# Patient Record
Sex: Female | Born: 1940 | Race: White | Hispanic: No | Marital: Single | Smoking: Former smoker
Health system: Southern US, Community
[De-identification: ages and names within clinical notes are randomized; demographics above are authoritative.]

## PROBLEM LIST (undated history)

## (undated) DIAGNOSIS — I951 Orthostatic hypotension: Secondary | ICD-10-CM

## (undated) DIAGNOSIS — E785 Hyperlipidemia, unspecified: Secondary | ICD-10-CM

## (undated) DIAGNOSIS — I10 Essential (primary) hypertension: Secondary | ICD-10-CM

## (undated) DIAGNOSIS — M7580 Other shoulder lesions, unspecified shoulder: Secondary | ICD-10-CM

## (undated) DIAGNOSIS — R42 Dizziness and giddiness: Secondary | ICD-10-CM

## (undated) DIAGNOSIS — Z923 Personal history of irradiation: Secondary | ICD-10-CM

## (undated) DIAGNOSIS — I5189 Other ill-defined heart diseases: Secondary | ICD-10-CM

## (undated) DIAGNOSIS — C4490 Unspecified malignant neoplasm of skin, unspecified: Secondary | ICD-10-CM

## (undated) DIAGNOSIS — C50919 Malignant neoplasm of unspecified site of unspecified female breast: Secondary | ICD-10-CM

## (undated) DIAGNOSIS — R55 Syncope and collapse: Secondary | ICD-10-CM

## (undated) DIAGNOSIS — Z973 Presence of spectacles and contact lenses: Secondary | ICD-10-CM

## (undated) DIAGNOSIS — M1990 Unspecified osteoarthritis, unspecified site: Secondary | ICD-10-CM

## (undated) DIAGNOSIS — N393 Stress incontinence (female) (male): Secondary | ICD-10-CM

## (undated) DIAGNOSIS — H3552 Pigmentary retinal dystrophy: Secondary | ICD-10-CM

## (undated) HISTORY — DX: Other ill-defined heart diseases: I51.89

## (undated) HISTORY — DX: Dizziness and giddiness: R42

## (undated) HISTORY — DX: Orthostatic hypotension: I95.1

## (undated) HISTORY — DX: Syncope and collapse: R55

## (undated) HISTORY — PX: OTHER SURGICAL HISTORY: SHX169

## (undated) HISTORY — DX: Hyperlipidemia, unspecified: E78.5

## (undated) HISTORY — PX: TONSILLECTOMY: SUR1361

## (undated) HISTORY — DX: Unspecified malignant neoplasm of skin, unspecified: C44.90

---

## 1983-02-15 HISTORY — PX: ABDOMINAL HYSTERECTOMY: SHX81

## 1990-02-14 HISTORY — PX: CHOLECYSTECTOMY: SHX55

## 1999-11-09 ENCOUNTER — Encounter: Payer: Self-pay | Admitting: Emergency Medicine

## 1999-11-09 ENCOUNTER — Emergency Department (HOSPITAL_COMMUNITY): Admission: EM | Admit: 1999-11-09 | Discharge: 1999-11-09 | Payer: Self-pay | Admitting: Emergency Medicine

## 2005-07-22 ENCOUNTER — Encounter: Admission: RE | Admit: 2005-07-22 | Discharge: 2005-07-22 | Payer: Self-pay | Admitting: Cardiovascular Disease

## 2006-06-06 ENCOUNTER — Other Ambulatory Visit: Admission: RE | Admit: 2006-06-06 | Discharge: 2006-06-06 | Payer: Self-pay | Admitting: Internal Medicine

## 2009-01-01 ENCOUNTER — Encounter (INDEPENDENT_AMBULATORY_CARE_PROVIDER_SITE_OTHER): Payer: Self-pay | Admitting: *Deleted

## 2009-01-05 ENCOUNTER — Ambulatory Visit: Payer: Self-pay | Admitting: Internal Medicine

## 2009-01-05 ENCOUNTER — Encounter (INDEPENDENT_AMBULATORY_CARE_PROVIDER_SITE_OTHER): Payer: Self-pay | Admitting: *Deleted

## 2009-01-14 ENCOUNTER — Ambulatory Visit: Payer: Self-pay | Admitting: Internal Medicine

## 2009-01-23 ENCOUNTER — Encounter: Payer: Self-pay | Admitting: Internal Medicine

## 2009-01-28 ENCOUNTER — Emergency Department (HOSPITAL_COMMUNITY): Admission: EM | Admit: 2009-01-28 | Discharge: 2009-01-29 | Payer: Self-pay | Admitting: Emergency Medicine

## 2009-01-28 ENCOUNTER — Telehealth: Payer: Self-pay | Admitting: Internal Medicine

## 2009-07-31 ENCOUNTER — Telehealth: Payer: Self-pay | Admitting: Internal Medicine

## 2009-11-13 ENCOUNTER — Ambulatory Visit: Payer: Self-pay | Admitting: Cardiovascular Disease

## 2010-03-16 NOTE — Progress Notes (Signed)
Summary: med ?'s  Phone Note Call from Patient Call back at Home Phone [phone]   Caller: Patient Call For: Dr. Leone Payor Reason for Call: Talk to Nurse Summary of Call: has questions regarding magnesium glycenate Initial call taken by: Vallarie Mare,  July 31, 2009 9:18 AM  Follow-up for Phone Call        Pt asking if magnesium glycenate will affect her bowels.  Has diarrhea at times.  Pt informed that meds with magnesium can cause diarrhea.  Pt instructed to read label to see if diarrhea is listed as side effect. Follow-up by: Ashok Cordia RN,  July 31, 2009 9:33 AM

## 2010-05-18 LAB — COMPREHENSIVE METABOLIC PANEL
CO2: 29 mEq/L (ref 19–32)
Calcium: 9.9 mg/dL (ref 8.4–10.5)
Creatinine, Ser: 0.7 mg/dL (ref 0.4–1.2)
GFR calc non Af Amer: >60 mL/min (ref >60)
Glucose, Bld: 124 mg/dL — ABNORMAL HIGH (ref 70–99)
Total Bilirubin: 0.7 mg/dL (ref 0.3–1.2)

## 2010-05-18 LAB — DIFFERENTIAL
Basophils Absolute: 0.1 10*3/uL (ref 0.0–0.1)
Basophils Relative: 0 % (ref 0–1)
Eosinophils Relative: 1 % (ref 0–5)
Lymphocytes Relative: 15 % (ref 12–46)
Monocytes Absolute: 1 10*3/uL (ref 0.1–1.0)

## 2010-05-18 LAB — URINALYSIS, ROUTINE W REFLEX MICROSCOPIC
Bilirubin Urine: NEGATIVE
Ketones, ur: NEGATIVE mg/dL
Nitrite: NEGATIVE
Protein, ur: NEGATIVE mg/dL
Specific Gravity, Urine: 1.02 (ref 1.005–1.030)
Urobilinogen, UA: 0.2 mg/dL (ref 0.0–1.0)

## 2010-05-18 LAB — CBC
HCT: 42.4 % (ref 36.0–46.0)
Hemoglobin: 14.3 g/dL (ref 12.0–15.0)
MCHC: 33.8 g/dL (ref 30.0–36.0)
MCV: 92.1 fL (ref 78.0–100.0)
RBC: 4.6 MIL/uL (ref 3.87–5.11)

## 2010-05-18 LAB — URINE CULTURE: Culture: NO GROWTH

## 2010-05-18 LAB — GLUCOSE, CAPILLARY
Glucose-Capillary: 119 mg/dL — ABNORMAL HIGH (ref 70–99)
Glucose-Capillary: 80 mg/dL (ref 70–99)

## 2010-11-09 ENCOUNTER — Encounter: Payer: Self-pay | Admitting: Cardiovascular Disease

## 2010-11-10 ENCOUNTER — Encounter: Payer: Self-pay | Admitting: Cardiovascular Disease

## 2010-11-23 ENCOUNTER — Ambulatory Visit: Payer: Self-pay | Admitting: Cardiovascular Disease

## 2010-12-31 ENCOUNTER — Encounter: Payer: Self-pay | Admitting: Cardiovascular Disease

## 2010-12-31 ENCOUNTER — Ambulatory Visit (INDEPENDENT_AMBULATORY_CARE_PROVIDER_SITE_OTHER): Payer: Self-pay | Admitting: Cardiovascular Disease

## 2010-12-31 VITALS — BP 144/85 | HR 81 | Ht 66.0 in | Wt 204.8 lb

## 2010-12-31 DIAGNOSIS — I5030 Unspecified diastolic (congestive) heart failure: Secondary | ICD-10-CM

## 2010-12-31 DIAGNOSIS — I509 Heart failure, unspecified: Secondary | ICD-10-CM

## 2010-12-31 DIAGNOSIS — I5032 Chronic diastolic (congestive) heart failure: Secondary | ICD-10-CM | POA: Insufficient documentation

## 2010-12-31 NOTE — Patient Instructions (Signed)
Your physician wants you to follow-up in: 1 year, You will receive a reminder letter in the mail two months in advance. If you don't receive a letter, please call our office to schedule the follow-up appointment. 

## 2010-12-31 NOTE — Progress Notes (Signed)
Angela Rosario Date of Birth  1940-12-17 [facility] [address]    [address] [place], [place]  [address] [phone]  Fax  [phone]  History of Present Illness:  Angela Rosario is a 70 year old female with a history of diastolic dysfunction and diabetes mellitus. She's had some episodes of orthostatic hypotension.  She'll begin a getting a hip replacement in January.  She has not been able to exercise because of her hip but otherwise she seems to be doing fairly well. She denies any chest pain or shortness of breath.    Current Outpatient Prescriptions on File Prior to Visit  Medication Sig Dispense Refill  . carvedilol (COREG) 6.25 MG tablet Take 6.25 mg by mouth 2 (two) times daily with a meal.        . glipiZIDE-metformin (METAGLIP) 2.5-500 MG per tablet Take 1 tablet by mouth daily.        . Multiple Vitamin (MULTI-VITAMIN PO) Take by mouth daily.        . Omega-3 Fatty Acids (FISH OIL PO) Take by mouth 2 (two) times daily.          No Known Allergies  Past Medical History  Diagnosis Date  . Syncope   . Diastolic dysfunction   . Diabetes mellitus   . Hyperlipidemia   . Episode of dizziness     Mild episodes  . Orthostatic hypotension     Some episodes    Past Surgical History  Procedure Date  . Other surgical history     Hysterectomy  . Cholecystectomy     History  Smoking status  . Never Smoker   Smokeless tobacco  . Not on file    History  Alcohol Use No    No family history on file.  Reviw of Systems:  Reviewed in the HPI.  All other systems are negative.  Physical Exam: BP 144/85  Pulse 81  Ht 5\' 6"  (1.676 m)  Wt 204 lb 12.8 oz (92.897 kg)  BMI 33.06 kg/m2 The patient is alert and oriented x 3.  The mood and affect are normal.   Skin: warm and dry.  Color is normal.    HEENT:   [place]/AT, no JVD  Lungs: clear   Heart: RR,    Abdomen: soft, + BS  Extremities:  No c/c/e  Neuro:  Non focal    ECG: NSR, rare PVCs, left ant.  fascicular block  Assessment / Plan:

## 2010-12-31 NOTE — Assessment & Plan Note (Signed)
Angela Rosario has done very well from a cardiac standpoint. She has known diastolic dysfunction but has been doing very well. Her blood pressures been well controlled. She will remain on the carvedilol.  She's scheduled to have hip surgery in January. She is at low risk for any cardiovascular consultations during her hip surgery. We will not need to see her again prior to hip surgery unless she has additional issues.

## 2011-01-28 ENCOUNTER — Other Ambulatory Visit: Payer: Self-pay | Admitting: Orthopedic Surgery

## 2011-01-28 NOTE — H&P (Signed)
Angela Rosario  DOB: 01/15/1941  Date Of Admission: 02/22/2010  Chief Complaint:  Left Hip Pain  History of Present Illness The patient is a 70 year old female who comes in today for a preoperative History and Physical. The patient is scheduled for a left total hip arthroplasty to be performed by Dr. Gus Rankin. Aluisio, MD at [facility] on 02/23/2011. Angela Rosario is a 70 year old female in today for evaluation of her left hip. She saw Dr. Ranell Patrick about a year and a half ago and was told that she had hip arthritis at that time. She states that the hip has gotten progressively worse over time. The pain is just in the left hip. She does not have any right hip pain. The pain is in her groin radiating down to her thigh to her knee. She is not having any back pain with this. She is not having lower extremity weakness or paresthesia. She does not have any right hip pain at this time. The hip is definitely limiting what she can and cannot do. It is hurting her at night. It has gotten progressively worse over the past several months. She would like to procede with total hip replacement. They have been treated conservatively in the past for the above stated problem and despite conservative measures, they continue to have progressive pain and severe functional limitations and dysfunction. They have failed non-operative management. It is felt that they would benefit from undergoing total joint replacement. Risks and benefits of the procedure have been discussed with the patient and they elect to proceed with surgery. There are no active contraindications to surgery such as ongoing infection or rapidly progressive neurological disease.  Allergies No Known Drug Allergies.   Medications Alpha-Lipoic Acid 600 MG CAPS carvedilol (COREG) 6.25 MG table  Coenzyme Q10 (CO Q10) 100 MG TABS  Fesoterodine Fumarate (TOVIAZ) 8 MG TB24  glipiZIDE-metformin (METAGLIP) 2.5-500 MG per tablet  Glucosamine-Chondroit-Vit C-Mn (GLUCOSAMINE-CHONDROITIN) TABS  lovastatin (MEVACOR) 10 MG tablet  meloxicam (MOBIC) 15 MG tablet  Multiple Vitamin (MULTI-VITAMIN PO)  Omega-3 Fatty Acids (FISH OIL PO)  Past Medical Urinary Incontinence Diabetes Mellitus, Type II Diverticulitis Of Colon Hypercholesterolemia Skin Cancer  Past Surgical History Gallbladder Surgery. Date: 43. laporoscopic Hysterectomy. Date: 2. partial (non-cancerous)  Family History Diabetes Mellitus. mother Osteoarthritis. mother Osteoporosis. mother  Social History Alcohol use. never consumed alcohol Children. 2 Current work status. retired Financial planner (Currently). no Drug/Alcohol Rehab (Previously). no Exercise. Exercises daily; does running / walking Illicit drug use. no Living situation. live alone Marital status. divorced Number of flights of stairs before winded. 2-3 Pain Contract. no Tobacco / smoke exposure. no Tobacco use. never smoker Post-Surgical Plans. Wants to look into [facility]. Advance Directives. Living Will and Healthcare POA  Review of Systems General:Not Present- Chills, Fever, Night Sweats, Appetite Loss, Fatigue, Feeling sick, Weight Gain and Weight Loss. Skin:Not Present- Itching, Rash, Skin Color Changes, Ulcer, Psoriasis and Change in Hair or Nails. HEENT:Not Present- Sensitivity to light, Hearing problems, Nose Bleed and Ringing in the Ears. Neck:Not Present- Swollen Glands and Neck Mass. Respiratory:Not Present- Snoring, Chronic Cough, Bloody sputum and Dyspnea. Cardiovascular:Not Present- Shortness of Breath, Chest Pain, Swelling of Extremities, Leg Cramps and Palpitations. Gastrointestinal:Not Present- Bloody Stool, Heartburn, Abdominal Pain, Vomiting, Nausea and Incontinence of Stool. Female Genitourinary:Not Present- Blood in Urine, Menstrual Irregularities, Frequency, Incontinence and Nocturia. Musculoskeletal:Not Present-  Muscle Weakness, Muscle Pain, Joint Stiffness, Joint Swelling, Joint Pain and Back Pain. Neurological:Not Present- Tingling, Numbness, Burning, Tremor, Headaches  and Dizziness. Psychiatric:Not Present- Anxiety, Depression and Memory Loss. Endocrine:Not Present- Cold Intolerance, Heat Intolerance, Excessive hunger and Excessive Thirst. Hematology:Not Present- Abnormal Bleeding, Anemia, Blood Clots and Easy Bruising.   Vitals Weight: 200 lb Height: 66 in Body Surface Area: 2.06 m Body Mass Index: 32.28 kg/m Pulse: 72 (Regular) Resp.: 14 (Unlabored) BP: 142/82 (Sitting, Left Arm, Standard)  Physical Exam The physical exam findings are as follows:  General Mental Status - Alert, cooperative and good historian. General Appearance- pleasant. Not in acute distress. Orientation- Oriented X3. Build & Nutrition- Well nourished and Well developed.  Head and Neck Head- normocephalic, atraumatic . Neck Global Assessment- supple. no bruit auscultated on the right and no bruit auscultated on the left.  Eye Pupil- Bilateral- Regular and Round. Motion- Bilateral- EOMI.  Chest and Lung Exam Auscultation: Breath sounds:- clear at anterior chest wall and - clear at posterior chest wall. Adventitious sounds:- No Adventitious sounds.  Cardiovascular Auscultation:Rhythm- Regular rate and rhythm. Heart Sounds- S1 WNL and S2 WNL. Murmurs & Other Heart Sounds:Auscultation of the heart reveals - No Murmurs.  Abdomen Palpation/Percussion:Tenderness- Abdomen is non-tender to palpation. Rigidity (guarding)- Abdomen is soft. Auscultation:Auscultation of the abdomen reveals - Bowel sounds normal.   Female Genitourinary Not done, not pertinent to present illness  Musculoskeletal Evaluation of her right hip normal range of motion and no discomfort. Left hip flexion about 90. No internal rotation about 10 degrees external rotation, 20 degrees  abduction.  RADIOGRAPHS: AP pelvis and lateral of the left hip show that the right hip has no evidence of arthritis. Left hip has pretty significant joint space narrowing. She has large osteophyte formation. She is very close to completely bone on bone but not fully there.  Assessment & Plan Osteoarthritis Left Hip  Note: Patient is for a Left Total Hip Replacement by Dr. Lequita Halt.  Patient wants to look into [facility] after the hospital stay.  Avel Peace, PA-C

## 2011-02-04 ENCOUNTER — Encounter (HOSPITAL_COMMUNITY): Payer: Self-pay

## 2011-02-17 ENCOUNTER — Encounter (HOSPITAL_COMMUNITY): Payer: Self-pay

## 2011-02-17 ENCOUNTER — Ambulatory Visit (HOSPITAL_COMMUNITY)
Admission: RE | Admit: 2011-02-17 | Discharge: 2011-02-17 | Disposition: A | Discharge status: Home / Self Care | Payer: Medicare Other | Source: Ambulatory Visit | Attending: Orthopedic Surgery | Admitting: Orthopedic Surgery

## 2011-02-17 ENCOUNTER — Encounter (HOSPITAL_COMMUNITY)
Admission: RE | Admit: 2011-02-17 | Discharge: 2011-02-17 | Disposition: A | Discharge status: Home / Self Care | Payer: Medicare Other | Source: Ambulatory Visit | Attending: Orthopedic Surgery | Admitting: Orthopedic Surgery

## 2011-02-17 DIAGNOSIS — M169 Osteoarthritis of hip, unspecified: Secondary | ICD-10-CM | POA: Insufficient documentation

## 2011-02-17 DIAGNOSIS — Z01818 Encounter for other preprocedural examination: Secondary | ICD-10-CM | POA: Insufficient documentation

## 2011-02-17 DIAGNOSIS — Z01812 Encounter for preprocedural laboratory examination: Secondary | ICD-10-CM | POA: Insufficient documentation

## 2011-02-17 DIAGNOSIS — M1610 Unilateral primary osteoarthritis, unspecified hip: Secondary | ICD-10-CM | POA: Insufficient documentation

## 2011-02-17 HISTORY — DX: Unspecified osteoarthritis, unspecified site: M19.90

## 2011-02-17 HISTORY — DX: Pigmentary retinal dystrophy: H35.52

## 2011-02-17 HISTORY — DX: Stress incontinence (female) (male): N39.3

## 2011-02-17 HISTORY — DX: Other shoulder lesions, unspecified shoulder: M75.80

## 2011-02-17 LAB — COMPREHENSIVE METABOLIC PANEL
AST: 26 U/L (ref 0–37)
Alkaline Phosphatase: 77 U/L (ref 39–117)
CO2: 28 mEq/L (ref 19–32)
Chloride: 101 mEq/L (ref 96–112)
Creatinine, Ser: 0.78 mg/dL (ref 0.50–1.10)
GFR calc non Af Amer: 82 mL/min — ABNORMAL LOW (ref >90)
Potassium: 4 mEq/L (ref 3.5–5.1)
Total Bilirubin: 0.5 mg/dL (ref 0.3–1.2)

## 2011-02-17 LAB — URINALYSIS, ROUTINE W REFLEX MICROSCOPIC
Bilirubin Urine: NEGATIVE
Glucose, UA: NEGATIVE mg/dL
Hgb urine dipstick: NEGATIVE
Specific Gravity, Urine: 1.025 (ref 1.005–1.030)
Urobilinogen, UA: 1 mg/dL (ref 0.0–1.0)
pH: 6.5 (ref 5.0–8.0)

## 2011-02-17 LAB — CBC
HCT: 42.8 % (ref 36.0–46.0)
MCV: 91.1 fL (ref 78.0–100.0)
Platelets: 288 10*3/uL (ref 150–400)
RBC: 4.7 MIL/uL (ref 3.87–5.11)
WBC: 7.8 10*3/uL (ref 4.0–10.5)

## 2011-02-17 LAB — DIFFERENTIAL
Lymphocytes Relative: 31 % (ref 12–46)
Lymphs Abs: 2.5 10*3/uL (ref 0.7–4.0)
Monocytes Absolute: 0.7 10*3/uL (ref 0.1–1.0)
Monocytes Relative: 8 % (ref 3–12)
Neutro Abs: 4.5 10*3/uL (ref 1.7–7.7)
Neutrophils Relative %: 57 % (ref 43–77)

## 2011-02-17 LAB — URINE MICROSCOPIC-ADD ON

## 2011-02-17 LAB — PROTIME-INR: INR: 0.94 (ref 0.00–1.49)

## 2011-02-17 LAB — SURGICAL PCR SCREEN: MRSA, PCR: NEGATIVE

## 2011-02-17 NOTE — Patient Instructions (Addendum)
20 Jovi Alvizo  02/17/2011   Your procedure is scheduled on:  02-23-2011  Report to [facility] at 0630 AM.  Call this number if you have problems the morning of surgery: [phone]   Remember:   Do not eat food:After Midnight.  May have clear liquids:until Midnight .  Marland Kitchen  Take these medicines the morning of surgery with A SIP OF WATER: cardvedilol, es tylenol if needed   Do not wear jewelry, make-up or nail polish.  Do not wear lotions, powders, or perfumes.   do not bring valuables to the hospital.  Contacts, dentures or bridgework may not be worn into surgery.  Leave suitcase in the car. After surgery it may be brought to your room.  For patients admitted to the hospital, checkout time is 11:00 AM the day of discharge.     Special Instructions: hibiclens shower night before and morning of surgery, use from neck down avoid private area, no shaving 2 days before showers Cain Sieve, rn [facility] pre op nurse phone number [phone]  Please read over the following fact sheets that you were given: MRSA Information, blood fact sheet

## 2011-02-17 NOTE — Pre-Procedure Instructions (Signed)
ekg and  Cardiac clearance note dr Melburn Popper 12-31-2010 in epic

## 2011-02-22 MED ORDER — BUPIVACAINE 0.25 % ON-Q PUMP SINGLE CATH 300ML
300.0000 mL | INJECTION | Status: DC
  Filled 2011-02-22: qty 300

## 2011-02-23 ENCOUNTER — Inpatient Hospital Stay (HOSPITAL_COMMUNITY): Payer: Medicare Other | Admitting: Anesthesiology

## 2011-02-23 ENCOUNTER — Encounter (HOSPITAL_COMMUNITY): Payer: Self-pay | Admitting: *Deleted

## 2011-02-23 ENCOUNTER — Inpatient Hospital Stay (HOSPITAL_COMMUNITY): Payer: Medicare Other

## 2011-02-23 ENCOUNTER — Encounter (HOSPITAL_COMMUNITY)
Admission: RE | Disposition: A | Discharge status: Skilled Nursing Facility | Payer: Self-pay | Source: Ambulatory Visit | Attending: Orthopedic Surgery

## 2011-02-23 ENCOUNTER — Inpatient Hospital Stay (HOSPITAL_COMMUNITY)
Admission: RE | Admit: 2011-02-23 | Discharge: 2011-02-26 | DRG: 470 | Disposition: A | Discharge status: Skilled Nursing Facility | Payer: Medicare Other | Source: Ambulatory Visit | Attending: Orthopedic Surgery | Admitting: Orthopedic Surgery

## 2011-02-23 ENCOUNTER — Encounter (HOSPITAL_COMMUNITY): Payer: Self-pay | Admitting: Anesthesiology

## 2011-02-23 DIAGNOSIS — Z96649 Presence of unspecified artificial hip joint: Secondary | ICD-10-CM

## 2011-02-23 DIAGNOSIS — M1610 Unilateral primary osteoarthritis, unspecified hip: Principal | ICD-10-CM | POA: Diagnosis present

## 2011-02-23 DIAGNOSIS — E119 Type 2 diabetes mellitus without complications: Secondary | ICD-10-CM | POA: Diagnosis present

## 2011-02-23 DIAGNOSIS — M169 Osteoarthritis of hip, unspecified: Secondary | ICD-10-CM | POA: Diagnosis present

## 2011-02-23 DIAGNOSIS — E871 Hypo-osmolality and hyponatremia: Secondary | ICD-10-CM | POA: Diagnosis not present

## 2011-02-23 HISTORY — PX: TOTAL HIP ARTHROPLASTY: SHX124

## 2011-02-23 LAB — TYPE AND SCREEN
ABO/RH(D): O POS
Antibody Screen: NEGATIVE

## 2011-02-23 LAB — GLUCOSE, CAPILLARY
Glucose-Capillary: 204 mg/dL — ABNORMAL HIGH (ref 70–99)
Glucose-Capillary: 218 mg/dL — ABNORMAL HIGH (ref 70–99)

## 2011-02-23 SURGERY — ARTHROPLASTY, HIP, TOTAL,POSTERIOR APPROACH
Anesthesia: General | Site: Hip | Laterality: Left | Wound class: Clean

## 2011-02-23 MED ORDER — SODIUM CHLORIDE 0.9 % IV SOLN
INTRAVENOUS | Status: DC
  Administered 2011-02-23 – 2011-02-24 (×2): via INTRAVENOUS

## 2011-02-23 MED ORDER — METOCLOPRAMIDE HCL 5 MG/ML IJ SOLN: 5.0000 mg | Freq: Three times a day (TID) | INTRAMUSCULAR | Status: DC | PRN

## 2011-02-23 MED ORDER — HYDROMORPHONE HCL PF 1 MG/ML IJ SOLN
0.2500 mg | INTRAMUSCULAR | Status: DC | PRN
  Administered 2011-02-23 (×3): 0.5 mg via INTRAVENOUS

## 2011-02-23 MED ORDER — POLYETHYLENE GLYCOL 3350 17 G PO PACK
17.0000 g | PACK | Freq: Every day | ORAL | Status: DC | PRN
  Filled 2011-02-23: qty 1

## 2011-02-23 MED ORDER — ACETAMINOPHEN 10 MG/ML IV SOLN
1000.0000 mg | Freq: Four times a day (QID) | INTRAVENOUS | Status: AC
  Administered 2011-02-23 – 2011-02-24 (×4): 1000 mg via INTRAVENOUS
  Filled 2011-02-23 (×4): qty 100

## 2011-02-23 MED ORDER — METOCLOPRAMIDE HCL 10 MG PO TABS: 5.0000 mg | ORAL_TABLET | Freq: Three times a day (TID) | ORAL | Status: DC | PRN

## 2011-02-23 MED ORDER — TEMAZEPAM 15 MG PO CAPS: 15.0000 mg | ORAL_CAPSULE | Freq: Every evening | ORAL | Status: DC | PRN

## 2011-02-23 MED ORDER — DIPHENHYDRAMINE HCL 12.5 MG/5ML PO ELIX: 12.5000 mg | ORAL_SOLUTION | ORAL | Status: DC | PRN

## 2011-02-23 MED ORDER — PROMETHAZINE HCL 25 MG/ML IJ SOLN: 6.2500 mg | INTRAMUSCULAR | Status: DC | PRN

## 2011-02-23 MED ORDER — BUPIVACAINE LIPOSOME 1.3 % IJ SUSP
20.0000 mL | Freq: Once | INTRAMUSCULAR | Status: AC
  Administered 2011-02-23: 20 mL
  Filled 2011-02-23: qty 20

## 2011-02-23 MED ORDER — FLEET ENEMA 7-19 GM/118ML RE ENEM: 1.0000 | ENEMA | Freq: Once | RECTAL | Status: AC | PRN

## 2011-02-23 MED ORDER — ONDANSETRON HCL 4 MG PO TABS: 4.0000 mg | ORAL_TABLET | Freq: Four times a day (QID) | ORAL | Status: DC | PRN

## 2011-02-23 MED ORDER — ACETAMINOPHEN 10 MG/ML IV SOLN
INTRAVENOUS | Status: DC | PRN
  Administered 2011-02-23: 1000 mg via INTRAVENOUS

## 2011-02-23 MED ORDER — FENTANYL CITRATE 0.05 MG/ML IJ SOLN
INTRAMUSCULAR | Status: DC | PRN
  Administered 2011-02-23: 50 ug via INTRAVENOUS
  Administered 2011-02-23: 100 ug via INTRAVENOUS
  Administered 2011-02-23 (×2): 50 ug via INTRAVENOUS

## 2011-02-23 MED ORDER — HYDROMORPHONE HCL PF 1 MG/ML IJ SOLN
INTRAMUSCULAR | Status: AC
  Filled 2011-02-23: qty 1

## 2011-02-23 MED ORDER — SIMVASTATIN 5 MG PO TABS
5.0000 mg | ORAL_TABLET | Freq: Every day | ORAL | Status: DC
  Administered 2011-02-23 – 2011-02-24 (×2): 5 mg via ORAL
  Filled 2011-02-23 (×4): qty 1

## 2011-02-23 MED ORDER — MORPHINE SULFATE 2 MG/ML IJ SOLN
1.0000 mg | INTRAMUSCULAR | Status: DC | PRN
  Administered 2011-02-23 – 2011-02-25 (×2): 2 mg via INTRAVENOUS
  Filled 2011-02-23 (×2): qty 1

## 2011-02-23 MED ORDER — MIDAZOLAM HCL 5 MG/5ML IJ SOLN
INTRAMUSCULAR | Status: DC | PRN
  Administered 2011-02-23: 2 mg via INTRAVENOUS

## 2011-02-23 MED ORDER — ONDANSETRON HCL 4 MG/2ML IJ SOLN
INTRAMUSCULAR | Status: DC | PRN
  Administered 2011-02-23: 4 mg via INTRAVENOUS

## 2011-02-23 MED ORDER — METHOCARBAMOL 500 MG PO TABS
500.0000 mg | ORAL_TABLET | Freq: Four times a day (QID) | ORAL | Status: DC | PRN
  Administered 2011-02-23 – 2011-02-26 (×8): 500 mg via ORAL
  Filled 2011-02-23 (×8): qty 1

## 2011-02-23 MED ORDER — SUCCINYLCHOLINE CHLORIDE 20 MG/ML IJ SOLN
INTRAMUSCULAR | Status: DC | PRN
  Administered 2011-02-23: 100 mg via INTRAVENOUS

## 2011-02-23 MED ORDER — CARVEDILOL 6.25 MG PO TABS
6.2500 mg | ORAL_TABLET | Freq: Two times a day (BID) | ORAL | Status: DC
  Administered 2011-02-23 – 2011-02-26 (×5): 6.25 mg via ORAL
  Filled 2011-02-23 (×7): qty 1

## 2011-02-23 MED ORDER — METHOCARBAMOL 100 MG/ML IJ SOLN
500.0000 mg | Freq: Four times a day (QID) | INTRAMUSCULAR | Status: DC | PRN
  Administered 2011-02-23: 500 mg via INTRAVENOUS
  Filled 2011-02-23: qty 5

## 2011-02-23 MED ORDER — PHENOL 1.4 % MT LIQD: 1.0000 | OROMUCOSAL | Status: DC | PRN

## 2011-02-23 MED ORDER — INSULIN ASPART 100 UNIT/ML ~~LOC~~ SOLN
SUBCUTANEOUS | Status: AC
  Administered 2011-02-23: 5 [IU] via SUBCUTANEOUS
  Filled 2011-02-23: qty 1

## 2011-02-23 MED ORDER — CARVEDILOL 6.25 MG PO TABS
6.2500 mg | ORAL_TABLET | ORAL | Status: AC
  Administered 2011-02-23: 6.25 mg via ORAL
  Filled 2011-02-23: qty 1

## 2011-02-23 MED ORDER — INSULIN ASPART 100 UNIT/ML ~~LOC~~ SOLN: 0.0000 [IU] | SUBCUTANEOUS | Status: DC

## 2011-02-23 MED ORDER — OXYCODONE HCL 5 MG PO TABS
5.0000 mg | ORAL_TABLET | ORAL | Status: DC | PRN
  Administered 2011-02-23: 5 mg via ORAL
  Administered 2011-02-24: 10 mg via ORAL
  Administered 2011-02-24: 5 mg via ORAL
  Administered 2011-02-24 (×2): 10 mg via ORAL
  Filled 2011-02-23: qty 1
  Filled 2011-02-23: qty 2
  Filled 2011-02-23 (×3): qty 1
  Filled 2011-02-23: qty 2

## 2011-02-23 MED ORDER — BISACODYL 10 MG RE SUPP: 10.0000 mg | Freq: Every day | RECTAL | Status: DC | PRN

## 2011-02-23 MED ORDER — GLIPIZIDE 2.5 MG HALF TABLET
2.5000 mg | ORAL_TABLET | Freq: Every day | ORAL | Status: DC
  Administered 2011-02-24 – 2011-02-26 (×3): 2.5 mg via ORAL
  Filled 2011-02-23 (×3): qty 1

## 2011-02-23 MED ORDER — ACETAMINOPHEN 325 MG PO TABS
650.0000 mg | ORAL_TABLET | Freq: Four times a day (QID) | ORAL | Status: DC | PRN
  Administered 2011-02-25: 650 mg via ORAL
  Filled 2011-02-23: qty 2

## 2011-02-23 MED ORDER — CEFAZOLIN SODIUM 1-5 GM-% IV SOLN
1.0000 g | Freq: Four times a day (QID) | INTRAVENOUS | Status: AC
  Administered 2011-02-23 – 2011-02-24 (×3): 1 g via INTRAVENOUS
  Filled 2011-02-23 (×3): qty 50

## 2011-02-23 MED ORDER — GLIPIZIDE-METFORMIN HCL 2.5-500 MG PO TABS: 1.0000 | ORAL_TABLET | ORAL | Status: DC

## 2011-02-23 MED ORDER — ROCURONIUM BROMIDE 100 MG/10ML IV SOLN
INTRAVENOUS | Status: DC | PRN
  Administered 2011-02-23: 10 mg via INTRAVENOUS

## 2011-02-23 MED ORDER — MENTHOL 3 MG MT LOZG: 1.0000 | LOZENGE | OROMUCOSAL | Status: DC | PRN

## 2011-02-23 MED ORDER — METFORMIN HCL 500 MG PO TABS
500.0000 mg | ORAL_TABLET | Freq: Every day | ORAL | Status: DC
  Administered 2011-02-24: 500 mg via ORAL
  Filled 2011-02-23: qty 1

## 2011-02-23 MED ORDER — CEFAZOLIN SODIUM-DEXTROSE 2-3 GM-% IV SOLR
2.0000 g | Freq: Once | INTRAVENOUS | Status: AC
  Administered 2011-02-23: 2 g via INTRAVENOUS

## 2011-02-23 MED ORDER — RIVAROXABAN 10 MG PO TABS
10.0000 mg | ORAL_TABLET | Freq: Every day | ORAL | Status: DC
  Administered 2011-02-24 – 2011-02-26 (×3): 10 mg via ORAL
  Filled 2011-02-23 (×3): qty 1

## 2011-02-23 MED ORDER — ACETAMINOPHEN 650 MG RE SUPP: 650.0000 mg | Freq: Four times a day (QID) | RECTAL | Status: DC | PRN

## 2011-02-23 MED ORDER — ONDANSETRON HCL 4 MG/2ML IJ SOLN: 4.0000 mg | Freq: Four times a day (QID) | INTRAMUSCULAR | Status: DC | PRN

## 2011-02-23 MED ORDER — LACTATED RINGERS IV SOLN
INTRAVENOUS | Status: DC | PRN
  Administered 2011-02-23 (×2): via INTRAVENOUS

## 2011-02-23 MED ORDER — DOCUSATE SODIUM 100 MG PO CAPS
100.0000 mg | ORAL_CAPSULE | Freq: Two times a day (BID) | ORAL | Status: DC
  Administered 2011-02-24 – 2011-02-26 (×4): 100 mg via ORAL
  Filled 2011-02-23 (×8): qty 1

## 2011-02-23 MED ORDER — INSULIN ASPART 100 UNIT/ML ~~LOC~~ SOLN
0.0000 [IU] | Freq: Three times a day (TID) | SUBCUTANEOUS | Status: DC
  Administered 2011-02-23 – 2011-02-24 (×2): 5 [IU] via SUBCUTANEOUS
  Administered 2011-02-24: 3 [IU] via SUBCUTANEOUS
  Administered 2011-02-24: 2 [IU] via SUBCUTANEOUS
  Administered 2011-02-25: 5 [IU] via SUBCUTANEOUS
  Administered 2011-02-25 – 2011-02-26 (×3): 3 [IU] via SUBCUTANEOUS
  Filled 2011-02-23: qty 3

## 2011-02-23 MED ORDER — PROPOFOL 10 MG/ML IV BOLUS
INTRAVENOUS | Status: DC | PRN
  Administered 2011-02-23: 120 mg via INTRAVENOUS

## 2011-02-23 SURGICAL SUPPLY — 51 items
BAG SPEC THK2 15X12 ZIP CLS (MISCELLANEOUS) ×1
BAG ZIPLOCK 12X15 (MISCELLANEOUS) ×2 IMPLANT
BIT DRILL 2.8X128 (BIT) ×2 IMPLANT
BLADE EXTENDED COATED 6.5IN (ELECTRODE) ×2 IMPLANT
BLADE SAW SAG 73X25 THK (BLADE) ×1
BLADE SAW SGTL 73X25 THK (BLADE) ×1 IMPLANT
CLOSURE STERI STRIP 1/2 X4 (GAUZE/BANDAGES/DRESSINGS) ×1 IMPLANT
CLOTH BEACON ORANGE TIMEOUT ST (SAFETY) ×2 IMPLANT
DECANTER SPIKE VIAL GLASS SM (MISCELLANEOUS) ×2 IMPLANT
DRAPE INCISE IOBAN 66X45 STRL (DRAPES) ×2 IMPLANT
DRAPE ORTHO SPLIT 77X108 STRL (DRAPES) ×4
DRAPE POUCH INSTRU U-SHP 10X18 (DRAPES) ×2 IMPLANT
DRAPE SURG ORHT 6 SPLT 77X108 (DRAPES) ×2 IMPLANT
DRAPE U-SHAPE 47X51 STRL (DRAPES) ×2 IMPLANT
DRSG ADAPTIC 3X8 NADH LF (GAUZE/BANDAGES/DRESSINGS) ×2 IMPLANT
DRSG MEPILEX BORDER 4X4 (GAUZE/BANDAGES/DRESSINGS) ×2 IMPLANT
DRSG MEPILEX BORDER 4X8 (GAUZE/BANDAGES/DRESSINGS) ×2 IMPLANT
DURAPREP 26ML APPLICATOR (WOUND CARE) ×2 IMPLANT
ELECT REM PT RETURN 9FT ADLT (ELECTROSURGICAL) ×2
ELECTRODE REM PT RTRN 9FT ADLT (ELECTROSURGICAL) ×1 IMPLANT
EVACUATOR 1/8 PVC DRAIN (DRAIN) ×2 IMPLANT
FACESHIELD LNG OPTICON STERILE (SAFETY) ×8 IMPLANT
GLOVE BIO SURGEON STRL SZ7.5 (GLOVE) ×2 IMPLANT
GLOVE BIO SURGEON STRL SZ8 (GLOVE) ×2 IMPLANT
GLOVE BIOGEL PI IND STRL 8 (GLOVE) ×2 IMPLANT
GLOVE BIOGEL PI INDICATOR 8 (GLOVE) ×2
GOWN STRL NON-REIN LRG LVL3 (GOWN DISPOSABLE) ×2 IMPLANT
GOWN STRL REIN XL XLG (GOWN DISPOSABLE) ×2 IMPLANT
IMMOBILIZER KNEE 20 (SOFTGOODS) ×2
IMMOBILIZER KNEE 20 THIGH 36 (SOFTGOODS) IMPLANT
KIT BASIN OR (CUSTOM PROCEDURE TRAY) ×2 IMPLANT
MANIFOLD NEPTUNE II (INSTRUMENTS) ×2 IMPLANT
NDL SAFETY ECLIPSE 18X1.5 (NEEDLE) ×1 IMPLANT
NEEDLE HYPO 18GX1.5 SHARP (NEEDLE) ×2
NS IRRIG 1000ML POUR BTL (IV SOLUTION) ×2 IMPLANT
PACK TOTAL JOINT (CUSTOM PROCEDURE TRAY) ×2 IMPLANT
PASSER SUT SWANSON 36MM LOOP (INSTRUMENTS) ×2 IMPLANT
POSITIONER SURGICAL ARM (MISCELLANEOUS) ×2 IMPLANT
SPONGE GAUZE 4X4 12PLY (GAUZE/BANDAGES/DRESSINGS) ×2 IMPLANT
STRIP CLOSURE SKIN 1/2X4 (GAUZE/BANDAGES/DRESSINGS) ×4 IMPLANT
SUT ETHIBOND NAB CT1 #1 30IN (SUTURE) ×4 IMPLANT
SUT MNCRL AB 4-0 PS2 18 (SUTURE) ×2 IMPLANT
SUT VIC AB 1 CT1 27 (SUTURE) ×6
SUT VIC AB 1 CT1 27XBRD ANTBC (SUTURE) ×3 IMPLANT
SUT VIC AB 2-0 CT1 27 (SUTURE) ×6
SUT VIC AB 2-0 CT1 TAPERPNT 27 (SUTURE) ×3 IMPLANT
SYR 50ML LL SCALE MARK (SYRINGE) ×2 IMPLANT
TOWEL OR 17X26 10 PK STRL BLUE (TOWEL DISPOSABLE) ×4 IMPLANT
TOWEL OR NON WOVEN STRL DISP B (DISPOSABLE) ×2 IMPLANT
TRAY FOLEY CATH 14FRSI W/METER (CATHETERS) ×2 IMPLANT
WATER STERILE IRR 1500ML POUR (IV SOLUTION) ×2 IMPLANT

## 2011-02-23 NOTE — Op Note (Signed)
Pre-operative diagnosis- Osteoarthritis Left hip  Post-operative diagnosis- Osteoarthritis  Left hip  Procedure-  LeftTotal Hip Arthroplasty  Surgeon- Gus Rankin. Adnan Vanvoorhis, MD  Assistant- Avel Peace, PA-C   Anesthesia  General  EBL- 500   Drain Hemovac   Complication- None  Condition-PACU - hemodynamically stable.   Brief Clinical Note-  Angela Rosario is a 71 y.o. female with end stage arthritis of her left hip with progressively worsening pain and dysfunction. Pain occurs with activity and rest including pain at night. She has tried analgesics, protected weight bearing and rest without benefit. Pain is too severe to attempt physical therapy. Radiographs demonstrate bone on bone arthritis with subchondral cyst formation. She presents now for left THA.  Procedure in detail-   The patient is brought into the operating room and placed on the operating table. After successful administration of General  anesthesia, the patient is placed in the  Right lateral decubitus position with the  Left side up and held in place with the hip positioner. The lower extremity is isolated from the perineum with plastic drapes and time-out is performed by the surgical team. The lower extremity is then prepped and draped in the usual sterile fashion. A short posterolateral incision is made with a ten blade through the subcutaneous tissue to the level of the fascia lata which is incised in line with the skin incision. The sciatic nerve is palpated and protected and the short external rotators and capsule are isolated from the femur. The hip is then dislocated and the center of the femoral head is marked. A trial prosthesis is placed such that the trial head corresponds to the center of the patients' native femoral head. The resection level is marked on the femoral neck and the resection is made with an oscillating saw. The femoral head is removed and femoral retractors placed to gain access to the femoral canal.    The canal finder is passed into the femoral canal and the canal is thoroughly irrigated with sterile saline to remove the fatty contents. Axial reaming is performed to 13.5  mm, proximal reaming to 18D  and the sleeve machined to a small. A 18D small trial sleeve is placed into the proximal femur.      The femur is then retracted anteriorly to gain acetabular exposure. Acetabular retractors are placed and the labrum and osteophytes are removed, Acetabular reaming is performed to 49  mm and a 50  mm Pinnacle acetabular shell is placed in anatomic position with excellent purchase. Additional dome screws were not needed. An apex hole eliminator is placed and the permanent 32 mm neutral plus 4 Marathon liner is placed into the acetabular shell.      The trial femur is then placed into the femoral canal. The size is 18 x 13  stem with a 36 + 8  neck and a 32 + 0 head with the neck version matching  the patients' native anteversion. The hip is reduced with excellent stability with full extension and full external rotation, 70 degrees flexion with 40 degrees adduction and 90 degrees internal rotation and 90 degrees of flexion with 70 degrees of internal rotation. The operative leg is placed on top of the non-operative leg and the leg lengths are found to be equal. The trials are then removed and the permanent implant of the same size is impacted into the femoral canal. The ceramic femoral head of the same size as the trial is placed and the hip is reduced with the  same stability parameters. The operative leg is again placed on top of the non-operative leg and the leg lengths are found to be equal.      The wound is then copiously irrigated with saline solution and the capsule and short external rotators are re-attached to the femur through drill holes with Ethibond suture. The fascia lata is closed over a hemovac drain with #1 vicryl suture and the fascia lata, gluteal muscles and subcutaneous tissues are injected  with Exparel 20ml diluted with saline 50ml. The subcutaneous tissues are closed with #1 and2-0 vicryl and the subcuticular layer closed with running 4-0 Monocryl. The drain is hooked to suction, incision cleaned and dried, and steri-srips and a bulky sterile dressing applied. The limb is placed into a knee immobilizer and the patient is awakened and transported to recovery in stable condition.      Please note that a surgical assistant was a medical necessity for this procedure in order to perform it in a safe and expeditious manner. The assistant was necessary to provide retraction to the vital neurovascular structures and to retract and position the limb to allow for anatomic placement of the prosthetic components.  Gus Rankin Chales Pelissier, MD    02/23/2011, 9:52 AM

## 2011-02-23 NOTE — Transfer of Care (Signed)
Immediate Anesthesia Transfer of Care Note  Patient: Angela Rosario  Procedure(s) Performed:  TOTAL HIP ARTHROPLASTY  Patient Location: PACU  Anesthesia Type: General  Level of Consciousness: awake, alert  and patient cooperative  Airway & Oxygen Therapy: Patient Spontanous Breathing and Patient connected to face mask oxygen  Post-op Assessment: Report given to PACU RN and Post -op Vital signs reviewed and stable  Post vital signs: Reviewed and stable  Complications: No apparent anesthesia complications

## 2011-02-23 NOTE — Interval H&P Note (Signed)
History and Physical Interval Note:  02/23/2011 8:20 AM  Milderd Meager  has presented today for surgery, with the diagnosis of osteoarthritis left hip  The various methods of treatment have been discussed with the patient and family. After consideration of risks, benefits and other options for treatment, the patient has consented to  Procedure(s): TOTAL HIP ARTHROPLASTY as a surgical intervention .  The patients' history has been reviewed, patient examined, no change in status, stable for surgery.  I have reviewed the patients' chart and labs.  Questions were answered to the patient's satisfaction.     Loanne Drilling

## 2011-02-23 NOTE — Plan of Care (Signed)
Problem: Consults Goal: Diagnosis- Total Joint Replacement Primary Total Hip     

## 2011-02-23 NOTE — Progress Notes (Signed)
Report given to Karen, R.N.  For lunch relief. 

## 2011-02-23 NOTE — Preoperative (Signed)
Beta Blockers   Reason not to administer Beta Blockers:Not Applicable pt took Coreg this am

## 2011-02-23 NOTE — Anesthesia Preprocedure Evaluation (Signed)
Anesthesia Evaluation  Patient identified by MRN, date of birth, ID band Patient awake    Reviewed: Allergy & Precautions, H&P , NPO status , Patient's Chart, lab work & pertinent test results, reviewed documented beta blocker date and time   Airway Mallampati: II TM Distance: >3 FB Neck ROM: Full    Dental  (+) Dental Advisory Given   Pulmonary neg pulmonary ROS,  clear to auscultation        Cardiovascular Regular Normal Pt not sure if she has HTN   Neuro/Psych Negative Neurological ROS  Negative Psych ROS   GI/Hepatic negative GI ROS, Neg liver ROS,   Endo/Other  Diabetes mellitus-, Type 2, Oral Hypoglycemic Agents  Renal/GU negative Renal ROS   SUI    Musculoskeletal negative musculoskeletal ROS (+)   Abdominal   Peds negative pediatric ROS (+)  Hematology negative hematology ROS (+)   Anesthesia Other Findings Upper front bridge  Reproductive/Obstetrics negative OB ROS                           Anesthesia Physical Anesthesia Plan  ASA: III  Anesthesia Plan: General   Post-op Pain Management:    Induction: Intravenous  Airway Management Planned: Oral ETT  Additional Equipment:   Intra-op Plan:   Post-operative Plan: Extubation in OR  Informed Consent: I have reviewed the patients History and Physical, chart, labs and discussed the procedure including the risks, benefits and alternatives for the proposed anesthesia with the patient or authorized representative who has indicated his/her understanding and acceptance.     Plan Discussed with: CRNA and Surgeon  Anesthesia Plan Comments:         Anesthesia Quick Evaluation

## 2011-02-23 NOTE — Anesthesia Postprocedure Evaluation (Signed)
  Anesthesia Post-op Note  Patient: Angela Rosario  Procedure(s) Performed:  TOTAL HIP ARTHROPLASTY  Patient Location: PACU  Anesthesia Type: General  Level of Consciousness: oriented and sedated  Airway and Oxygen Therapy: Patient Spontanous Breathing and Patient connected to nasal cannula oxygen  Post-op Pain: mild  Post-op Assessment: Post-op Vital signs reviewed, Patient's Cardiovascular Status Stable, Respiratory Function Stable and Patent Airway  Post-op Vital Signs: stable  Complications: No apparent anesthesia complications

## 2011-02-23 NOTE — Transfer of Care (Signed)
Immediate Anesthesia Transfer of Care Note  Patient: Angela Rosario  Procedure(s) Performed:  TOTAL HIP ARTHROPLASTY  Patient Location: PACU  Anesthesia Type: General  Level of Consciousness: awake, alert , oriented and patient cooperative  Airway & Oxygen Therapy: Patient Spontanous Breathing, Patient connected to face mask and aerosol face mask  Post-op Assessment: Report given to PACU RN and Post -op Vital signs reviewed and stable  Post vital signs: Reviewed  Complications: No apparent anesthesia complications

## 2011-02-23 NOTE — H&P (View-Only) (Signed)
Angela Rosario  DOB: 03/08/1940  Date Of Admission: 02/22/2010  Chief Complaint:  Left Hip Pain  History of Present Illness The patient is a 71 year old female who comes in today for a preoperative History and Physical. The patient is scheduled for a left total hip arthroplasty to be performed by Dr. Frank V. Aluisio, MD at [facility] on 02/23/2011. Ms. Angela Rosario is a 71-year-old female in today for evaluation of her left hip. She saw Dr. Norris about a year and a half ago and was told that she had hip arthritis at that time. She states that the hip has gotten progressively worse over time. The pain is just in the left hip. She does not have any right hip pain. The pain is in her groin radiating down to her thigh to her knee. She is not having any back pain with this. She is not having lower extremity weakness or paresthesia. She does not have any right hip pain at this time. The hip is definitely limiting what she can and cannot do. It is hurting her at night. It has gotten progressively worse over the past several months. She would like to procede with total hip replacement. They have been treated conservatively in the past for the above stated problem and despite conservative measures, they continue to have progressive pain and severe functional limitations and dysfunction. They have failed non-operative management. It is felt that they would benefit from undergoing total joint replacement. Risks and benefits of the procedure have been discussed with the patient and they elect to proceed with surgery. There are no active contraindications to surgery such as ongoing infection or rapidly progressive neurological disease.  Allergies No Known Drug Allergies.   Medications Alpha-Lipoic Acid 600 MG CAPS carvedilol (COREG) 6.25 MG table  Coenzyme Q10 (CO Q10) 100 MG TABS  Fesoterodine Fumarate (TOVIAZ) 8 MG TB24  glipiZIDE-metformin (METAGLIP) 2.5-500 MG per tablet  Glucosamine-Chondroit-Vit C-Mn (GLUCOSAMINE-CHONDROITIN) TABS  lovastatin (MEVACOR) 10 MG tablet  meloxicam (MOBIC) 15 MG tablet  Multiple Vitamin (MULTI-VITAMIN PO)  Omega-3 Fatty Acids (FISH OIL PO)  Past Medical Urinary Incontinence Diabetes Mellitus, Type II Diverticulitis Of Colon Hypercholesterolemia Skin Cancer  Past Surgical History Gallbladder Surgery. Date: 1992. laporoscopic Hysterectomy. Date: 1985. partial (non-cancerous)  Family History Diabetes Mellitus. mother Osteoarthritis. mother Osteoporosis. mother  Social History Alcohol use. never consumed alcohol Children. 2 Current work status. retired Drug/Alcohol Rehab (Currently). no Drug/Alcohol Rehab (Previously). no Exercise. Exercises daily; does running / walking Illicit drug use. no Living situation. live alone Marital status. divorced Number of flights of stairs before winded. 2-3 Pain Contract. no Tobacco / smoke exposure. no Tobacco use. never smoker Post-Surgical Plans. Wants to look into [facility]. Advance Directives. Living Will and Healthcare POA  Review of Systems General:Not Present- Chills, Fever, Night Sweats, Appetite Loss, Fatigue, Feeling sick, Weight Gain and Weight Loss. Skin:Not Present- Itching, Rash, Skin Color Changes, Ulcer, Psoriasis and Change in Hair or Nails. HEENT:Not Present- Sensitivity to light, Hearing problems, Nose Bleed and Ringing in the Ears. Neck:Not Present- Swollen Glands and Neck Mass. Respiratory:Not Present- Snoring, Chronic Cough, Bloody sputum and Dyspnea. Cardiovascular:Not Present- Shortness of Breath, Chest Pain, Swelling of Extremities, Leg Cramps and Palpitations. Gastrointestinal:Not Present- Bloody Stool, Heartburn, Abdominal Pain, Vomiting, Nausea and Incontinence of Stool. Female Genitourinary:Not Present- Blood in Urine, Menstrual Irregularities, Frequency, Incontinence and Nocturia. Musculoskeletal:Not Present-  Muscle Weakness, Muscle Pain, Joint Stiffness, Joint Swelling, Joint Pain and Back Pain. Neurological:Not Present- Tingling, Numbness, Burning, Tremor, Headaches   and Dizziness. Psychiatric:Not Present- Anxiety, Depression and Memory Loss. Endocrine:Not Present- Cold Intolerance, Heat Intolerance, Excessive hunger and Excessive Thirst. Hematology:Not Present- Abnormal Bleeding, Anemia, Blood Clots and Easy Bruising.   Vitals Weight: 200 lb Height: 66 in Body Surface Area: 2.06 m Body Mass Index: 32.28 kg/m Pulse: 72 (Regular) Resp.: 14 (Unlabored) BP: 142/82 (Sitting, Left Arm, Standard)  Physical Exam The physical exam findings are as follows:  General Mental Status - Alert, cooperative and good historian. General Appearance- pleasant. Not in acute distress. Orientation- Oriented X3. Build & Nutrition- Well nourished and Well developed.  Head and Neck Head- normocephalic, atraumatic . Neck Global Assessment- supple. no bruit auscultated on the right and no bruit auscultated on the left.  Eye Pupil- Bilateral- Regular and Round. Motion- Bilateral- EOMI.  Chest and Lung Exam Auscultation: Breath sounds:- clear at anterior chest wall and - clear at posterior chest wall. Adventitious sounds:- No Adventitious sounds.  Cardiovascular Auscultation:Rhythm- Regular rate and rhythm. Heart Sounds- S1 WNL and S2 WNL. Murmurs & Other Heart Sounds:Auscultation of the heart reveals - No Murmurs.  Abdomen Palpation/Percussion:Tenderness- Abdomen is non-tender to palpation. Rigidity (guarding)- Abdomen is soft. Auscultation:Auscultation of the abdomen reveals - Bowel sounds normal.   Female Genitourinary Not done, not pertinent to present illness  Musculoskeletal Evaluation of her right hip normal range of motion and no discomfort. Left hip flexion about 90. No internal rotation about 10 degrees external rotation, 20 degrees  abduction.  RADIOGRAPHS: AP pelvis and lateral of the left hip show that the right hip has no evidence of arthritis. Left hip has pretty significant joint space narrowing. She has large osteophyte formation. She is very close to completely bone on bone but not fully there.  Assessment & Plan Osteoarthritis Left Hip  Note: Patient is for a Left Total Hip Replacement by Dr. Aluisio.  Patient wants to look into [facility] after the hospital stay.  Drew Perkins, PA-C  

## 2011-02-23 NOTE — Progress Notes (Signed)
Portable ap pelvis and ap left hip x-rays done

## 2011-02-24 LAB — CBC
HCT: 30.1 % — ABNORMAL LOW (ref 36.0–46.0)
Hemoglobin: 10.5 g/dL — ABNORMAL LOW (ref 12.0–15.0)
MCHC: 34.9 g/dL (ref 30.0–36.0)
MCV: 89.9 fL (ref 78.0–100.0)
Platelets: 222 10*3/uL (ref 150–400)
WBC: 8.6 10*3/uL (ref 4.0–10.5)

## 2011-02-24 LAB — BASIC METABOLIC PANEL
CO2: 27 mEq/L (ref 19–32)
Calcium: 8.4 mg/dL (ref 8.4–10.5)
GFR calc non Af Amer: 85 mL/min — ABNORMAL LOW (ref >90)
Sodium: 138 mEq/L (ref 135–145)

## 2011-02-24 LAB — GLUCOSE, CAPILLARY
Glucose-Capillary: 204 mg/dL — ABNORMAL HIGH (ref 70–99)
Glucose-Capillary: 138 mg/dL — ABNORMAL HIGH (ref 70–99)

## 2011-02-24 NOTE — Plan of Care (Signed)
Problem: Phase II Progression Outcomes Goal: Discharge plan established Outcome: Completed/Met Date Met:  02/24/11 Looking into [facility].  Problem: Phase III Progression Outcomes Goal: Anticoagulant follow-up in place Outcome: Completed/Met Date Met:  02/24/11 Xarelto

## 2011-02-24 NOTE — Progress Notes (Signed)
Subjective: 1 Day Post-Op Procedure(s) (LRB): TOTAL HIP ARTHROPLASTY (Left) Patient reports pain as mild.   Patient seen in rounds by Dr. Lequita Halt. Patient is doing well on day 1. We will start therapy today. Plan is to go [facility] after hospital stay. We will get social worker involved.  Objective: Vital signs in last 24 hours: Temp:  [97.4 F (36.3 C)-98.9 F (37.2 C)] 98.2 F (36.8 C) (01/10 0443) Pulse Rate:  [65-86] 74  (01/10 0443) Resp:  [7-18] 16  (01/10 0443) BP: (97-139)/(57-75) 104/64 mmHg (01/10 0443) SpO2:  [96 %-100 %] 99 % (01/10 0443) Weight:  [91.354 kg (201 lb 6.4 oz)] 91.354 kg (201 lb 6.4 oz) (01/09 1220)  Intake/Output from previous day:  Intake/Output Summary (Last 24 hours) at 02/24/11 0821 Last data filed at 02/24/11 0600  Gross per 24 hour  Intake   3280 ml  Output   3230 ml  Net     50 ml    Output this shift: UOP 800  Labs:  [facility] 02/24/11 0350  HGB 10.5*    Basename 02/24/11 0350  WBC 8.6  RBC 3.35*  HCT 30.1*  PLT 222    Basename 02/24/11 0350  NA 138  K 3.7  CL 104  CO2 27  BUN 8  CREATININE 0.70  GLUCOSE 169*  CALCIUM 8.4   No results found for this basename: LABPT:2,INR:2 in the last 72 hours  Exam - Neurovascular intact Sensation intact distally Dressing - clean, dry Motor function intact - moving foot and toes well on exam.  Hemovac pulled without difficulty.  Past Medical History  Diagnosis Date  . Syncope   . Diastolic dysfunction   . Hyperlipidemia   . Episode of dizziness     Mild episodes  . Orthostatic hypotension     Some episodes  . Diabetes mellitus     niddm  . Arthritis     oa  . Stress incontinence, female     wears pads  . AC (acromioclavicular) joint bone spurs     bone spurs in neck  . 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  DVT Prophylaxis - Xarelto Protocol Partial-Weight Bearing 25-50% left Leg D/C Knee Immobilizer Hemovac Pulled Begin Therapy Hip Preacutions Keep foley until tomorrow. No vaccines.  Kort Stettler 02/24/2011, 8:21 AM

## 2011-02-24 NOTE — Progress Notes (Signed)
Physical Therapy Treatment Patient Details Name: Angela Rosario MRN: [id number] DOB: January 14, 1941 Today's Date: 02/24/2011 1445 - 1515; 2GT PT Assessment/Plan  PT - Assessment/Plan Comments on Treatment Session: Pt BP - sup 136/77; sit 148/68; stand 96/62; stand x 2 min 104/70; after amb 135/75.  No c/o of dizziness throughout PT Plan: Discharge plan remains appropriate PT Frequency: 7X/week Recommendations for Other Services: OT consult Follow Up Recommendations: Skilled nursing facility Equipment Recommended: Defer to next venue PT Goals  Acute Rehab PT Goals PT Goal Formulation: With patient Time For Goal Achievement: 7 days Pt will go Supine/Side to Sit: with supervision PT Goal: Supine/Side to Sit - Progress: Progressing toward goal Pt will go Sit to Supine/Side: with supervision PT Goal: Sit to Supine/Side - Progress: Progressing toward goal Pt will go Sit to Stand: with supervision PT Goal: Sit to Stand - Progress: Progressing toward goal Pt will go Stand to Sit: with supervision PT Goal: Stand to Sit - Progress: Progressing toward goal Pt will Ambulate: 51 - 150 feet;with supervision;with rolling walker PT Goal: Ambulate - Progress: Progressing toward goal  PT Treatment Precautions/Restrictions  Precautions Precautions: Posterior Hip Precaution Comments: sign hung in room Restrictions Weight Bearing Restrictions: Yes LLE Weight Bearing: Partial weight bearing LLE Partial Weight Bearing Percentage or Pounds: 25-50 Mobility (including Balance) Bed Mobility Supine to Sit: 1: +2 Total assist Supine to Sit Details (indicate cue type and reason): cues for sequence, technique and THP (pt 50%) Sit to Supine: 1: +2 Total assist Sit to Supine - Details (indicate cue type and reason): cues for sequence, technique and THP (pt 40%) Transfers Sit to Stand: 1: +2 Total assist;From bed;With upper extremity assist Sit to Stand Details (indicate cue type and reason): cues for use of  UEs and LE position -  Stand to Sit: 1: +2 Total assist;With armrests;To chair/3-in-1;With upper extremity assist Stand to Sit Details: cues for use of UEs and LE position -  Ambulation/Gait Ambulation/Gait Assistance: 1: +2 Total assist Ambulation/Gait Assistance Details (indicate cue type and reason): cues for posture, sequence, and position from RW Ambulation Distance (Feet): 63 Feet Assistive device: Rolling walker Gait Pattern: Step-to pattern    Exercise    End of Session PT - End of Session Equipment Utilized During Treatment: Gait belt Activity Tolerance: Patient tolerated treatment well Patient left: in bed;with call bell in reach;with family/visitor present Nurse Communication: Mobility status for transfers;Mobility status for ambulation General Behavior During Session: [facility] for tasks performed Cognition: [facility] for tasks performed  Mckay Tegtmeyer 02/24/2011, 3:28 PM

## 2011-02-24 NOTE — Progress Notes (Signed)
Utilization review completed.  

## 2011-02-24 NOTE — Progress Notes (Signed)
FL2 in shadow chart for MD signature. Pt plans to have her rehab at [facility]. CSW will assist with D/C planning to SNF.

## 2011-02-24 NOTE — Progress Notes (Signed)
  CARE MANAGEMENT NOTE 02/24/2011  Patient:  Angela Rosario, Angela Rosario   Account Number:  [id number]  Date Initiated:  02/24/2011  Documentation initiated by:  Colleen Can  Subjective/Objective Assessment:   dx osteoarthritis left hip; total hip replacemnt on day of admission     Action/Plan:   Plans for ST-SNF at [facility]   Anticipated DC Date:  02/26/2011   Anticipated DC Plan:  SKILLED NURSING FACILITY  In-house referral  Clinical Social Worker      DC Planning Services  CM consult      [facility] Choice  NA   Choice offered to / List presented to:  NA   DME arranged  NA        HH arranged  NA      Status of service:  Completed, signed off Medicare Important Message given?  NA - LOS <3 / Initial given by admissions Comments:  02/24/2011 Raynelle Bring BSN CCM [phone] CM spoke with patient . Plans are for ST SNF rehab. Has been referred to CSW. CM signing off

## 2011-02-24 NOTE — Progress Notes (Signed)
Physical Therapy Evaluation Patient Details Name: Angela Rosario MRN: [id number] DOB: 1940-05-11 Today's Date: 02/24/2011 1045 - 1118; EVAL Problem List:  Patient Active Problem List  Diagnoses  . Chronic diastolic heart failure  . Osteoarthritis of hip    Past Medical History:  Past Medical History  Diagnosis Date  . Syncope   . Diastolic dysfunction   . Hyperlipidemia   . Episode of dizziness     Mild episodes  . Orthostatic hypotension     Some episodes  . Diabetes mellitus     niddm  . Arthritis     oa  . Stress incontinence, female     wears pads  . AC (acromioclavicular) joint bone spurs     bone spurs in neck  . 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 Other surgical history     Hysterectomy  . Cholecystectomy 1992  . Abdominal hysterectomy 1985    1 ovary removed    PT Assessment/Plan/Recommendation PT Assessment Clinical Impression Statement: Pt with L THR (post) presents with decreased L LE strength/ROM and decreased functional mobility.  Pt will benefit from skilled PT intervention to maximize IND for d/c to next venue of care and eventual return home. PT Recommendation/Assessment: Patient will need skilled PT in the acute care venue PT Problem List: Decreased strength;Decreased range of motion;Decreased activity tolerance;Decreased mobility;Decreased knowledge of use of DME;Decreased knowledge of precautions;Pain PT Therapy Diagnosis : Difficulty walking PT Plan PT Frequency: 7X/week PT Treatment/Interventions: DME instruction;Gait training;Functional mobility training;Therapeutic exercise;Patient/family education;Therapeutic activities PT Recommendation Recommendations for Other Services: OT consult Follow Up Recommendations: Skilled nursing facility Equipment Recommended: Defer to next venue PT Goals  Acute Rehab PT Goals PT Goal Formulation: With patient Time  For Goal Achievement: 7 days Pt will go Supine/Side to Sit: with supervision PT Goal: Supine/Side to Sit - Progress: Progressing toward goal Pt will go Sit to Supine/Side: with supervision PT Goal: Sit to Supine/Side - Progress: Not met Pt will go Sit to Stand: with supervision PT Goal: Sit to Stand - Progress: Progressing toward goal Pt will go Stand to Sit: with supervision PT Goal: Stand to Sit - Progress: Progressing toward goal Pt will Ambulate: 51 - 150 feet;with supervision;with rolling walker PT Goal: Ambulate - Progress: Progressing toward goal  PT Evaluation Precautions/Restrictions  Precautions Precautions: Posterior Hip (ltd peripheral vision) Precaution Comments: sign hung in room Restrictions Weight Bearing Restrictions: Yes LLE Weight Bearing: Partial weight bearing LLE Partial Weight Bearing Percentage or Pounds: 25-50% Prior Functioning  Home Living Lives With: Alone Prior Function Level of Independence: Independent with basic ADLs;Independent with gait;Independent with transfers;Requires assistive device for independence Able to Take Stairs?: Yes Cognition Cognition Arousal/Alertness: Awake/alert Overall Cognitive Status: Appears within functional limits for tasks assessed Orientation Level: Oriented X4 Sensation/Coordination Coordination Gross Motor Movements are Fluid and Coordinated: Yes Extremity Assessment RUE Assessment RUE Assessment: Within Functional Limits LUE Assessment LUE Assessment: Within Functional Limits RLE Assessment RLE Assessment: Within Functional Limits LLE Assessment LLE Assessment: Exceptions to [facility] (70 hip flex, 20 hip abd - 2+/5 hip strength) Mobility (including Balance) Bed Mobility Bed Mobility: Yes Supine to Sit: 1: +2 Total assist (pt 60%) Supine to Sit Details (indicate cue type and reason): cues for sequence, technique and THP Transfers Transfers: Yes Sit to Stand: 1: +2 Total assist;From bed;With upper extremity  assist (pt 60%) Sit to Stand Details (indicate cue type and reason): cues  for use of UEs and LE position -  Stand to Sit: 1: +2 Total assist;With armrests;To chair/3-in-1;With upper extremity assist Stand to Sit Details: cues for use of UEs and LE position -  Ambulation/Gait Ambulation/Gait: Yes Ambulation/Gait Assistance: 1: +2 Total assist (pt 70%) Ambulation/Gait Assistance Details (indicate cue type and reason): cues for sequence, posture, position from RW and ER on L (ltd by c/o dizziness) Ambulation Distance (Feet): 5 Feet Assistive device: Rolling walker Gait Pattern: Step-to pattern    Exercise  Total Joint Exercises Ankle Circles/Pumps: AROM;15 reps;Supine;Both Heel Slides: AAROM;15 reps;Supine;Left Hip ABduction/ADduction: AAROM;15 reps;Left;Supine End of Session PT - End of Session Equipment Utilized During Treatment: Gait belt Activity Tolerance: Other (comment);Treatment limited secondary to medical complications (Comment) (pt c/o dizziness) Patient left: in chair;with call bell in reach Nurse Communication: Mobility status for transfers;Mobility status for ambulation General Behavior During Session: [facility] for tasks performed Cognition: [facility] for tasks performed  Theron Cumbie 02/24/2011, 12:49 PM

## 2011-02-25 ENCOUNTER — Encounter (HOSPITAL_COMMUNITY): Payer: Self-pay | Admitting: Orthopedic Surgery

## 2011-02-25 DIAGNOSIS — E871 Hypo-osmolality and hyponatremia: Secondary | ICD-10-CM | POA: Diagnosis not present

## 2011-02-25 LAB — GLUCOSE, CAPILLARY
Glucose-Capillary: 205 mg/dL — ABNORMAL HIGH (ref 70–99)
Glucose-Capillary: 180 mg/dL — ABNORMAL HIGH (ref 70–99)
Glucose-Capillary: 151 mg/dL — ABNORMAL HIGH (ref 70–99)

## 2011-02-25 LAB — CBC
HCT: 29.1 % — ABNORMAL LOW (ref 36.0–46.0)
MCH: 31.1 pg (ref 26.0–34.0)
MCHC: 34.7 g/dL (ref 30.0–36.0)
MCV: 89.5 fL (ref 78.0–100.0)
Platelets: 210 10*3/uL (ref 150–400)
RDW: 13.5 % (ref 11.5–15.5)

## 2011-02-25 LAB — BASIC METABOLIC PANEL
BUN: 8 mg/dL (ref 6–23)
Calcium: 8.4 mg/dL (ref 8.4–10.5)
Creatinine, Ser: 0.68 mg/dL (ref 0.50–1.10)
GFR calc Af Amer: >90 mL/min (ref >90)

## 2011-02-25 MED ORDER — TRAMADOL HCL 50 MG PO TABS
50.0000 mg | ORAL_TABLET | Freq: Four times a day (QID) | ORAL | Status: DC | PRN
  Administered 2011-02-26: 50 mg via ORAL
  Administered 2011-02-26: 100 mg via ORAL
  Filled 2011-02-25 (×2): qty 2

## 2011-02-25 MED ORDER — TRAMADOL HCL 50 MG PO TABS: 50.0000 mg | ORAL_TABLET | Freq: Four times a day (QID) | ORAL | Status: AC | PRN

## 2011-02-25 MED ORDER — RIVAROXABAN 10 MG PO TABS: 10.0000 mg | ORAL_TABLET | Freq: Every day | ORAL | Status: DC

## 2011-02-25 MED ORDER — ONDANSETRON HCL 4 MG PO TABS: 4.0000 mg | ORAL_TABLET | Freq: Four times a day (QID) | ORAL | Status: AC | PRN

## 2011-02-25 MED ORDER — METHOCARBAMOL 500 MG PO TABS: 500.0000 mg | ORAL_TABLET | Freq: Four times a day (QID) | ORAL | Status: AC | PRN

## 2011-02-25 MED ORDER — INSULIN ASPART 100 UNIT/ML ~~LOC~~ SOLN
8.0000 [IU] | Freq: Once | SUBCUTANEOUS | Status: AC
  Administered 2011-02-25: 8 [IU] via SUBCUTANEOUS

## 2011-02-25 MED ORDER — ACETAMINOPHEN 500 MG PO TABS: 500.0000 mg | ORAL_TABLET | Freq: Four times a day (QID) | ORAL | Status: DC | PRN

## 2011-02-25 MED ORDER — BISACODYL 10 MG RE SUPP: 10.0000 mg | Freq: Every day | RECTAL | Status: AC | PRN

## 2011-02-25 MED ORDER — POLYETHYLENE GLYCOL 3350 17 G PO PACK: 17.0000 g | PACK | Freq: Every day | ORAL | Status: AC | PRN

## 2011-02-25 MED ORDER — METOCLOPRAMIDE HCL 5 MG PO TABS: 5.0000 mg | ORAL_TABLET | Freq: Three times a day (TID) | ORAL | Status: AC | PRN

## 2011-02-25 MED ORDER — DSS 100 MG PO CAPS: 100.0000 mg | ORAL_CAPSULE | Freq: Two times a day (BID) | ORAL | Status: AC

## 2011-02-25 NOTE — Progress Notes (Signed)
Subjective: 2 Days Post-Op Procedure(s) (LRB): TOTAL HIP ARTHROPLASTY (Left) Patient reports pain as mild.   Patient has complaints of some sedation with the Oxy IR.  Switch to milder pain pill.  Plan is to go to [facility].  Will get summary prepared for transfer tomorrow.  Objective: Vital signs in last 24 hours: Temp:  [98.2 F (36.8 C)-100.1 F (37.8 C)] 98.2 F (36.8 C) (01/11 0641) Pulse Rate:  [81-100] 81  (01/11 0641) Resp:  [16] 16  (01/11 0641) BP: (109-141)/(67-77) 141/77 mmHg (01/11 0641) SpO2:  [93 %-96 %] 94 % (01/11 0641)  Intake/Output from previous day:  Intake/Output Summary (Last 24 hours) at 02/25/11 1420 Last data filed at 02/25/11 1239  Gross per 24 hour  Intake 1081.58 ml  Output   2000 ml  Net -918.42 ml    Intake/Output this shift: Total I/O In: 480 [P.O.:480] Out: -   Labs:  Basename 02/25/11 0356 02/24/11 0350  HGB 10.1* 10.5*    Basename 02/25/11 0356 02/24/11 0350  WBC 9.7 8.6  RBC 3.25* 3.35*  HCT 29.1* 30.1*  PLT 210 222    Basename 02/25/11 0356 02/24/11 0350  NA 134* 138  K 3.6 3.7  CL 99 104  CO2 28 27  BUN 8 8  CREATININE 0.68 0.70  GLUCOSE 201* 169*  CALCIUM 8.4 8.4   No results found for this basename: LABPT:2,INR:2 in the last 72 hours  Exam - Neurovascular intact Sensation intact distally Dressing/Incision - clean, dry, no drainage Motor function intact - moving foot and toes well on exam.   Past Medical History  Diagnosis Date  . Syncope   . Diastolic dysfunction   . Hyperlipidemia   . Episode of dizziness     Mild episodes  . Orthostatic hypotension     Some episodes  . Diabetes mellitus     niddm  . Arthritis     oa  . Stress incontinence, female     wears pads  . AC (acromioclavicular) joint bone spurs     bone spurs in neck  . 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

## 2011-02-25 NOTE — Progress Notes (Signed)
Physical Therapy Treatment Patient Details Name: Marquasia Schmieder MRN: [id number] DOB: [birth year] Today's Date: 02/25/2011 1350 - 1404; GT PT Assessment/Plan  PT - Assessment/Plan Comments on Treatment Session: no c/o dizziness PT Plan: Discharge plan remains appropriate PT Frequency: 7X/week Follow Up Recommendations: Skilled nursing facility Equipment Recommended: Defer to next venue PT Goals  Acute Rehab PT Goals PT Goal Formulation: With patient Time For Goal Achievement: 7 days Pt will go Supine/Side to Sit: with supervision Pt will go Sit to Supine/Side: with supervision PT Goal: Sit to Supine/Side - Progress: Progressing toward goal Pt will go Sit to Stand: with supervision PT Goal: Sit to Stand - Progress: Progressing toward goal Pt will go Stand to Sit: with supervision PT Goal: Stand to Sit - Progress: Progressing toward goal Pt will Ambulate: 51 - 150 feet;with supervision;with rolling walker PT Goal: Ambulate - Progress: Progressing toward goal  PT Treatment Precautions/Restrictions  Precautions Precautions: Posterior Hip Precaution Comments: sign hung in room Required Braces or Orthoses: No Restrictions Weight Bearing Restrictions: Yes LLE Weight Bearing: Partial weight bearing LLE Partial Weight Bearing Percentage or Pounds: 25-50% Mobility (including Balance) Bed Mobility Bed Mobility: Yes Sit to Supine: 1: +2 Total assist Sit to Supine - Details (indicate cue type and reason): cues for sequence/technique and THP Transfers Sit to Stand: 4: Min assist Sit to Stand Details (indicate cue type and reason): cues for UE use Stand to Sit: 4: Min assist;3: Mod assist;To bed;With upper extremity assist Stand to Sit Details: cues for LE position and use of UEs Ambulation/Gait Ambulation/Gait Assistance: 4: Min assist Ambulation/Gait Assistance Details (indicate cue type and reason): cues for position from RW, posture, and PWB Ambulation Distance (Feet): 86  Feet Assistive device: Rolling walker Gait Pattern: Step-to pattern    Exercise    End of Session PT - End of Session Activity Tolerance: Patient tolerated treatment well Patient left: in bed;with call bell in reach;with family/visitor present Nurse Communication: Mobility status for transfers;Mobility status for ambulation General Behavior During Session: [facility] for tasks performed Cognition: [facility] for tasks performed  Lakeesha Fontanilla 02/25/2011, 3:27 PM

## 2011-02-25 NOTE — Discharge Summary (Signed)
Physician Discharge Summary   Patient ID: Angela Rosario MRN: [id number] DOB/AGE: [birth year] 72 y.o.  Admit date: 02/23/2011 Discharge date: 02/26/2011  Primary Diagnosis: Osteoarthritis Left Hip  Admission Diagnoses: Past Medical History  Diagnosis Date  . Syncope   . Diastolic dysfunction   . Hyperlipidemia   . Episode of dizziness     Mild episodes  . Orthostatic hypotension     Some episodes  . Diabetes mellitus     niddm  . Arthritis     oa  . Stress incontinence, female     wears pads  . AC (acromioclavicular) joint bone spurs     bone spurs in neck  . Retinitis pigmentosa     poor peripheral vision both eyes    Discharge Diagnoses:  Principal Problem:  *Osteoarthritis of hip Active Problems:  Postop Hyponatremia   Procedure: Procedure(s) (LRB): TOTAL HIP ARTHROPLASTY (Left)   Consults: none  HPI:  Angela Rosario is a 71 y.o. female with end stage arthritis of her left hip with progressively worsening pain and dysfunction. Pain occurs with activity and rest including pain at night. She has tried analgesics, protected weight bearing and rest without benefit. Pain is too severe to attempt physical therapy. Radiographs demonstrate bone on bone arthritis with subchondral cyst formation. She presents now for left THA.  Laboratory Data: Hospital Outpatient Visit on 02/17/2011  Component Date Value Range Status  . MRSA, PCR  02/17/2011 NEGATIVE  NEGATIVE Final  . Staphylococcus aureus  02/17/2011 NEGATIVE  NEGATIVE Final   Comment:                                 The Xpert SA Assay (FDA                          approved for NASAL specimens                          only), is one component of                          a comprehensive surveillance                          program.  It is not intended                          to diagnose infection nor to                          guide or monitor treatment.  . WBC (K/uL) 02/17/2011 7.8  4.0-10.5 Final  . RBC  (MIL/uL) 02/17/2011 4.70  3.87-5.11 Final  . Hemoglobin (g/dL) 78/46/9629 52.8  41.3-24.4 Final  . HCT (%) 02/17/2011 42.8  36.0-46.0 Final  . MCV (fL) 02/17/2011 91.1  78.0-100.0 Final  . MCH (pg) 02/17/2011 30.9  26.0-34.0 Final  . MCHC (g/dL) 02/16/7251 66.4  40.3-47.4 Final  . RDW (%) 02/17/2011 13.4  11.5-15.5 Final  . Platelets (K/uL) 02/17/2011 288  150-400 Final  . Sodium (mEq/L) 02/17/2011 138  135-145 Final  . Potassium (mEq/L) 02/17/2011 4.0  3.5-5.1 Final  . Chloride (mEq/L) 02/17/2011 101  96-112 Final  . CO2 (mEq/L) 02/17/2011 28  19-32 Final  . Glucose, Bld (mg/dL)  02/17/2011 115* 70-99 Final  . BUN (mg/dL) 16/11/9602 20  5-40 Final  . Creatinine, Ser (mg/dL) 98/12/9145 8.29  5.62-1.30 Final  . Calcium (mg/dL) 86/57/8469 62.9  5.2-84.1 Final  . Total Protein (g/dL) 32/44/0102 7.3  7.2-5.3 Final  . Albumin (g/dL) 66/44/0347 4.3  4.2-5.9 Final  . AST (U/L) 02/17/2011 26  0-37 Final  . ALT (U/L) 02/17/2011 36* 0-35 Final  . Alkaline Phosphatase (U/L) 02/17/2011 77  39-117 Final  . Total Bilirubin (mg/dL) 56/38/7564 0.5  3.3-2.9 Final  . GFR calc non Af Amer (mL/min) 02/17/2011 82* >90 Final  . GFR calc Af Amer (mL/min) 02/17/2011 >90  >90 Final   Comment:                                 The eGFR has been calculated                          using the CKD EPI equation.                          This calculation has not been                          validated in all clinical                          situations.                          eGFR's persistently                          <90 mL/min signify                          possible Chronic Kidney Disease.  Marland Kitchen Prothrombin Time (seconds) 02/17/2011 12.8  11.6-15.2 Final  . INR  02/17/2011 0.94  0.00-1.49 Final  . aPTT (seconds) 02/17/2011 30  24-37 Final  . Color, Urine  02/17/2011 YELLOW  YELLOW Final  . APPearance  02/17/2011 CLEAR  CLEAR Final  . Specific Gravity, Urine  02/17/2011 1.025  1.005-1.030 Final  . pH   02/17/2011 6.5  5.0-8.0 Final  . Glucose, UA (mg/dL) 51/88/4166 NEGATIVE  NEGATIVE Final  . Hgb urine dipstick  02/17/2011 NEGATIVE  NEGATIVE Final  . Bilirubin Urine  02/17/2011 NEGATIVE  NEGATIVE Final  . Ketones, ur (mg/dL) 08/14/1599 NEGATIVE  NEGATIVE Final  . Protein, ur (mg/dL) 09/32/3557 NEGATIVE  NEGATIVE Final  . Urobilinogen, UA (mg/dL) 32/20/2542 1.0  7.0-6.2 Final  . Nitrite  02/17/2011 NEGATIVE  NEGATIVE Final  . Leukocytes, UA  02/17/2011 SMALL* NEGATIVE Final  . Neutrophils Relative (%) 02/17/2011 57  43-77 Final  . Neutro Abs (K/uL) 02/17/2011 4.5  1.7-7.7 Final  . Lymphocytes Relative (%) 02/17/2011 31  12-46 Final  . Lymphs Abs (K/uL) 02/17/2011 2.5  0.7-4.0 Final  . Monocytes Relative (%) 02/17/2011 8  3-12 Final  . Monocytes Absolute (K/uL) 02/17/2011 0.7  0.1-1.0 Final  . Eosinophils Relative (%) 02/17/2011 3  0-5 Final  . Eosinophils Absolute (K/uL) 02/17/2011 0.3  0.0-0.7 Final  . Basophils Relative (%) 02/17/2011 0  0-1 Final  . Basophils Absolute (K/uL) 02/17/2011 0.0  0.0-0.1 Final  .  Squamous Epithelial / LPF  02/17/2011 FEW* RARE Final  . WBC, UA (WBC/hpf) 02/17/2011 7-10  <3 Final  . Bacteria, UA  02/17/2011 FEW* RARE Final  . Urine-Other  02/17/2011 MUCOUS PRESENT   Final    Basename 02/25/11 0356 02/24/11 0350  HGB 10.1* 10.5*    Basename 02/25/11 0356 02/24/11 0350  WBC 9.7 8.6  RBC 3.25* 3.35*  HCT 29.1* 30.1*  PLT 210 222    Basename 02/25/11 0356 02/24/11 0350  NA 134* 138  K 3.6 3.7  CL 99 104  CO2 28 27  BUN 8 8  CREATININE 0.68 0.70  GLUCOSE 201* 169*  CALCIUM 8.4 8.4   No results found for this basename: LABPT:2,INR:2 in the last 72 hours  X-Rays:Dg Chest 2 View  02/17/2011  *RADIOLOGY REPORT*  Clinical Data: Preoperative respiratory films.  CHEST - 2 VIEW  Comparison: None.  Findings: The lungs are clear.  Heart size is normal.  No pneumothorax or pleural effusion.  No focal bony abnormality.  IMPRESSION: No acute disease.   Original Report Authenticated By: Bernadene Bell. D'ALESSIO, M.D.   Dg Hip Complete Left  02/17/2011  *RADIOLOGY REPORT*  Clinical Data: Patient for left hip replacement.  Preoperative examination.  LEFT HIP - COMPLETE 2+ VIEW  Comparison: CT abdomen and pelvis 01/28/2009.  Findings: The patient has left much worse than right hip degenerative disease.  There is no fracture or dislocation.  Soft tissue structures are unremarkable.  IMPRESSION: Left worse than right hip osteoarthritis.  Original Report Authenticated By: Bernadene Bell. Maricela Curet, M.D.   Dg Pelvis Portable  02/23/2011  *RADIOLOGY REPORT*  Clinical Data: Postop.  PORTABLE PELVIS  Comparison: 02/17/2011.  Findings: Interval left total hip arthroplasty with surgical drain in place.  Subcutaneous air is noted.  Joint space narrowing and subchondral sclerosis in the right hip.  Obturator rings are intact.  IMPRESSION:  1.  Interval left hip arthroplasty with expected postoperative findings. 2.  Right hip osteoarthritis.  Original Report Authenticated By: Reyes Ivan, M.D.   Dg Hip Portable 1 View Left  02/23/2011  *RADIOLOGY REPORT*  Clinical Data: Postop.  PORTABLE LEFT HIP - 1 VIEW  Comparison: 02/17/2011.  Findings: The patient is status post left total hip arthroplasty. Surgical drain is in place.  Subcutaneous air is noted.  No hardware complications.  IMPRESSION: Interval left total hip arthroplasty with expected postoperative findings.  Original Report Authenticated By: Reyes Ivan, M.D.    EKG: Orders placed in visit on 12/31/10  . EKG 12-LEAD     Hospital Course: Patient was admitted to [facility] and taken to the OR and underwent the above state procedure without complications.  Patient tolerated the procedure well and was later transferred to the recovery room and then to the orthopaedic floor for postoperative care.  They were given PO and IV analgesics for pain control following their surgery.  They were given 24 hours  of postoperative antibiotics and started on DVT prophylaxis.   PT and OT were ordered for total joint protocol.  Discharge planning consulted to help with postop disposition and equipment needs.  Patient had a decent night on the evening of surgery and started to get up with therapy on day one after being seen by Dr. Lequita Halt.  Hemovac drain was pulled without difficulty. She wanted to look into [facility] so we got the Child psychotherapist involved. Continued to progress with therapy into day two.  Dressing was changed on day two and the  incision was healing well.  She was seen on the afternoon of Day 2 and the plan was to go to [facility].  After speaking to the Case Manager, the bed was to be available tomorrow so the summary was prepared.  Plan for continued therapy and if does well, the transfer tomorrow.  Discharge Medications: Prior to Admission medications   Medication Sig Start Date End Date Taking? Authorizing Provider  carvedilol (COREG) 6.25 MG tablet Take 6.25 mg by mouth 2 (two) times daily with a meal.    Yes Historical Provider, MD  glipiZIDE-metformin (METAGLIP) 2.5-500 MG per tablet Take 1 tablet by mouth every morning.    Yes Historical Provider, MD  lovastatin (MEVACOR) 10 MG tablet Take 10 mg by mouth at bedtime.    Yes Historical Provider, MD  acetaminophen (TYLENOL) 500 MG tablet Take 1-2 tablets (500-1,000 mg total) by mouth every 6 (six) hours as needed. 02/25/11   Jmarion Christiano Julien Girt, PA  bisacodyl (DULCOLAX) 10 MG suppository Place 1 suppository (10 mg total) rectally daily as needed. 02/25/11 03/07/11  Prerana Strayer, PA  docusate sodium 100 MG CAPS Take 100 mg by mouth 2 (two) times daily. 02/25/11 03/07/11  Deerica Waszak, PA  EPIPEN 2-PAK 0.3 MG/0.3ML DEVI Inject 1 Applicatorful into the muscle as needed. For bee stings 11/17/10   Historical Provider, MD  methocarbamol (ROBAXIN) 500 MG tablet Take 1 tablet (500 mg total) by mouth every 6 (six) hours as needed. 02/25/11 03/07/11   Alize Borrayo, PA  metoCLOPramide (REGLAN) 5 MG tablet Take 1-2 tablets (5-10 mg total) by mouth every 8 (eight) hours as needed (if ondansetron (ZOFRAN) ineffective.). 02/25/11 03/07/11  Dietrick Barris, PA  ondansetron (ZOFRAN) 4 MG tablet Take 1 tablet (4 mg total) by mouth every 6 (six) hours as needed for nausea. 02/25/11 03/04/11  Marquavious Nazar, PA  polyethylene glycol (MIRALAX / GLYCOLAX) packet Take 17 g by mouth daily as needed. 02/25/11 02/28/11  Yonas Bunda Julien Girt, PA  rivaroxaban (XARELTO) 10 MG TABS tablet Take 1 tablet (10 mg total) by mouth daily with breakfast. 02/25/11   Jasmin Winberry Julien Girt, PA  traMADol (ULTRAM) 50 MG tablet Take 1-2 tablets (50-100 mg total) by mouth every 6 (six) hours as needed. 02/25/11 03/07/11  Samiha Denapoli Julien Girt, PA    Diet: heart healthy  Activity:PWB No bending hip over 90 degrees- A "L" Angle Do not cross legs Do not let foot roll inward  When turning these patients a pillow should be placed between the patient's legs to prevent crossing.  Patients should have the affected knee fully extended when trying to sit or stand from all surfaces to prevent excessive hip flexion.  When ambulating and turning toward the affected side the affected leg should have the toes turned out prior to moving the walker and the rest of patient's body as to prevent internal rotation/ turning in of the leg.  Abduction pillows are the most effective way to prevent a patient from not crossing legs or turning toes in at rest. If an abduction pillow is not ordered placing a regular pillow length wise between the patient's legs is also an effective reminder.  It is imperative that these precautions be maintained so that the surgical hip does not dislocate.    Follow-up:in 2 weeks  Disposition: [facility]  Discharged Condition: good at time of summary.   Discharge Orders    Future Orders Please Complete By Expires   Diet - low sodium heart healthy       Call MD /  Call 911      Comments:   If you experience chest pain or shortness of breath, CALL 911 and be transported to the hospital emergency room.  If you develope a fever above 101 F, pus (white drainage) or increased drainage or redness at the wound, or calf pain, call your surgeon's office.   Constipation Prevention      Comments:   Drink plenty of fluids.  Prune juice may be helpful.  You may use a stool softener, such as Colace (over the counter) 100 mg twice a day.  Use MiraLax (over the counter) for constipation as needed.   Increase activity slowly as tolerated      Weight Bearing as taught in Physical Therapy      Comments:   Use a walker or crutches as instructed.   Discharge instructions      Comments:   Pick up stool softner and laxative for home. Do not submerge incision under water. May shower. Continue to use ice for pain and swelling from surgery. Hip precautions.  Total Hip Protocol.   Driving restrictions      Comments:   No driving   Lifting restrictions      Comments:   No lifting   Follow the hip precautions as taught in Physical Therapy      Change dressing      Comments:   You may change your dressing daily with sterile 4 x 4 inch gauze dressing and paper tape.   TED hose      Comments:   Use stockings (TED hose) for 3 weeks on both leg(s).  You may remove them at night for sleeping.     Current Discharge Medication List    START taking these medications   Details  bisacodyl (DULCOLAX) 10 MG suppository Place 1 suppository (10 mg total) rectally daily as needed. Qty: 12 suppository, Refills: 0    docusate sodium 100 MG CAPS Take 100 mg by mouth 2 (two) times daily. Qty: 30 capsule, Refills: 0    methocarbamol (ROBAXIN) 500 MG tablet Take 1 tablet (500 mg total) by mouth every 6 (six) hours as needed. Qty: 80 tablet, Refills: 0    metoCLOPramide (REGLAN) 5 MG tablet Take 1-2 tablets (5-10 mg total) by mouth every 8 (eight) hours as needed (if  ondansetron (ZOFRAN) ineffective.). Qty: 30 tablet, Refills: 0    ondansetron (ZOFRAN) 4 MG tablet Take 1 tablet (4 mg total) by mouth every 6 (six) hours as needed for nausea. Qty: 20 tablet, Refills: 0    polyethylene glycol (MIRALAX / GLYCOLAX) packet Take 17 g by mouth daily as needed. Qty: 14 each, Refills: 0    rivaroxaban (XARELTO) 10 MG TABS tablet Take 1 tablet (10 mg total) by mouth daily with breakfast. Qty: 18 tablet, Refills: 0    traMADol (ULTRAM) 50 MG tablet Take 1-2 tablets (50-100 mg total) by mouth every 6 (six) hours as needed. Qty: 80 tablet, Refills: 0      CONTINUE these medications which have CHANGED   Details  acetaminophen (TYLENOL) 500 MG tablet Take 1-2 tablets (500-1,000 mg total) by mouth every 6 (six) hours as needed. Qty: 80 tablet, Refills: 0      CONTINUE these medications which have NOT CHANGED   Details  carvedilol (COREG) 6.25 MG tablet Take 6.25 mg by mouth 2 (two) times daily with a meal.     glipiZIDE-metformin (METAGLIP) 2.5-500 MG per tablet Take 1 tablet by mouth every morning.  lovastatin (MEVACOR) 10 MG tablet Take 10 mg by mouth at bedtime.     EPIPEN 2-PAK 0.3 MG/0.3ML DEVI Inject 1 Applicatorful into the muscle as needed. For bee stings      STOP taking these medications     Alpha-Lipoic Acid 600 MG CAPS      Coenzyme Q10 (CO Q10) 100 MG TABS      Glucosamine-Chondroit-Vit C-Mn (GLUCOSAMINE-CHONDROITIN) TABS      Multiple Vitamin (MULITIVITAMIN WITH MINERALS) TABS      Omega-3 Fatty Acids (FISH OIL PO)      meloxicam (MOBIC) 15 MG tablet      Wheat Dextrin (BENEFIBER PO)        Follow-up Information    Follow up with ALUISIO,FRANK V. Schedule an appointment as soon as possible for a visit in 2 weeks. (Please have [facility] Staff help setup follow up appointment and transportation for patient.)    Contact information:   [facility] [address], [address] [place]  [address] [phone]          Signed: Patrica Duel 02/25/2011, 2:35 PM

## 2011-02-25 NOTE — Progress Notes (Signed)
Occupational Therapy Evaluation Patient Details Name: Saliah Crisp MRN: [id number] DOB: [birth year] Today's Date: 02/25/2011 EV2 1115-1130 Problem List:  Patient Active Problem List  Diagnoses  . Chronic diastolic heart failure  . Osteoarthritis of hip    Past Medical History:  Past Medical History  Diagnosis Date  . Syncope   . Diastolic dysfunction   . Hyperlipidemia   . Episode of dizziness     Mild episodes  . Orthostatic hypotension     Some episodes  . Diabetes mellitus     niddm  . Arthritis     oa  . Stress incontinence, female     wears pads  . AC (acromioclavicular) joint bone spurs     bone spurs in neck  . 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 Other surgical history     Hysterectomy  . Cholecystectomy 1992  . Abdominal hysterectomy 1985    1 ovary removed  . Total hip arthroplasty 02/23/2011    Procedure: TOTAL HIP ARTHROPLASTY;  Surgeon: Gus Rankin Aluisio;  Location: [facility] ORS;  Service: Orthopedics;  Laterality: Left;    OT Assessment/Plan/Recommendation OT Assessment Clinical Impression Statement: Pt would benefit from skilled OT to maximize I w/BADLs, improve standing activity tolerance in prep for d/c to next venue of care. OT Recommendation/Assessment: Patient will need skilled OT in the acute care venue OT Problem List: Decreased activity tolerance;Decreased safety awareness;Decreased knowledge of use of DME or AE Barriers to Discharge: Inaccessible home environment;Decreased caregiver support OT Therapy Diagnosis : Generalized weakness OT Plan OT Frequency: Min 1X/week OT Treatment/Interventions: Self-care/ADL training;DME and/or AE instruction;Therapeutic activities;Patient/family education OT Recommendation Follow Up Recommendations: Skilled nursing facility Equipment Recommended: Defer to next venue Individuals Consulted Consulted and Agree with Results  and Recommendations: Patient OT Goals Acute Rehab OT Goals OT Goal Formulation: With patient ADL Goals Pt Will Perform Grooming: with supervision;Standing at sink (X 3 tasks to improve standing activity tolerance.) ADL Goal: Grooming - Progress: Not met Pt Will Transfer to Toilet: with min assist;3-in-1;Ambulation ADL Goal: Toilet Transfer - Progress: Not met Pt Will Perform Toileting - Clothing Manipulation: with min assist;Standing ADL Goal: Toileting - Clothing Manipulation - Progress: Not met Pt Will Perform Toileting - Hygiene: with min assist;Sit to stand from 3-in-1/toilet ADL Goal: Toileting - Hygiene - Progress: Not met  OT Evaluation Precautions/Restrictions  Precautions Precautions: Posterior Hip Precaution Comments: sign hung in room Required Braces or Orthoses: No Restrictions Weight Bearing Restrictions: Yes LLE Weight Bearing: Partial weight bearing LLE Partial Weight Bearing Percentage or Pounds: 25-50% Prior Functioning Home Living Lives With: Alone Additional Comments: Home layout questions not asked due to d/c plan [facility]. Prior Function Level of Independence: Independent with basic ADLs;Needs assistance with gait;Independent with transfers;Requires assistive device for independence ADL ADL Grooming: Simulated;Set up Where Assessed - Grooming: Sitting, bed;Unsupported Upper Body Bathing: Simulated;Set up Where Assessed - Upper Body Bathing: Sitting, bed;Unsupported Lower Body Bathing: Simulated;Maximal assistance Where Assessed - Lower Body Bathing: Sit to stand from bed Upper Body Dressing: Simulated;Set up Where Assessed - Upper Body Dressing: Sitting, bed;Unsupported Lower Body Dressing: Performed;Moderate assistance Lower Body Dressing Details (indicate cue type and reason): Educated pt in use of hip kit to don/doff pants, socks, shoes Where Assessed - Lower Body Dressing: Sit to stand from bed Toilet Transfer: Not assessed Toilet Transfer Method:  Not assessed Toileting - Clothing Manipulation: Simulated;Maximal assistance Where Assessed - Toileting  Clothing Manipulation: Standing Toileting - Hygiene: Simulated;Maximal assistance Where Assessed - Toileting Hygiene: Standing Tub/Shower Transfer: Not assessed Tub/Shower Transfer Method: Not assessed Equipment Used: Reacher;Rolling walker;Sock aid Ambulation Related to ADLs: Pt does demo decreased standing activity tolerance. Vision/Perception  Vision - History Baseline Vision: Other (comment) (Pt states at baseline, she has no peripheral vision.) retinitis pigmentosa Patient Visual Report: No change from baseline Vision - Assessment Vision Assessment: Vision not tested Cognition Cognition Arousal/Alertness: Awake/alert Overall Cognitive Status: Appears within functional limits for tasks assessed Orientation Level: Oriented X4 Sensation/Coordination   Extremity Assessment RUE Assessment RUE Assessment: Within Functional Limits LUE Assessment LUE Assessment: Within Functional Limits Mobility  Bed Mobility Bed Mobility: Yes Supine to Sit: 3: Mod assist Supine to Sit Details (indicate cue type and reason): cues for sequence/technique Sit to Supine: 2: Max assist;HOB flat;With rail Sit to Supine - Details (indicate cue type and reason): cues for technique, THP. Transfers Transfers: Yes Sit to Stand: 1: +2 Total assist;From bed;With upper extremity assist Sit to Stand Details (indicate cue type and reason): cues for use of UEs and LE position -  Stand to Sit: 3: Mod assist;With upper extremity assist;To bed Stand to Sit Details: cues for UE/LE position, control descent. Exercises  End of Session OT - End of Session Equipment Utilized During Treatment: Other (comment) (RW, hip kit) Activity Tolerance: Patient tolerated treatment well Patient left: in bed;with call bell in reach;with family/visitor present General Behavior During Session: [facility] for tasks  performed Cognition: [facility] for tasks performed   Denajah Farias A [phone] 02/25/2011, 12:14 PM

## 2011-02-25 NOTE — Progress Notes (Signed)
[employer] has provided verbal approval for ST SNF at [facility] . Pt plans to d/c Sat via family transport. Week end CSW will assist with D/C planning to SNF.

## 2011-02-25 NOTE — Progress Notes (Signed)
Physical Therapy Treatment Patient Details Name: Angela Rosario MRN: [id number] DOB: Oct 23, 1940 Today's Date: 02/25/2011 0826 - 0900; GT, TE PT Assessment/Plan  PT - Assessment/Plan Comments on Treatment Session: c/o dizziness with amb - BP 135/77  PT Plan: Discharge plan remains appropriate PT Frequency: 7X/week Recommendations for Other Services: OT consult Follow Up Recommendations: Skilled nursing facility Equipment Recommended: Defer to next venue PT Goals  Acute Rehab PT Goals PT Goal Formulation: With patient Time For Goal Achievement: 7 days Pt will go Supine/Side to Sit: with supervision PT Goal: Supine/Side to Sit - Progress: Progressing toward goal Pt will go Sit to Supine/Side: with supervision Pt will go Sit to Stand: with supervision PT Goal: Sit to Stand - Progress: Progressing toward goal Pt will go Stand to Sit: with supervision PT Goal: Stand to Sit - Progress: Progressing toward goal Pt will Ambulate: 51 - 150 feet;with supervision;with rolling walker PT Goal: Ambulate - Progress: Progressing toward goal  PT Treatment Precautions/Restrictions  Precautions Precautions: Posterior Hip Precaution Comments: sign hung in room Restrictions Weight Bearing Restrictions: Yes LLE Weight Bearing: Partial weight bearing LLE Partial Weight Bearing Percentage or Pounds: 25-50% Mobility (including Balance) Bed Mobility Supine to Sit: 3: Mod assist Supine to Sit Details (indicate cue type and reason): cues for sequence/technique Sit to Supine: 1: +2 Total assist Transfers Sit to Stand: 1: +2 Total assist;From bed;With upper extremity assist Sit to Stand Details (indicate cue type and reason): cues for use of UEs and LE position -  Stand to Sit: 1: +2 Total assist;With armrests;To chair/3-in-1;With upper extremity assist Stand to Sit Details: cues for use of UEs and LE position -  Ambulation/Gait Ambulation/Gait Assistance: 1: +2 Total assist Ambulation/Gait  Assistance Details (indicate cue type and reason): cues for posture, position from RW and stride length Ambulation Distance (Feet): 65 Feet Assistive device: Rolling walker Gait Pattern: Step-to pattern    Exercise  Total Joint Exercises Ankle Circles/Pumps: AROM;20 reps;Both;Supine Gluteal Sets: AROM;Both;10 reps;5 reps;Supine Short Arc Quad: AROM;10 reps;5 reps;Left Heel Slides: AAROM;20 reps;Supine;Left Hip ABduction/ADduction: 20 reps;AAROM;Left;Supine End of Session PT - End of Session Activity Tolerance: Patient tolerated treatment well Patient left: in chair;with bed alarm set;with family/visitor present Nurse Communication: Mobility status for transfers;Mobility status for ambulation General Behavior During Session: [facility] for tasks performed Cognition: [facility] for tasks performed  Ashleymarie Granderson 02/25/2011, 9:18 AM

## 2011-02-25 NOTE — Progress Notes (Signed)
SNF bed available at [facility] on Sat if D/C Summary/ Med list completed on Fri . Info can be updated , as needed, on Sat and resent to [place] by weekend CSW. Waiting to hear back from [facility] for insurance prior approval.

## 2011-02-25 NOTE — Progress Notes (Signed)
Physical Therapy Treatment Patient Details Name: Angela Rosario MRN: [id number] DOB: [birth year] Today's Date: 02/25/2011 1045 - 1103; GT PT Assessment/Plan  PT - Assessment/Plan Comments on Treatment Session: no c/o dizziness PT Plan: Discharge plan remains appropriate PT Frequency: 7X/week Recommendations for Other Services: OT consult Follow Up Recommendations: Skilled nursing facility Equipment Recommended: Defer to next venue PT Goals  Acute Rehab PT Goals Time For Goal Achievement: 7 days Pt will go Supine/Side to Sit: with supervision Pt will go Sit to Supine/Side: with supervision Pt will go Sit to Stand: with supervision PT Goal: Sit to Stand - Progress: Progressing toward goal Pt will go Stand to Sit: with supervision PT Goal: Stand to Sit - Progress: Progressing toward goal Pt will Ambulate: 51 - 150 feet;with supervision;with rolling walker PT Goal: Ambulate - Progress: Progressing toward goal  PT Treatment Precautions/Restrictions  Precautions Precautions: Posterior Hip Precaution Comments: sign hung in room Required Braces or Orthoses: No Restrictions Weight Bearing Restrictions: Yes LLE Weight Bearing: Partial weight bearing LLE Partial Weight Bearing Percentage or Pounds: 25-50% Mobility (including Balance) Transfers Sit to Stand: 4: Min assist;3: Mod assist;With armrests;From chair/3-in-1;With upper extremity assist Sit to Stand Details (indicate cue type and reason): cues for use of UEs and LE position -  Stand to Sit: 3: Mod assist;With upper extremity assist;To bed Stand to Sit Details: cues for UE/LE position, control descent. Ambulation/Gait Ambulation/Gait Assistance: 1: +2 Total assist (pt 80%; +2 b/c of pt's extensive orthostatic hx) Ambulation/Gait Assistance Details (indicate cue type and reason): cues for posture and position from RW Ambulation Distance (Feet): 63 Feet (x2) Assistive device: Rolling walker Gait Pattern: Step-to pattern      Exercise    End of Session PT - End of Session Activity Tolerance: Patient tolerated treatment well Patient left: Other (comment) (seated at EOB with OT) General Behavior During Session: [facility] for tasks performed Cognition: [facility] for tasks performed  [name] 02/25/2011, 12:31 PM

## 2011-02-26 LAB — BASIC METABOLIC PANEL
BUN: 10 mg/dL (ref 6–23)
Chloride: 104 mEq/L (ref 96–112)
GFR calc Af Amer: >90 mL/min (ref >90)
GFR calc non Af Amer: >90 mL/min (ref >90)
Potassium: 3.7 mEq/L (ref 3.5–5.1)
Sodium: 139 mEq/L (ref 135–145)

## 2011-02-26 LAB — CBC
Hemoglobin: 9.9 g/dL — ABNORMAL LOW (ref 12.0–15.0)
MCH: 30.7 pg (ref 26.0–34.0)
MCHC: 34.1 g/dL (ref 30.0–36.0)
Platelets: 246 10*3/uL (ref 150–400)
RBC: 3.22 MIL/uL — ABNORMAL LOW (ref 3.87–5.11)

## 2011-02-26 NOTE — Progress Notes (Signed)
Physical Therapy Treatment Patient Details Name: Aarian Cleaver MRN: [id number] DOB: 1940-04-23 Today's Date: 02/26/2011 8:45- 9:11 2G   PT Assessment/Plan  PT - Assessment/Plan Comments on Treatment Session: no c/o dizziness, reviewed hip precautions, pt able to state 3/3 PT Plan: Discharge plan remains appropriate PT Frequency: 7X/week Recommendations for Other Services: OT consult Follow Up Recommendations: Skilled nursing facility Equipment Recommended: Defer to next venue PT Goals  Acute Rehab PT Goals PT Goal Formulation: With patient Time For Goal Achievement: 7 days Pt will go Supine/Side to Sit: with supervision PT Goal: Supine/Side to Sit - Progress: Progressing toward goal Pt will go Sit to Supine/Side: with supervision Pt will go Sit to Stand: with supervision PT Goal: Sit to Stand - Progress: Met Pt will go Stand to Sit: with supervision PT Goal: Stand to Sit - Progress: Met Pt will Ambulate: 51 - 150 feet;with supervision;with rolling walker PT Goal: Ambulate - Progress: Met  PT Treatment Precautions/Restrictions  Precautions Precautions: Posterior Hip Precaution Comments: sign hung in room Required Braces or Orthoses: No Restrictions Weight Bearing Restrictions: Yes LLE Weight Bearing: Partial weight bearing LLE Partial Weight Bearing Percentage or Pounds: 25-50% Mobility (including Balance) Bed Mobility Supine to Sit: 4: Min assist;HOB flat Supine to Sit Details (indicate cue type and reason): assist for LLE Transfers Transfers: Yes Sit to Stand: 5: Supervision;With upper extremity assist;From bed;From chair/3-in-1;With armrests Sit to Stand Details (indicate cue type and reason): VCs hand placement and to extend LLE Stand to Sit: 5: Supervision;With armrests;To chair/3-in-1 Stand to Sit Details: VCs hand placement Ambulation/Gait Ambulation/Gait: Yes Ambulation/Gait Assistance: 5: Supervision Ambulation/Gait Assistance Details (indicate cue type  and reason): VCs for flexed neck Ambulation Distance (Feet): 100 Feet Assistive device: Rolling walker Gait Pattern: Step-to pattern    Exercise  Total Joint Exercises Ankle Circles/Pumps: AROM;20 reps;Both;Supine End of Session PT - End of Session Activity Tolerance: Patient tolerated treatment well Patient left: in chair;with call bell in reach;with family/visitor present Nurse Communication: Mobility status for transfers;Mobility status for ambulation General Behavior During Session: [facility] for tasks performed Cognition: [facility] for tasks performed  Tamala Ser 02/26/2011, 10:38 AM

## 2011-02-26 NOTE — Progress Notes (Signed)
Angela Rosario  MRN: [id number] DOB/Age: 10/25/1940 71 y.o. Physician: Lynnea Maizes, M.D. 3 Days Post-Op Procedure(s) (LRB): TOTAL HIP ARTHROPLASTY (Left)  Subjective: Denies pain, anxious to go to [facility] Vital Signs Temp:  [98.1 F (36.7 C)-100.2 F (37.9 C)] 98.1 F (36.7 C) (01/12 0517) Pulse Rate:  [82-91] 82  (01/12 0517) Resp:  [14-16] 16  (01/12 0517) BP: (101-110)/(65-68) 106/68 mmHg (01/12 0517) SpO2:  [94 %-95 %] 95 % (01/12 0517)  Lab Results  Basename 02/26/11 0530 02/25/11 0356  WBC 11.2* 9.7  HGB 9.9* 10.1*  HCT 29.0* 29.1*  PLT 246 210   BMET  Basename 02/26/11 0530 02/25/11 0356  NA 139 134*  K 3.7 3.6  CL 104 99  CO2 25 28  GLUCOSE 166* 201*  BUN 10 8  CREATININE 0.58 0.68  CALCIUM 8.6 8.4   INR  Date Value Range Status  02/17/2011 0.94  0.00-1.49 (no units) Final     Exam  Walking in hallway, no c/o  Plan D/c to [facility] Dajaun Goldring M 02/26/2011, 9:24 AM

## 2011-02-26 NOTE — Progress Notes (Signed)
CSW covering for the weekend spoke with the pt and her family and pt is ready to discharge to [facility]. CSW spoke with Jasmine December at the facility and she reports they would like the to transport by 12 noon today so the pt can have therapy; CSW has spoken with the pts Revonda Standard and made her aware that the pt needs to discharge by noon. CSW has compiled documents needed to transport with pt. CSW assigned to the pt during the week had previously faxed documents to [facility] and she confirmed they were received. CSW signing off. Patrice Paradise, LCSWA 02/26/2011 10:34 AM [phone]

## 2011-02-26 NOTE — Progress Notes (Signed)
Patient discharged to [facility]. DC packet given to patient to take to nursing facility. No concerns voiced. Female friend in room at time of DC transported patient to facility. Left unit in wheelchair pushed by nurse tech to catch ride downstairs.  Left in good condition.

## 2011-04-14 ENCOUNTER — Other Ambulatory Visit: Payer: Self-pay | Admitting: *Deleted

## 2011-04-14 MED ORDER — CARVEDILOL 6.25 MG PO TABS: 6.2500 mg | ORAL_TABLET | Freq: Two times a day (BID) | ORAL | Status: DC

## 2011-04-29 ENCOUNTER — Other Ambulatory Visit: Payer: Self-pay | Admitting: Dermatology

## 2011-06-08 ENCOUNTER — Other Ambulatory Visit: Payer: Self-pay | Admitting: Dermatology

## 2011-10-12 ENCOUNTER — Other Ambulatory Visit: Payer: Self-pay | Admitting: Dermatology

## 2011-11-18 ENCOUNTER — Encounter: Payer: Self-pay | Admitting: Cardiovascular Disease

## 2012-01-30 ENCOUNTER — Other Ambulatory Visit: Payer: Self-pay | Admitting: Urology

## 2012-01-31 ENCOUNTER — Other Ambulatory Visit: Payer: Self-pay | Admitting: Urology

## 2012-01-31 ENCOUNTER — Encounter (HOSPITAL_BASED_OUTPATIENT_CLINIC_OR_DEPARTMENT_OTHER): Payer: Self-pay | Admitting: *Deleted

## 2012-01-31 MED ORDER — GENTAMICIN SULFATE 40 MG/ML IJ SOLN: 160.0000 mg | INTRAVENOUS | Status: AC

## 2012-01-31 NOTE — Progress Notes (Signed)
Pt instructed npo p mn 12/19 x coreg, mevacor w sip of water.  To [facility] 12/20 @ 1045.  Needs istat on arrival.  States has appt w/ Dr. Elease Hashimoto tomorrow.  Requested last office visit, most recent ekg, and cardiac studies from Dr. Harvie Bridge office.

## 2012-01-31 NOTE — Progress Notes (Signed)
Pt expressed concern re preop antibx.  States d/t her hip replacement, she was wondering if she needed special antibx prior to the procedure. Left message for Salita at [facility] urology re this concern.

## 2012-02-01 ENCOUNTER — Encounter: Payer: Self-pay | Admitting: Cardiovascular Disease

## 2012-02-01 ENCOUNTER — Ambulatory Visit (INDEPENDENT_AMBULATORY_CARE_PROVIDER_SITE_OTHER): Payer: Medicare Other | Admitting: Cardiovascular Disease

## 2012-02-01 VITALS — BP 140/94 | HR 75 | Ht 66.0 in | Wt 194.8 lb

## 2012-02-01 DIAGNOSIS — I5030 Unspecified diastolic (congestive) heart failure: Secondary | ICD-10-CM

## 2012-02-01 DIAGNOSIS — I509 Heart failure, unspecified: Secondary | ICD-10-CM

## 2012-02-01 DIAGNOSIS — I5032 Chronic diastolic (congestive) heart failure: Secondary | ICD-10-CM

## 2012-02-01 NOTE — Patient Instructions (Addendum)
Your physician wants you to follow-up in: 1 year.   You will receive a reminder letter in the mail two months in advance. If you don't receive a letter, please call our office to schedule the follow-up appointment.  Have your primary physician fax your labs to Dr Elease Hashimoto at [phone].

## 2012-02-01 NOTE — Assessment & Plan Note (Signed)
Angela Rosario is doing fairly well. She is getting along quite well after her hip replacement. She's not having any episodes of chest pain or severe shortness breath. I've encouraged her to exercise on a basis. We'll see her again in one year. We will have her medical doctor fax over her lab work.

## 2012-02-01 NOTE — Progress Notes (Signed)
Angela Rosario Date of Birth  [birth year] [facility] [address]    [address] [place], [place]  [address] [phone]  Fax  [phone]  1. Chronic diastolic congestive heart failure 2. Diabetes mellitus 3. History of orthostatic hypotension 4. History of left hip replacement ( Jan. 9, 2013)   History of Present Illness:  Angela Rosario is a 71 year old female with a history of diastolic dysfunction and diabetes mellitus.     She has been checking her blood pressure on occasion and her readings are typically normal.  Current Outpatient Prescriptions on File Prior to Visit  Medication Sig Dispense Refill  . acetaminophen (TYLENOL) 500 MG tablet Take 1-2 tablets (500-1,000 mg total) by mouth every 6 (six) hours as needed.  80 tablet  0  . carvedilol (COREG) 6.25 MG tablet Take 1 tablet (6.25 mg total) by mouth 2 (two) times daily with a meal.  180 tablet  2  . glipiZIDE-metformin (METAGLIP) 2.5-500 MG per tablet Take 1 tablet by mouth every morning.       . lovastatin (MEVACOR) 10 MG tablet Take 10 mg by mouth at bedtime.        Current Facility-Administered Medications on File Prior to Visit  Medication Dose Route Frequency Provider Last Rate Last Dose  . gentamicin (GARAMYCIN) 160 mg in dextrose 5 % 50 mL IVPB  160 mg Intravenous 30 min Pre-Op Marcine Matar, MD        Allergies  Allergen Reactions  . Oxycodone Other (See Comments)    hallucinations    Past Medical History  Diagnosis Date  . Syncope   . Diastolic dysfunction   . Hyperlipidemia   . Episode of dizziness     Mild episodes  . Orthostatic hypotension     Some episodes  . Arthritis     oa  . Stress incontinence, female     wears pads  . AC (acromioclavicular) joint bone spurs     bone spurs in neck  . 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 Diabetes mellitus     niddm; last A1C 6.2    Past Surgical History  Procedure Date  . Other surgical history    Hysterectomy  . Cholecystectomy 1992  . Abdominal hysterectomy 1985    1 ovary removed  . Total hip arthroplasty 02/23/2011    Procedure: TOTAL HIP ARTHROPLASTY;  Surgeon: Gus Rankin Aluisio;  Location: [facility] ORS;  Service: Orthopedics;  Laterality: Left;    History  Smoking status  . Former Smoker -- 0.2 packs/day for 1 years  . Quit date: 02/14/1957  Smokeless tobacco  . Never Used    History  Alcohol Use No    No family history on file.  Reviw of Systems:  Reviewed in the HPI.  All other systems are negative.  Physical Exam: BP 140/94  Pulse 75  Ht 5\' 6"  (1.676 m)  Wt 194 lb 12.8 oz (88.361 kg)  BMI 31.44 kg/m2 The patient is alert and oriented x 3.  The mood and affect are normal.   Skin: warm and dry.  Color is normal.    HEENT:   [place]/AT, no JVD  Lungs: clear   Heart: RR,  Distant heart sounds  Abdomen: soft, + BS  Extremities:  No c/c/e  Neuro:  Non focal    ECG: 02/01/2012-normal sinus rhythm at 75 beats a minute. Possible LAE, LVH,   Assessment / Plan:

## 2012-02-03 ENCOUNTER — Encounter (HOSPITAL_BASED_OUTPATIENT_CLINIC_OR_DEPARTMENT_OTHER): Payer: Self-pay | Admitting: *Deleted

## 2012-02-03 ENCOUNTER — Encounter (HOSPITAL_BASED_OUTPATIENT_CLINIC_OR_DEPARTMENT_OTHER): Payer: Self-pay | Admitting: Anesthesiology

## 2012-02-03 ENCOUNTER — Ambulatory Visit (HOSPITAL_BASED_OUTPATIENT_CLINIC_OR_DEPARTMENT_OTHER)
Admission: RE | Admit: 2012-02-03 | Discharge: 2012-02-03 | Disposition: A | Discharge status: Home / Self Care | Payer: Medicare Other | Source: Ambulatory Visit | Attending: Urology | Admitting: Urology

## 2012-02-03 ENCOUNTER — Ambulatory Visit (HOSPITAL_BASED_OUTPATIENT_CLINIC_OR_DEPARTMENT_OTHER): Payer: Medicare Other | Admitting: Anesthesiology

## 2012-02-03 ENCOUNTER — Encounter (HOSPITAL_BASED_OUTPATIENT_CLINIC_OR_DEPARTMENT_OTHER)
Admission: RE | Disposition: A | Discharge status: Home / Self Care | Payer: Self-pay | Source: Ambulatory Visit | Attending: Urology

## 2012-02-03 DIAGNOSIS — N393 Stress incontinence (female) (male): Secondary | ICD-10-CM | POA: Insufficient documentation

## 2012-02-03 DIAGNOSIS — Z90710 Acquired absence of both cervix and uterus: Secondary | ICD-10-CM | POA: Insufficient documentation

## 2012-02-03 DIAGNOSIS — E785 Hyperlipidemia, unspecified: Secondary | ICD-10-CM | POA: Insufficient documentation

## 2012-02-03 DIAGNOSIS — E119 Type 2 diabetes mellitus without complications: Secondary | ICD-10-CM | POA: Insufficient documentation

## 2012-02-03 DIAGNOSIS — Z79899 Other long term (current) drug therapy: Secondary | ICD-10-CM | POA: Insufficient documentation

## 2012-02-03 HISTORY — PX: PUBOVAGINAL SLING: SHX1035

## 2012-02-03 LAB — POCT I-STAT 4, (NA,K, GLUC, HGB,HCT)
Glucose, Bld: 136 mg/dL — ABNORMAL HIGH (ref 70–99)
HCT: 42 % (ref 36.0–46.0)
Potassium: 4.1 mEq/L (ref 3.5–5.1)

## 2012-02-03 SURGERY — CREATION, PUBOVAGINAL SLING
Anesthesia: General | Site: Vagina | Wound class: Clean Contaminated

## 2012-02-03 MED ORDER — ONDANSETRON HCL 4 MG/2ML IJ SOLN
INTRAMUSCULAR | Status: DC | PRN
  Administered 2012-02-03: 4 mg via INTRAVENOUS

## 2012-02-03 MED ORDER — CEFAZOLIN SODIUM-DEXTROSE 2-3 GM-% IV SOLR
INTRAVENOUS | Status: DC | PRN
  Administered 2012-02-03: 2 g via INTRAVENOUS

## 2012-02-03 MED ORDER — GENTAMICIN SULFATE 40 MG/ML IJ SOLN: 440.0000 mg | INTRAVENOUS | Status: DC | PRN

## 2012-02-03 MED ORDER — 0.9 % SODIUM CHLORIDE (POUR BTL) OPTIME
TOPICAL | Status: DC | PRN
  Administered 2012-02-03: 500 mL

## 2012-02-03 MED ORDER — ONDANSETRON HCL 4 MG/2ML IJ SOLN
4.0000 mg | Freq: Four times a day (QID) | INTRAMUSCULAR | Status: DC | PRN
  Filled 2012-02-03: qty 2

## 2012-02-03 MED ORDER — LIDOCAINE HCL (CARDIAC) 20 MG/ML IV SOLN
INTRAVENOUS | Status: DC | PRN
  Administered 2012-02-03: 70 mg via INTRAVENOUS

## 2012-02-03 MED ORDER — FENTANYL CITRATE 0.05 MG/ML IJ SOLN
25.0000 ug | INTRAMUSCULAR | Status: DC | PRN
  Administered 2012-02-03 (×2): 25 ug via INTRAVENOUS
  Filled 2012-02-03: qty 1

## 2012-02-03 MED ORDER — GENTAMICIN SULFATE 40 MG/ML IJ SOLN
440.0000 mg | INTRAVENOUS | Status: DC | PRN
  Administered 2012-02-03: 350 mg via INTRAVENOUS

## 2012-02-03 MED ORDER — FENTANYL CITRATE 0.05 MG/ML IJ SOLN
INTRAMUSCULAR | Status: DC | PRN
  Administered 2012-02-03: 50 ug via INTRAVENOUS
  Administered 2012-02-03: 25 ug via INTRAVENOUS
  Administered 2012-02-03: 50 ug via INTRAVENOUS

## 2012-02-03 MED ORDER — ACETAMINOPHEN 325 MG PO TABS
650.0000 mg | ORAL_TABLET | ORAL | Status: DC | PRN
  Filled 2012-02-03: qty 2

## 2012-02-03 MED ORDER — PROMETHAZINE HCL 25 MG/ML IJ SOLN
6.2500 mg | INTRAMUSCULAR | Status: DC | PRN
  Filled 2012-02-03: qty 1

## 2012-02-03 MED ORDER — CIPROFLOXACIN HCL 250 MG PO TABS: 250.0000 mg | ORAL_TABLET | Freq: Two times a day (BID) | ORAL | Status: DC

## 2012-02-03 MED ORDER — CEFAZOLIN SODIUM 1-5 GM-% IV SOLN
1.0000 g | INTRAVENOUS | Status: DC
  Filled 2012-02-03: qty 50

## 2012-02-03 MED ORDER — GENTAMICIN SULFATE 40 MG/ML IJ SOLN
350.0000 mg | Freq: Once | INTRAVENOUS | Status: DC
  Filled 2012-02-03 (×2): qty 8.75

## 2012-02-03 MED ORDER — TRAMADOL HCL 50 MG PO TABS: 50.0000 mg | ORAL_TABLET | Freq: Four times a day (QID) | ORAL | Status: DC | PRN

## 2012-02-03 MED ORDER — ACETAMINOPHEN 650 MG RE SUPP
650.0000 mg | RECTAL | Status: DC | PRN
  Filled 2012-02-03: qty 1

## 2012-02-03 MED ORDER — LIDOCAINE-EPINEPHRINE (PF) 1 %-1:200000 IJ SOLN
INTRAMUSCULAR | Status: DC | PRN
  Administered 2012-02-03: 9 mL

## 2012-02-03 MED ORDER — STERILE WATER FOR IRRIGATION IR SOLN
Status: DC | PRN
  Administered 2012-02-03: 3000 mL

## 2012-02-03 MED ORDER — LACTATED RINGERS IV SOLN
INTRAVENOUS | Status: DC | PRN
  Administered 2012-02-03 (×2): via INTRAVENOUS

## 2012-02-03 MED ORDER — PROPOFOL 10 MG/ML IV BOLUS
INTRAVENOUS | Status: DC | PRN
  Administered 2012-02-03: 170 mg via INTRAVENOUS

## 2012-02-03 MED ORDER — LACTATED RINGERS IV SOLN
INTRAVENOUS | Status: DC
  Administered 2012-02-03: 11:00:00 via INTRAVENOUS
  Filled 2012-02-03: qty 1000

## 2012-02-03 MED ORDER — SODIUM CHLORIDE 0.9 % IV SOLN
250.0000 mL | INTRAVENOUS | Status: DC | PRN
  Filled 2012-02-03: qty 250

## 2012-02-03 MED ORDER — MORPHINE SULFATE 2 MG/ML IJ SOLN
1.0000 mg | INTRAMUSCULAR | Status: DC | PRN
  Filled 2012-02-03: qty 1

## 2012-02-03 MED ORDER — SODIUM CHLORIDE 0.9 % IJ SOLN
3.0000 mL | INTRAMUSCULAR | Status: DC | PRN
  Filled 2012-02-03: qty 3

## 2012-02-03 MED ORDER — ACETAMINOPHEN 10 MG/ML IV SOLN
1000.0000 mg | Freq: Four times a day (QID) | INTRAVENOUS | Status: DC
  Filled 2012-02-03: qty 100

## 2012-02-03 MED ORDER — TRAMADOL HCL 50 MG PO TABS
50.0000 mg | ORAL_TABLET | Freq: Four times a day (QID) | ORAL | Status: DC | PRN
  Administered 2012-02-03: 50 mg via ORAL
  Filled 2012-02-03: qty 1

## 2012-02-03 MED ORDER — CEFAZOLIN SODIUM-DEXTROSE 2-3 GM-% IV SOLR
2.0000 g | INTRAVENOUS | Status: DC
  Filled 2012-02-03: qty 50

## 2012-02-03 MED ORDER — SODIUM CHLORIDE 0.9 % IJ SOLN
3.0000 mL | Freq: Two times a day (BID) | INTRAMUSCULAR | Status: DC
  Filled 2012-02-03: qty 3

## 2012-02-03 SURGICAL SUPPLY — 36 items
ADH SKN CLS APL DERMABOND .7 (GAUZE/BANDAGES/DRESSINGS) ×1
BAG DRAIN URO-CYSTO SKYTR STRL (DRAIN) ×2 IMPLANT
BAG DRN ANRFLXCHMBR STRAP LEK (BAG) ×1
BAG DRN UROCATH (DRAIN) ×1
BAG URINE LEG 19OZ MD ST LTX (BAG) ×1 IMPLANT
BLADE SURG 15 STRL LF DISP TIS (BLADE) ×1 IMPLANT
BLADE SURG 15 STRL SS (BLADE) ×2
BLADE SURG ROTATE 9660 (MISCELLANEOUS) ×1 IMPLANT
CANISTER SUCT LVC 12 LTR MEDI- (MISCELLANEOUS) ×2 IMPLANT
CANISTER SUCTION 1200CC (MISCELLANEOUS) ×2 IMPLANT
CATH FOLEY 2WAY SLVR  5CC 16FR (CATHETERS) ×1
CATH FOLEY 2WAY SLVR 5CC 16FR (CATHETERS) ×1 IMPLANT
CLOTH BEACON ORANGE TIMEOUT ST (SAFETY) ×2 IMPLANT
COVER MAYO STAND STRL (DRAPES) ×2 IMPLANT
DERMABOND ADVANCED (GAUZE/BANDAGES/DRESSINGS) ×1
DERMABOND ADVANCED .7 DNX12 (GAUZE/BANDAGES/DRESSINGS) ×1 IMPLANT
ELECT REM PT RETURN 9FT ADLT (ELECTROSURGICAL) ×2
ELECTRODE REM PT RTRN 9FT ADLT (ELECTROSURGICAL) IMPLANT
GLOVE BIO SURGEON STRL SZ8 (GLOVE) ×2 IMPLANT
GOWN STRL REIN XL XLG (GOWN DISPOSABLE) ×2 IMPLANT
GOWN XL W/COTTON TOWEL STD (GOWNS) ×2 IMPLANT
NDL HYPO 25X1 1.5 SAFETY (NEEDLE) ×1 IMPLANT
NEEDLE HYPO 25X1 1.5 SAFETY (NEEDLE) ×2 IMPLANT
NS IRRIG 500ML POUR BTL (IV SOLUTION) IMPLANT
PACK BASIN DAY SURGERY FS (CUSTOM PROCEDURE TRAY) ×1 IMPLANT
PACK CYSTOSCOPY (CUSTOM PROCEDURE TRAY) ×2 IMPLANT
PACKING VAGINAL (PACKING) IMPLANT
PENCIL BUTTON HOLSTER BLD 10FT (ELECTRODE) ×1 IMPLANT
PLUG CATH AND CAP STER (CATHETERS) ×2 IMPLANT
SUT VIC AB 2-0 UR5 27 (SUTURE) ×4 IMPLANT
SYR BULB IRRIGATION 50ML (SYRINGE) ×2 IMPLANT
SYRINGE 10CC LL (SYRINGE) ×2 IMPLANT
TUBE CONNECTING 12X1/4 (SUCTIONS) ×2 IMPLANT
WATER STERILE IRR 500ML POUR (IV SOLUTION) IMPLANT
YANKAUER SUCT BULB TIP NO VENT (SUCTIONS) ×2 IMPLANT
lynx sling ×1 IMPLANT

## 2012-02-03 NOTE — Anesthesia Postprocedure Evaluation (Signed)
Anesthesia Post Note  Patient: Angela Rosario  Procedure(s) Performed: Procedure(s) (LRB): PUBO-VAGINAL SLING (N/A)  Anesthesia type: General  Patient location: PACU  Post pain: Pain level controlled  Post assessment: Post-op Vital signs reviewed  Last Vitals:  Filed Vitals:   02/03/12 1400  BP: 143/71  Pulse: 66  Temp:   Resp: 8    Post vital signs: Reviewed  Level of consciousness: sedated  Complications: No apparent anesthesia complications

## 2012-02-03 NOTE — Op Note (Signed)
Preoperative diagnosis: Stress urinary incontinence  Postoperative diagnosis: Same  Procedure: Mid urethral sling placement using the [place] mesh system  Anesthesia: Gen.  Complications: None  Drains: None  Indications:Ms. Geraldo Pitter is a 71 year old female with symptomatic stress urinary incontinence. Evaluation has revealed no underlying bladder abnormality, and no significant prolapse. Because of her significant, symptomatic leakage, she requests surgical treatment. Other methods have been discussed with her, including Kegel exercises, pelvic floor/physical therapy. She desires to proceed with surgical management with a mid urethral sling at this point. Risks and complications have been discussed with her. These include, but are not limited to infection, bleeding, extrusion/erosion of the sling, continued leakage, retention, among others. She understands these and desires to proceed.  Procedure: The patient was identified in the holding area and received preoperative IV antibiotics. She was taken to the operating room where general anesthesia was administered with the LMA. She was placed in the dorsolithotomy. Lower abdomen, genitalia and perineum were prepped. Timeout was then performed. A posterior weighted vaginal speculum was in place. Inspection of the vagina was then performed. No significant abnormalities were noted. The urethra was catheterized with a 16 [place] Foley, with 10 cc of water placed in the balloon. An Allis clamp was then placed on the urethral meatus, with anterior traction performed. This stretched out the anterior vaginal wall. The sub cutaneous tissue was then infiltrated with approximately 10 cc of 1% lidocaine with epinephrine. This was placed both in the midline and laterally toward the pubocervical fascia. Following infiltration with the lidocaine, a 1.5 cm incision was then made, starting at a position just inferior to what would be the bladder neck area, and carried down to  approximately 1 cm proximal to the urethral meatus. Dissection was then carried bilaterally sharply, dissecting to the pubocervical fascia bilaterally. Care was taken to avoid injury to the underlying urethra. Blunt dissection was used to assist with this dissection. Following adequate dissection bilaterally, 2 punctures were then placed approximately 1 cm above the pubic symphysis, approximately 1.5 cm each lateral to the midline with the 15 blade. 10 cc of 1% lidocaine with epinephrine were then used to infiltrate these areas. At this point, with a finger on the right side of the vaginal dissection, the suprapubic needle was then passed through the right puncture site, directly behind the right pubic bone through the space of Retzius, and then popped through the pubocervical fascia. The same procedure was done through the left suprapubic puncture site, to the left of the vaginal dissection. Once both needles were passed through, cystoscopic inspection was carried out in the bladder. The bladder neck was open. There were no urothelial lesions of the bladder. Both ureteral orifices were normal in configuration and location. There were no trabeculations. The impression of the needles could be seen bilaterally with significant downward pressure on the needles from above, but in no punctures were seen. Careful inspection was made with both the 12 and 70 lenses. Following cystoscopic exam, approximately 150 cc of water was left in the bladder to assist with a postoperative voiding trial. At this point, the ends of the mesh sling were passed onto the needles, and the sling was pulled anteriorly, leaving approximately 1 cm of "slack" underneath the urethra. Following adequate positioning of the mesh sling underneath the urethra, the vaginal fornices were inspected. There was no "buttonholing" of the vaginal fornices with the needles. At this point, the sling sheath was trimmed/removed. Careful inspection of the sling  underneath the urethra revealed adequate  positioning and an adequate gap around the urethra. The ends of the sling at the skin were carefully trimmed and pushed deep into the subcutaneous tissues. At this point, there was no significant bleeding through the vaginal incision or through the suprapubic punctures. The vaginal incision was closed after irrigation with a running 0 Vicryl placed in a simple fashion. The catheter was removed. Downward pressure on the bladder revealed some leakage of water with the Cred maneuver. The puncture sites were clean, and closed with Dermabond. At this point, the vagina was irrigated, and the procedure terminated. The patient was awakened and taken to the PACU in stable condition. Sponge, needle and instrument counts were correct times 2.

## 2012-02-03 NOTE — Interval H&P Note (Signed)
History and Physical Interval Note:  02/03/2012 12:02 PM  Angela Rosario  has presented today for surgery, with the diagnosis of STRESS URINARY INCONTINENCE  The various methods of treatment have been discussed with the patient and family. After consideration of risks, benefits and other options for treatment, the patient has consented to  Procedure(s) (LRB) with comments: PUBO-VAGINAL SLING (N/A) - 1 HR  LYNX SLING as a surgical intervention .  The patient's history has been reviewed, patient examined, no change in status, stable for surgery.  I have reviewed the patient's chart and labs.  Questions were answered to the patient's satisfaction.     Chelsea Aus

## 2012-02-03 NOTE — Anesthesia Procedure Notes (Signed)
Procedure Name: LMA Insertion Date/Time: 02/03/2012 12:22 PM Performed by: Jessica Priest Pre-anesthesia Checklist: Patient identified, Emergency Drugs available, Suction available and Patient being monitored Patient Re-evaluated:Patient Re-evaluated prior to inductionOxygen Delivery Method: Circle System Utilized Preoxygenation: Pre-oxygenation with 100% oxygen Intubation Type: IV induction Ventilation: Mask ventilation without difficulty LMA: LMA inserted LMA Size: 4.0 Number of attempts: 1 Airway Equipment and Method: bite block Placement Confirmation: positive ETCO2 Tube secured with: Tape Dental Injury: Teeth and Oropharynx as per pre-operative assessment

## 2012-02-03 NOTE — H&P (Signed)
H&P  Chief Complaint: Stress incontinence  History of Present Illness: Angela Rosario is a 71 y.o. year old female who presents for placement of a mid urethral sling. She initially presented in November2013 for complaints of urinary leakage. For the past few years, getting worse more recently, she has stress incontinence. She has leakage with coughing, sneezing, laughing, doing minimal exertional activity, and sometimes leakage with no significant activity. She usually has a good stream, feels like she empties well. She has mild urgency, no frequency or urgency incontinence. She can sometimes last 4-5 hours between voids. She has nocturia x2. She has a good stream, usually feels like she empties well. She does not have frequent urinary tract infections. She has no discomfort with urination. That she knows of, she has not been told that she had a dropped bladder before. She wears 2-3 pads a day. These are usually not soaked, but do hold a fair amount of urine. She also has some fecal incontinence. Gaynell Face test was positive and cystoscopy was negative.   She has been counseled in management of SUI--pelvic floor therapy, kegel exercises as well as surgical treatment. She desires to proceed with a mid urethral sling, having been informed in the risks/compllications, including but not limited to infection, retention, erosion/extrusion of mesh, bleeding and anesthetic complications. She understands these and desires to proceed.  Past Medical History  Diagnosis Date  . Syncope   . Diastolic dysfunction   . Hyperlipidemia   . Episode of dizziness     Mild episodes  . Orthostatic hypotension     Some episodes  . Arthritis     oa  . Stress incontinence, female     wears pads  . AC (acromioclavicular) joint bone spurs     bone spurs in neck  . 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 Diabetes mellitus     niddm; last A1C 6.2    Past Surgical History  Procedure Date  .  Other surgical history     Hysterectomy  . Cholecystectomy 1992  . Abdominal hysterectomy 1985    1 ovary removed  . Total hip arthroplasty 02/23/2011    Procedure: TOTAL HIP ARTHROPLASTY;  Surgeon: Gus Rankin Aluisio;  Location: [facility];  Service: Orthopedics;  Laterality: Left;    Home Medications:  No prescriptions prior to admission    Allergies:  Allergies  Allergen Reactions  . Oxycodone Other (See Comments)    hallucinations    History reviewed. No pertinent family history.  Social History:  reports that she quit smoking about 55 years ago. She has never used smokeless tobacco. She reports that she does not drink alcohol or use illicit drugs.  ROS: Genitourinary: urinary urgency, nocturia and incontinence.  Gastrointestinal: heartburn and diarrhea.  Constitutional: recent weight loss.  Integumentary: skin rash/lesion.  Eyes: blurred vision.  Respiratory: cough. .  Physical Exam:  Vital signs in last 24 hours:   Constitutional: Well nourished and well developed . No acute distress.  ENT:. The ears and nose are normal in appearance.  Neck: The appearance of the neck is normal and no neck mass is present.  Pulmonary: No respiratory distress and normal respiratory rhythm and effort.  Cardiovascular: Heart rate and rhythm are normal . No peripheral edema.  Abdomen: The abdomen is rounded. The abdomen is soft and nontender. No masses are palpated. No CVA tenderness. No hernias are palpable. No hepatosplenomegaly noted.  Genitourinary:  Chaperone Present: Alfonzo Beers.  Examination  of the external genitalia shows normal female external genitalia and no lesions. The urethra is normal in appearance and not tender. There is no urethral mass. Vaginal exam demonstrates the vaginal epithelium to be poorly estrogenized, but no abnormalities. No cystocele is identified. No rectocele is identified. The cervix is is without abnormalities. The uterus is without abnormalities. The adnexa  are palpably normal. The bladder is normal on palpation, non tender and not distended. The anus is normal on inspection. The perineum is normal on inspection.  Lymphatics: The femoral and inguinal nodes are not enlarged or tender.  Skin: Normal skin turgor, no visible rash and no visible skin lesions.  Neuro/Psych:. Mood and affect are appropriate.      Laboratory Data:  No results found for this or any previous visit (from the past 24 hour(s)). No results found for this or any previous visit (from the past 240 hour(s)). Creatinine: No results found for this basename: CREATININE:7 in the last 168 hours  Radiologic Imaging: No results found.  Impression/Assessment:  Stress urinary incontinence  Plan:  [facility] midurethral sling  Chelsea Aus 02/03/2012, 6:50 AM  Bertram Millard. Daesia Zylka MD

## 2012-02-03 NOTE — Anesthesia Preprocedure Evaluation (Signed)
Anesthesia Evaluation  Patient identified by MRN, date of birth, ID band Patient awake    Reviewed: Allergy & Precautions, H&P , NPO status , Patient's Chart, lab work & pertinent test results, reviewed documented beta blocker date and time   Airway Mallampati: II TM Distance: >3 FB Neck ROM: full    Dental No notable dental hx.    Pulmonary neg pulmonary ROS,  breath sounds clear to auscultation  Pulmonary exam normal       Cardiovascular Exercise Tolerance: Good hypertension, On Home Beta Blockers Rhythm:regular Rate:Normal  Diastolic dysfunction. Syncope. Orthostatic hypotension.   Neuro/Psych negative neurological ROS  negative psych ROS   GI/Hepatic negative GI ROS, Neg liver ROS,   Endo/Other  negative endocrine ROSdiabetes, Type 2, Oral Hypoglycemic Agents  Renal/GU negative Renal ROS  negative genitourinary   Musculoskeletal   Abdominal   Peds  Hematology negative hematology ROS (+)   Anesthesia Other Findings   Reproductive/Obstetrics negative OB ROS                           Anesthesia Physical Anesthesia Plan  ASA: III  Anesthesia Plan: General LMA   Post-op Pain Management:    Induction:   Airway Management Planned:   Additional Equipment:   Intra-op Plan:   Post-operative Plan:   Informed Consent: I have reviewed the patients History and Physical, chart, labs and discussed the procedure including the risks, benefits and alternatives for the proposed anesthesia with the patient or authorized representative who has indicated his/her understanding and acceptance.   Dental Advisory Given  Plan Discussed with: CRNA  Anesthesia Plan Comments:         Anesthesia Quick Evaluation

## 2012-02-03 NOTE — Transfer of Care (Signed)
Immediate Anesthesia Transfer of Care Note  Patient: Angela Rosario  Procedure(s) Performed: Procedure(s) (LRB): PUBO-VAGINAL SLING (N/A)  Patient Location: Patient transported to PACU with oxygen via face mask at 4 Liters / Min  Anesthesia Type: General  Level of Consciousness: awake and alert   Airway & Oxygen Therapy: Patient Spontanous Breathing and Patient connected to face mask oxygen  Post-op Assessment: Report given to PACU RN and Post -op Vital signs reviewed and stable  Post vital signs: Reviewed and stable  Dentition: Teeth and oropharynx remain in pre-op condition  Complications: No apparent anesthesia complications

## 2012-02-06 ENCOUNTER — Encounter (HOSPITAL_BASED_OUTPATIENT_CLINIC_OR_DEPARTMENT_OTHER): Payer: Self-pay | Admitting: Urology

## 2012-05-17 ENCOUNTER — Other Ambulatory Visit: Payer: Self-pay | Admitting: *Deleted

## 2012-05-17 MED ORDER — CARVEDILOL 6.25 MG PO TABS: 6.2500 mg | ORAL_TABLET | Freq: Two times a day (BID) | ORAL | Status: DC

## 2012-07-19 ENCOUNTER — Other Ambulatory Visit: Payer: Self-pay | Admitting: Dermatology

## 2012-09-12 ENCOUNTER — Other Ambulatory Visit: Payer: Self-pay | Admitting: Radiology

## 2012-09-13 ENCOUNTER — Other Ambulatory Visit: Payer: Self-pay | Admitting: Radiology

## 2012-09-13 DIAGNOSIS — C50911 Malignant neoplasm of unspecified site of right female breast: Secondary | ICD-10-CM

## 2012-09-14 ENCOUNTER — Telehealth: Payer: Self-pay | Admitting: *Deleted

## 2012-09-14 DIAGNOSIS — C50111 Malignant neoplasm of central portion of right female breast: Secondary | ICD-10-CM

## 2012-09-14 NOTE — Telephone Encounter (Signed)
Left message for a return phone call to schedule for [facility] on 09/26/12

## 2012-09-14 NOTE — Telephone Encounter (Signed)
Received a return phone call from patient and confirmed apt for 09/26/12 at 12N.  Instructions and contact information given.

## 2012-09-18 ENCOUNTER — Ambulatory Visit
Admission: RE | Admit: 2012-09-18 | Discharge: 2012-09-18 | Disposition: A | Discharge status: Home / Self Care | Payer: Medicare Other | Source: Ambulatory Visit | Attending: Radiology | Admitting: Radiology

## 2012-09-18 DIAGNOSIS — C50911 Malignant neoplasm of unspecified site of right female breast: Secondary | ICD-10-CM

## 2012-09-18 MED ORDER — GADOBENATE DIMEGLUMINE 529 MG/ML IV SOLN
17.0000 mL | Freq: Once | INTRAVENOUS | Status: AC | PRN
  Administered 2012-09-18: 17 mL via INTRAVENOUS

## 2012-09-26 ENCOUNTER — Other Ambulatory Visit (HOSPITAL_BASED_OUTPATIENT_CLINIC_OR_DEPARTMENT_OTHER): Payer: Medicare Other | Admitting: Lab

## 2012-09-26 ENCOUNTER — Encounter: Payer: Self-pay | Admitting: Radiation Oncology

## 2012-09-26 ENCOUNTER — Ambulatory Visit
Admission: RE | Admit: 2012-09-26 | Discharge: 2012-09-26 | Disposition: A | Discharge status: Home / Self Care | Payer: Medicare Other | Source: Ambulatory Visit | Attending: Radiation Oncology | Admitting: Radiation Oncology

## 2012-09-26 ENCOUNTER — Ambulatory Visit: Payer: Medicare Other

## 2012-09-26 ENCOUNTER — Encounter: Payer: Self-pay | Admitting: *Deleted

## 2012-09-26 ENCOUNTER — Ambulatory Visit (HOSPITAL_BASED_OUTPATIENT_CLINIC_OR_DEPARTMENT_OTHER): Payer: Medicare Other | Admitting: General Surgery

## 2012-09-26 ENCOUNTER — Ambulatory Visit (HOSPITAL_BASED_OUTPATIENT_CLINIC_OR_DEPARTMENT_OTHER): Payer: Medicare Other | Admitting: Oncology

## 2012-09-26 ENCOUNTER — Encounter: Payer: Self-pay | Admitting: Oncology

## 2012-09-26 ENCOUNTER — Encounter (INDEPENDENT_AMBULATORY_CARE_PROVIDER_SITE_OTHER): Payer: Self-pay | Admitting: General Surgery

## 2012-09-26 VITALS — BP 154/80 | HR 69 | Temp 97.7°F | Resp 18 | Ht 66.0 in | Wt 191.2 lb

## 2012-09-26 DIAGNOSIS — E119 Type 2 diabetes mellitus without complications: Secondary | ICD-10-CM

## 2012-09-26 DIAGNOSIS — C50111 Malignant neoplasm of central portion of right female breast: Secondary | ICD-10-CM

## 2012-09-26 DIAGNOSIS — C50419 Malignant neoplasm of upper-outer quadrant of unspecified female breast: Secondary | ICD-10-CM

## 2012-09-26 DIAGNOSIS — Z170 Estrogen receptor positive status [ER+]: Secondary | ICD-10-CM

## 2012-09-26 DIAGNOSIS — C50119 Malignant neoplasm of central portion of unspecified female breast: Secondary | ICD-10-CM

## 2012-09-26 DIAGNOSIS — I10 Essential (primary) hypertension: Secondary | ICD-10-CM

## 2012-09-26 LAB — COMPREHENSIVE METABOLIC PANEL (CC13)
ALT: 11 U/L (ref 0–55)
Albumin: 4.1 g/dL (ref 3.5–5.0)
Alkaline Phosphatase: 85 U/L (ref 40–150)
Glucose: 118 mg/dl (ref 70–140)
Potassium: 3.9 mEq/L (ref 3.5–5.1)
Sodium: 144 mEq/L (ref 136–145)
Total Bilirubin: 0.56 mg/dL (ref 0.20–1.20)
Total Protein: 7 g/dL (ref 6.4–8.3)

## 2012-09-26 LAB — CBC WITH DIFFERENTIAL/PLATELET
BASO%: 0.6 % (ref 0.0–2.0)
Eosinophils Absolute: 0.2 10*3/uL (ref 0.0–0.5)
LYMPH%: 29.6 % (ref 14.0–49.7)
MCHC: 34.6 g/dL (ref 31.5–36.0)
MCV: 91.4 fL (ref 79.5–101.0)
MONO#: 0.7 10*3/uL (ref 0.1–0.9)
MONO%: 7.8 % (ref 0.0–14.0)
NEUT#: 5.1 10*3/uL (ref 1.5–6.5)
RBC: 4.37 10*6/uL (ref 3.70–5.45)
RDW: 13.8 % (ref 11.2–14.5)
WBC: 8.5 10*3/uL (ref 3.9–10.3)

## 2012-09-26 MED ORDER — LORAZEPAM 0.5 MG PO TABS: 0.5000 mg | ORAL_TABLET | Freq: Four times a day (QID) | ORAL | Status: DC | PRN

## 2012-09-26 MED ORDER — ONDANSETRON HCL 8 MG PO TABS: 8.0000 mg | ORAL_TABLET | Freq: Two times a day (BID) | ORAL | Status: DC

## 2012-09-26 MED ORDER — DEXAMETHASONE 4 MG PO TABS: 8.0000 mg | ORAL_TABLET | Freq: Two times a day (BID) | ORAL | Status: DC

## 2012-09-26 MED ORDER — PROCHLORPERAZINE MALEATE 10 MG PO TABS: 10.0000 mg | ORAL_TABLET | Freq: Four times a day (QID) | ORAL | Status: DC | PRN

## 2012-09-26 NOTE — Progress Notes (Signed)
Patient ID: Angela Rosario, female   DOB: 11-30-1940, 72 y.o.   MRN: [id number]  Chief Complaint  Patient presents with  . Other    HPI Angela Rosario is a 72 y.o. female.  Referred by Dr Wylene Simmer HPI 52 yof who palpated right breast mass not long after she had a nl mm.  This is near areola.  She underwent mm that showed mass present at site of palpable area.  An u/s showed a 7 mm lesion at 12 o'clcok.  MR shows a 1.5x1.7x1.5 cm right sided breast mass with no abnormal nodes or left breast abnormalities.  She reports no breast complaints outside of the mass she palpated.  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  Past Medical History  Diagnosis Date  . Syncope   . Diastolic dysfunction   . Hyperlipidemia   . Episode of dizziness     Mild episodes  . Orthostatic hypotension     Some episodes  . Arthritis     oa  . Stress incontinence, female     wears pads  . AC (acromioclavicular) joint bone spurs     bone spurs in neck  . 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 Diabetes mellitus     niddm; last A1C 6.2  . Skin cancer     Past Surgical History  Procedure Laterality Date  . Other surgical history      Hysterectomy  . Cholecystectomy  1992  . Abdominal hysterectomy  1985    1 ovary removed  . Total hip arthroplasty  02/23/2011    Procedure: TOTAL HIP ARTHROPLASTY;  Surgeon: Gus Rankin Aluisio;  Location: [facility];  Service: Orthopedics;  Laterality: Left;  . Pubovaginal sling  02/03/2012    Procedure: Leonides Grills;  Surgeon: Marcine Matar, MD;  Location: [facility];  Service: Urology;  Laterality: N/A;  1 HR  LYNX SLING    Family History  Problem Relation Age of Onset  . Breast cancer Maternal Aunt   . Breast cancer Maternal Uncle     Social History History  Substance Use Topics  . Smoking  status: Former Smoker -- 0.25 packs/day for 1 years    Quit date: 02/14/1957  . Smokeless tobacco: Never Used  . Alcohol Use: No    Allergies  Allergen Reactions  . Oxycodone Other (See Comments)    hallucinations    Current Outpatient Prescriptions  Medication Sig Dispense Refill  . acetaminophen (TYLENOL) 500 MG tablet Take 1-2 tablets (500-1,000 mg total) by mouth every 6 (six) hours as needed.  80 tablet  0  . Alpha-Lipoic Acid 600 MG CAPS Take 1 each by mouth daily.      . carvedilol (COREG) 6.25 MG tablet Take 1 tablet (6.25 mg total) by mouth 2 (two) times daily with a meal.  180 tablet  3  . ciprofloxacin (CIPRO) 250 MG tablet Take 1 tablet (250 mg total) by mouth 2 (two) times daily.      Marland Kitchen glipiZIDE-metformin (METAGLIP) 2.5-500 MG per tablet Take 1 tablet by mouth every morning.       . lovastatin (MEVACOR) 10 MG tablet Take 10 mg by mouth at bedtime.       . Multiple Vitamin (MULTIVITAMIN) capsule Take 1 capsule by mouth daily.      . traMADol Janean Sark)  50 MG tablet Take 1 tablet (50 mg total) by mouth every 6 (six) hours as needed for pain.  20 tablet  0  . tretinoin (RETIN-A) 0.025 % cream Apply topically at bedtime.       No current facility-administered medications for this visit.   Facility-Administered Medications Ordered in Other Visits  Medication Dose Route Frequency Provider Last Rate Last Dose  . gentamicin (GARAMYCIN) 160 mg in dextrose 5 % 50 mL IVPB  160 mg Intravenous 30 min Pre-Op Marcine Matar, MD        Review of Systems Review of Systems  Constitutional: Negative for fever, chills and unexpected weight change.  HENT: Negative for hearing loss, congestion, sore throat, trouble swallowing and voice change.   Eyes: Negative for visual disturbance.  Respiratory: Negative for cough and wheezing.   Cardiovascular: Negative for chest pain, palpitations and leg swelling.  Gastrointestinal: Negative for nausea, vomiting, abdominal pain, diarrhea,  constipation, blood in stool, abdominal distention and anal bleeding.  Genitourinary: Negative for hematuria, vaginal bleeding and difficulty urinating.  Musculoskeletal: Negative for arthralgias.  Skin: Negative for rash and wound.  Neurological: Negative for seizures, syncope and headaches.  Hematological: Negative for adenopathy. Does not bruise/bleed easily.  Psychiatric/Behavioral: Negative for confusion.    There were no vitals taken for this visit.  Physical Exam Physical Exam  Vitals reviewed. Constitutional: She appears well-developed and well-nourished.  Neck: Neck supple.  Cardiovascular: Normal rate, regular rhythm and normal heart sounds.   Pulmonary/Chest: Effort normal and breath sounds normal. She has no wheezes. She has no rales. Right breast exhibits mass. Right breast exhibits no inverted nipple, no nipple discharge, no skin change and no tenderness. Left breast exhibits no inverted nipple, no mass, no nipple discharge, no skin change and no tenderness.    Lymphadenopathy:    She has no cervical adenopathy.    She has no axillary adenopathy.       Right: No supraclavicular adenopathy present.       Left: No supraclavicular adenopathy present.    Data Reviewed MRI BILATERAL BREASTS WITHOUT AND WITH CONTRAST  Technique: Multiplanar, multisequence MR images of the right breast  were obtained prior to and following the intravenous administration  of 17ml of Multihance.  Labs: Labs were obtained at [facility]. BUN is 12.  Creatinine is 0.7.  Comparison: Recent imaging examinations.  FINDINGS:  Breast composition: b: Scatteredfibroglandular tissue  Background parenchymal enhancement: Mild  Right breast: There is a 1.5 x 1.7 x 1.5 cm irregular enhancement  in the area of recent biopsy proven cancer of the right breast  subareolar 11 o'clock position with plateau type enhancement. The  overlying skin demonstrate no abnormal enhancement.  Left breast: No  mass or abnormal enhancement.  Lymph nodes: No abnormal appearing lymph nodes.  Ancillary findings: None.  Impression:  BI-RADS CATEGORY 6: Known biopsy-proven malignancy - appropriate  action should be taken.   Assessment    Right breast cancer    Plan    Right breast wire guided lumpectomy, right axillary sentinel node biopsy, port placement     She is good candidate for breast conservation but will need chemo/herceptin also. We discussed the staging and pathophysiology of breast cancer. We discussed all of the different options for treatment for breast cancer including surgery, chemotherapy, radiation therapy, Herceptin, and antiestrogen therapy.   We discussed a sentinel lymph node biopsy as she does not appear to having lymph node involvement right now. We discussed the performance of  that with injection of radioactive tracer and blue dye. We discussed that she would have an incision underneath her axillary hairline. We discussed that there is a bout a 10-20% chance of having a positive node with a sentinel lymph node biopsy and we will await the permanent pathology to make any other first further decisions in terms of her treatment. One of these options might be to return to the operating room to perform an axillary lymph node dissection. We discussed about a 1-2% risk lifetime of chronic shoulder pain as well as lymphedema associated with a sentinel lymph node biopsy.  We discussed the options for treatment of the breast cancer which included lumpectomy versus a mastectomy. We discussed the performance of the lumpectomy with a wire placement. We discussed a 10% chance of a positive margin requiring reexcision in the operating room. We also discussed that she may need radiation therapy or antiestrogen therapy or both if she undergoes lumpectomy. We discussed the mastectomy and the postoperative care for that as well. We discussed that there is no difference in her survival whether she  undergoes lumpectomy with radiation therapy or antiestrogen therapy versus a mastectomy. There is a slight difference in the local recurrence rate being 3-5% with lumpectomy and about 1% with a mastectomy. Port placement with risks/benefits was discussed.  I showed her the device and where it would be placed We discussed the risks of operation including bleeding, infection, possible reoperation. She understands her further therapy will be based on what her stages at the time of her operation.   Gertrude Tarbet 09/26/2012, 4:48 PM

## 2012-09-26 NOTE — Progress Notes (Signed)
Radiation Oncology         [phone]) [phone] ________________________________  Initial outpatient Consultation  Name: Angela Rosario MRN: [id number]  Date: 09/26/2012  DOB: [birth year]  NW:GNFAOZH,YQMVHQI W, MD  Emelia Loron, MD   REFERRING PHYSICIAN: Emelia Loron, MD  DIAGNOSIS: Clinical T1 C. 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 female who presented with palpable right breast mass, approximately 6 months after her previous mammogram.  She underwent subsequent mammography on 7/29 which showed corresponding abnormality to the area of palpation.  US showed a 7 mm mass at the 12:00 position, this is a superficial mass. MRI of her breasts was performed, demonstrating a 1.5 x 1.7 x 1.5 cm mass in the right breast with no signs of skin involvement. She is status post biopsy revealing the pathology as described above. She is otherwise in her usual state of health.   PREVIOUS RADIATION THERAPY: No  PAST MEDICAL HISTORY:  has a past medical history of Syncope; Diastolic dysfunction; Hyperlipidemia; Episode of dizziness; Orthostatic hypotension; Arthritis; Stress incontinence, female; AC (acromioclavicular) joint bone spurs; Retinitis pigmentosa; and Diabetes mellitus.    PAST SURGICAL HISTORY: Past Surgical History  Procedure Laterality Date  . Other surgical history      Hysterectomy  . Cholecystectomy  1992  . Abdominal hysterectomy  1985    1 ovary removed  . Total hip arthroplasty  02/23/2011    Procedure: TOTAL HIP ARTHROPLASTY;  Surgeon: Gus Rankin Aluisio;  Location: [facility];  Service: Orthopedics;  Laterality: Left;  . Pubovaginal sling  02/03/2012    Procedure: Leonides Grills;  Surgeon: Marcine Matar, MD;  Location: [facility];  Service: Urology;  Laterality: N/A;  1 HR  LYNX SLING    FAMILY HISTORY: family history is not on file.  SOCIAL  HISTORY:  reports that she quit smoking about 55 years ago. She has never used smokeless tobacco. She reports that she does not drink alcohol or use illicit drugs.  ALLERGIES: Oxycodone  MEDICATIONS:  Current Outpatient Prescriptions  Medication Sig Dispense Refill  . acetaminophen (TYLENOL) 500 MG tablet Take 1-2 tablets (500-1,000 mg total) by mouth every 6 (six) hours as needed.  80 tablet  0  . carvedilol (COREG) 6.25 MG tablet Take 1 tablet (6.25 mg total) by mouth 2 (two) times daily with a meal.  180 tablet  3  . ciprofloxacin (CIPRO) 250 MG tablet Take 1 tablet (250 mg total) by mouth 2 (two) times daily.      Marland Kitchen glipiZIDE-metformin (METAGLIP) 2.5-500 MG per tablet Take 1 tablet by mouth every morning.       . lovastatin (MEVACOR) 10 MG tablet Take 10 mg by mouth at bedtime.       . traMADol (ULTRAM) 50 MG tablet Take 1 tablet (50 mg total) by mouth every 6 (six) hours as needed for pain.  20 tablet  0   No current facility-administered medications for this encounter.   Facility-Administered Medications Ordered in Other Encounters  Medication Dose Route Frequency Provider Last Rate Last Dose  . gentamicin (GARAMYCIN) 160 mg in dextrose 5 % 50 mL IVPB  160 mg Intravenous 30 min Pre-Op Marcine Matar, MD        REVIEW OF SYSTEMS:  Pertinent items are noted in HPI.   PHYSICAL EXAM:  Vitals with Age-Percentiles 09/26/2012  Length 167.6 cm  Systolic 154  Diastolic 80  Pulse 69  Respiration 18  Weight 86.728 kg  VISIT REPORT    General: Alert and oriented, in no acute distress HEENT: Head is normocephalic. Extraocular movements intact  Neck: Neck is supple, no palpable cervical or supraclavicular lymphadenopathy. She does have a horizontal scar on the low right neck which she reports is from Mohs surgery for skin cancer Heart: Regular in rate and rhythm with no murmurs, rubs, or gallops. Chest: Clear to auscultation bilaterally, with no rhonchi, wheezes, or rales. Abdomen:  Soft, nontender, nondistended, with no rigidity or guarding. Extremities: No cyanosis or edema. Lymphatics: No concerning lymphadenopathy. Musculoskeletal: symmetric strength and muscle tone throughout. Neurologic: Cranial nerves II through XII are grossly intact. No obvious focalities. Speech is fluent. Coordination is intact. Psychiatric: Judgment and insight are intact. Affect is appropriate. Breasts: Approximately 2 cm mass in 11:30 o'clock position close to her nipple. This is in the right breast. Otherwise no palpable abnormalities in either breasts or axillary regions   LABORATORY DATA:  Lab Results  Component Value Date   WBC 8.5 09/26/2012   HGB 13.8 09/26/2012   HCT 39.9 09/26/2012   MCV 91.4 09/26/2012   PLT 271 09/26/2012   CMP     Component Value Date/Time   NA 144 09/26/2012 1219   NA 142 02/03/2012 1114   K 3.9 09/26/2012 1219   K 4.1 02/03/2012 1114   CL 104 02/26/2011 0530   CO2 26 09/26/2012 1219   CO2 25 02/26/2011 0530   GLUCOSE 118 09/26/2012 1219   GLUCOSE 136* 02/03/2012 1114   BUN 10.7 09/26/2012 1219   BUN 10 02/26/2011 0530   CREATININE 0.8 09/26/2012 1219   CREATININE 0.58 02/26/2011 0530   CALCIUM 9.7 09/26/2012 1219   CALCIUM 8.6 02/26/2011 0530   PROT 7.0 09/26/2012 1219   PROT 7.3 02/17/2011 1045   ALBUMIN 4.1 09/26/2012 1219   ALBUMIN 4.3 02/17/2011 1045   AST 11 09/26/2012 1219   AST 26 02/17/2011 1045   ALT 11 09/26/2012 1219   ALT 36* 02/17/2011 1045   ALKPHOS 85 09/26/2012 1219   ALKPHOS 77 02/17/2011 1045   BILITOT 0.56 09/26/2012 1219   BILITOT 0.5 02/17/2011 1045   GFRNONAA >90 02/26/2011 0530   GFRAA >90 02/26/2011 0530         RADIOGRAPHY: Mr Breast Bilateral W Wo Contrast  09/19/2012   *RADIOLOGY REPORT*  Clinical Data:  Recent diagnosis of right breast cancer.  MRI BILATERAL BREASTS WITHOUT AND WITH CONTRAST  Technique: Multiplanar, multisequence MR images of the right breast were obtained prior to and following the intravenous administration of 17ml of  Multihance.  Labs:  Labs were obtained at [facility].  BUN is 12. Creatinine is 0.7.  Comparison:  Recent imaging examinations.  FINDINGS:  Breast composition:  b: Scatteredfibroglandular tissue  Background parenchymal enhancement:  Mild  Right breast:  There is a 1.5 x 1.7 x 1.5 cm irregular enhancement in the area of recent biopsy proven cancer of the right breast subareolar 11 o'clock position with plateau type enhancement. The overlying skin demonstrate no abnormal enhancement.  Left breast:  No mass or abnormal enhancement.  Lymph nodes:  No abnormal appearing lymph nodes.  Ancillary findings:  None.  Impression:  BI-RADS CATEGORY 6:  Known biopsy-proven malignancy - appropriate action should be taken.  Recommendation:  Treatment plan.  THREE-DIMENSIONAL MR IMAGE RENDERING ON INDEPENDENT WORKSTATION:  Three-dimensional MR images were rendered by post-processing of the original MR data on a DynaCad workstation.  The three-dimensional MR  images were interpreted, and findings were reported in the accompanying complete MRI report for this study.   Original Report Authenticated By: Sherian Rein, M.D.      IMPRESSION/PLAN: This is a very pleasant 73 year old woman with triple positive clinical T1 CN 0 M0 right breast cancer.  She has been discussed at our multidisciplinary tumor board.  The consensus is that she would be a good candidate for breast conservation. I talked to her about the option of a mastectomy and informed her that her expected overall survival would be equivalent between mastectomy and breast conservation, based upon randomized controlled data. She is enthusiastic about breast conservation.  She has tentative plans to pursue a lumpectomy and sentinel lymph node biopsy followed by chemotherapy. Radiation would then follow  It was a pleasure meeting the patient today. We discussed the risks, benefits, and side effects of radiotherapy. We discussed that radiation would take  approximately 4-6 weeks to complete.  We spoke about acute effects including skin irritation and fatigue as well as much less common late effects including lung irritation. We spoke about the latest technology that is used to minimize the risk of late effects for breast cancer patients undergoing radiotherapy. No guarantees of treatment were given. The patient is enthusiastic about proceeding with treatment. I look forward to participating in the patient's care.  She requests to see Dr. Michell Heinrich if I am on maternity leave when she is ready for radiotherapy.   I spent 25 minutes  face to face with the patient and more than 50% of that time was spent in counseling and/or coordination of care.    __________________________________________   Lonie Peak, MD

## 2012-09-26 NOTE — Progress Notes (Signed)
LAVAUGHN BISIG [id number] Jul 05, 1940 72 y.o. 09/26/2012 9:22 PM  CC  [name], MD [address] [place], [place]. [place] [address] Dr. Emelia Loron Dr. Lonie Peak REASON FOR CONSULTATION:  72 year old female with new diagnosis of stage I invasive ductal carcinoma. Patient is seen in the multidisciplinary breast clinic for discussion of treatment options.  STAGE:   Cancer of central portion of female breast   Primary site: Breast (Right)   Staging method: AJCC 7th Edition   Clinical: Stage IA (T1c, N0, cM0)   Summary: Stage IA (T1c, N0, cM0)  REFERRING PHYSICIAN: Dr. Emelia Loron  HISTORY OF PRESENT ILLNESS:  EMILYGRACE GROTHE is a 72 y.o. female.  Would medical history significant for cardiac disease hyper tension and diabetes. Patient palpated a mass he weeks ago. She went on to have a mammogram performed that showed a 7 mm mass at the 12:00 position. The ultrasound showed the mass to be 7 mm) nipple. MRI showed a 1.7 cm irregular mass. Patient had a biopsy performed that showed invasive ductal carcinoma intermediate grade tumor was ER positive PR positive HER-2/neu positive with amplification of 2.96. Ki-67 was elevated at 45%. Patient's case was discussed at the multidisciplinary breast clinic today for treatment options. Her pathology and radiology and discussion ensued at the multidisciplinary breast conference. She herself is without any complaints. She was also seen by Dr. Emelia Loron and Dr. Lonie Peak.   Past Medical History: Past Medical History  Diagnosis Date  . Syncope   . Diastolic dysfunction   . Hyperlipidemia   . Episode of dizziness     Mild episodes  . Orthostatic hypotension     Some episodes  . Arthritis     oa  . Stress incontinence, female     wears pads  . AC (acromioclavicular) joint bone spurs     bone spurs in neck  . 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 Diabetes  mellitus     niddm; last A1C 6.2  . Skin cancer     Past Surgical History: Past Surgical History  Procedure Laterality Date  . Other surgical history      Hysterectomy  . Cholecystectomy  1992  . Abdominal hysterectomy  1985    1 ovary removed  . Total hip arthroplasty  02/23/2011    Procedure: TOTAL HIP ARTHROPLASTY;  Surgeon: Gus Rankin Aluisio;  Location: [facility];  Service: Orthopedics;  Laterality: Left;  . Pubovaginal sling  02/03/2012    Procedure: Leonides Grills;  Surgeon: Marcine Matar, MD;  Location: [facility];  Service: Urology;  Laterality: N/A;  1 HR  LYNX SLING    Family History: Family History  Problem Relation Age of Onset  . Breast cancer Maternal Aunt   . Breast cancer Maternal Uncle     Social History History  Substance Use Topics  . Smoking status: Former Smoker -- 0.25 packs/day for 1 years    Quit date: 02/14/1957  . Smokeless tobacco: Never Used  . Alcohol Use: No    Allergies: Allergies  Allergen Reactions  . Oxycodone Other (See Comments)    hallucinations    Current Medications: Current Outpatient Prescriptions  Medication Sig Dispense Refill  . Alpha-Lipoic Acid 600 MG CAPS Take 1 each by mouth daily.      . Multiple Vitamin (MULTIVITAMIN) capsule Take 1 capsule by mouth daily.      Marland Kitchen tretinoin (RETIN-A) 0.025 % cream Apply topically  at bedtime.      Marland Kitchen acetaminophen (TYLENOL) 500 MG tablet Take 1-2 tablets (500-1,000 mg total) by mouth every 6 (six) hours as needed.  80 tablet  0  . carvedilol (COREG) 6.25 MG tablet Take 1 tablet (6.25 mg total) by mouth 2 (two) times daily with a meal.  180 tablet  3  . ciprofloxacin (CIPRO) 250 MG tablet Take 1 tablet (250 mg total) by mouth 2 (two) times daily.      Marland Kitchen glipiZIDE-metformin (METAGLIP) 2.5-500 MG per tablet Take 1 tablet by mouth every morning.       . lovastatin (MEVACOR) 10 MG tablet Take 10 mg by mouth at bedtime.       . traMADol (ULTRAM) 50 MG tablet Take 1 tablet  (50 mg total) by mouth every 6 (six) hours as needed for pain.  20 tablet  0   No current facility-administered medications for this visit.   Facility-Administered Medications Ordered in Other Visits  Medication Dose Route Frequency Provider Last Rate Last Dose  . gentamicin (GARAMYCIN) 160 mg in dextrose 5 % 50 mL IVPB  160 mg Intravenous 30 min Pre-Op Marcine Matar, MD        OB/GYN History:patient had menarche at age 60 she underwent menopause in 1985 she had been briefly on hormone replacement therapy. She's had to live births first live birth at 1.  Fertility Discussion:not applicable Prior History of Cancer:no  Health Maintenance:  Colonoscopyyes Bone Densitygas 2012 Last PAP smearunknown  ECOG PERFORMANCE STATUS: 0 - Asymptomatic  Genetic Counseling/testing: no  REVIEW OF SYSTEMS: Review of Systems  Constitutional: Negative.   HENT: Positive for tinnitus. Negative for ear pain, nosebleeds, congestion, sore throat and ear discharge.   Eyes: Negative.   Respiratory: Negative.  Negative for stridor.   Cardiovascular: Negative.   Gastrointestinal: Negative.   Genitourinary: Negative.   Musculoskeletal: Negative.   Skin: Negative.   Neurological: Negative.   Endo/Heme/Allergies: Negative.   Psychiatric/Behavioral: Negative.      PHYSICAL EXAMINATION: Blood pressure 154/80, pulse 69, temperature 97.7 F (36.5 C), temperature source Oral, resp. rate 18, height 5\' 6"  (1.676 m), weight 191 lb 3.2 oz (86.728 kg), SpO2 100.00%.  Well-developed nourished female in no acute distress HEENT exam EOMI PERRLA sclerae anicteric no conjunctival pallor oral mucosa is moist neck is supple Lungs clear to auscultation  Cardiovascular regular rate rhythm Abdomen soft nontender no HSM Extremities no edema Neuro patient's alert oriented otherwise nonfocal Breast exam reveals a palpable right breast mass when area of ecchymosis  STUDIES/RESULTS: Mr Breast Bilateral W Wo  Contrast  09/19/2012   *RADIOLOGY REPORT*  Clinical Data:  Recent diagnosis of right breast cancer.  MRI BILATERAL BREASTS WITHOUT AND WITH CONTRAST  Technique: Multiplanar, multisequence MR images of the right breast were obtained prior to and following the intravenous administration of 17ml of Multihance.  Labs:  Labs were obtained at [facility].  BUN is 12. Creatinine is 0.7.  Comparison:  Recent imaging examinations.  FINDINGS:  Breast composition:  b: Scatteredfibroglandular tissue  Background parenchymal enhancement:  Mild  Right breast:  There is a 1.5 x 1.7 x 1.5 cm irregular enhancement in the area of recent biopsy proven cancer of the right breast subareolar 11 o'clock position with plateau type enhancement. The overlying skin demonstrate no abnormal enhancement.  Left breast:  No mass or abnormal enhancement.  Lymph nodes:  No abnormal appearing lymph nodes.  Ancillary findings:  None.  Impression:  BI-RADS CATEGORY 6:  Known biopsy-proven malignancy - appropriate action should be taken.  Recommendation:  Treatment plan.  THREE-DIMENSIONAL MR IMAGE RENDERING ON INDEPENDENT WORKSTATION:  Three-dimensional MR images were rendered by post-processing of the original MR data on a DynaCad workstation.  The three-dimensional MR images were interpreted, and findings were reported in the accompanying complete MRI report for this study.   Original Report Authenticated By: Sherian Rein, M.D.     LABS:    Chemistry      Component Value Date/Time   NA 144 09/26/2012 1219   NA 142 02/03/2012 1114   K 3.9 09/26/2012 1219   K 4.1 02/03/2012 1114   CL 104 02/26/2011 0530   CO2 26 09/26/2012 1219   CO2 25 02/26/2011 0530   BUN 10.7 09/26/2012 1219   BUN 10 02/26/2011 0530   CREATININE 0.8 09/26/2012 1219   CREATININE 0.58 02/26/2011 0530      Component Value Date/Time   CALCIUM 9.7 09/26/2012 1219   CALCIUM 8.6 02/26/2011 0530   ALKPHOS 85 09/26/2012 1219   ALKPHOS 77 02/17/2011 1045   AST 11 09/26/2012  1219   AST 26 02/17/2011 1045   ALT 11 09/26/2012 1219   ALT 36* 02/17/2011 1045   BILITOT 0.56 09/26/2012 1219   BILITOT 0.5 02/17/2011 1045      Lab Results  Component Value Date   WBC 8.5 09/26/2012   HGB 13.8 09/26/2012   HCT 39.9 09/26/2012   MCV 91.4 09/26/2012   PLT 271 09/26/2012   PATHOLOGY: ADDITIONAL INFORMATION: PROGNOSTIC INDICATORS - ACIS Results: IMMUNOHISTOCHEMICAL AND MORPHOMETRIC ANALYSIS BY THE AUTOMATED CELLULAR IMAGING SYSTEM (ACIS) Estrogen Receptor: 100%, POSITIVE, STRONG STAINING INTENSITY Progesterone Receptor: 100%, POSITIVE, STRONG STAINING INTENSITY Proliferation Marker Ki67: 45% REFERENCE RANGE ESTROGEN RECEPTOR NEGATIVE <1% POSITIVE =>1% PROGESTERONE RECEPTOR NEGATIVE <1% POSITIVE =>1% All controls stained appropriately Pecola Leisure MD Pathologist, Electronic Signature ( Signed 09/19/2012) CHROMOGENIC IN-SITU HYBRIDIZATION Results: HER2/NEU BY CISH - SHOWS AMPLIFICATION BY CISH ANALYSIS. RESULT RATIO OF HER2: CEP 17 SIGNALS 2.96 AVERAGE HER2 COPY NUMBER PER CELL 3.55 REFERENCE RANGE 1 of 3 Duplicate copy FINAL for MALIYA, MARICH [phone]) ADDITIONAL INFORMATION:(continued) NEGATIVE HER2/Chr17 Ratio <2.0 and Average HER2 copy number <4.0 EQUIVOCAL HER2/Chr17 Ratio <2.0 and Average HER2 copy number 4.0 and <6.0 POSITIVE HER2/Chr17 Ratio >=2.0 and/or Average HER2 copy number >=6.0 Pecola Leisure MD Pathologist, Electronic Signature ( Signed 09/18/2012) FINAL DIAGNOSIS Diagnosis Breast, right, needle core biopsy, 12:00 subareolar - INVASIVE MAMMARY CARCINOMA. - SEE COMMENT. Microscopic Comment The carcinoma appears Grade II and has some lobular features. A breast prognostic profile will be performed and the results reported separately. The results were called to [facility] on 09/13/12. (JBK:caf 09/13/12) Pecola Leisure MD Pathologist, Electronic Signature (Case signed 09/13/2012) Specimen Gross and Cl ASSESSMENT    72 year old  female with  #1 palpable right breast mass at the 12:00 position measuring 7 mm ultrasound confirmed this mass close to the nipple. MRI revealed the mass to be irregular and measuring 1.7 cm not involving the skin. Biopsy revealed invasive ductal carcinoma intermediate grade ER positive PR positive HER-2/neu positive with a Ki-67 of 45%. Patient's case was discussed at the [facility] conference. She is seen in the [facility] clinic for discussion of treatment options  #2 patient and I discussed her diagnosis treatment options her radiology. She does want a PET scan to look for any disease elsewhere I think this seems reasonable.  #3 we discussed treatment options including lumpectomy with sentinel lymph node biopsy.  #4 because patient's  tumor is HER-2/neu positive she is a candidate for Her2 therapy would discussed treatment with Herceptin Taxotere carboplatinum. We discussed the rationale for this.  Clinical Trial Eligibility:no Multidisciplinary conference discussion yes     PLAN:    #1 patient will proceed with staging scans including getting PET scan. We will also need an echocardiogram and cardiology consultation.  #2 she'll proceed with a lumpectomy and sentinel lymph node biopsy. She will also need a Port-A-Cath placed.  #3 patient will begin Taxotere carboplatinum and Herceptin starting October of 6 2014. She does not want to start sooner so she hasn't March show coming up. She has this aren't sure once a year appear        Discussion: Patient is being treated per NCCN breast cancer care guidelines appropriate for stage.I   Thank you so much for allowing me to participate in the care of Cyndy Freeze. I will continue to follow up the patient with you and assist in her care.  All questions were answered. The patient knows to call the clinic with any problems, questions or concerns. We can certainly see the patient much sooner if necessary.  I spent 55 minutes counseling the patient  face to face. The total time spent in the appointment was 60 minutes.  Drue Second, MD Medical/Oncology [facility] [phone] (beeper) [phone] (Office)  09/26/2012, 9:22 PM

## 2012-09-26 NOTE — Progress Notes (Signed)
Checked in new pt with no financial concerns. °

## 2012-09-27 ENCOUNTER — Telehealth (INDEPENDENT_AMBULATORY_CARE_PROVIDER_SITE_OTHER): Payer: Self-pay

## 2012-09-27 NOTE — Telephone Encounter (Signed)
LMOM for pt to call me. I just want to introduce myself to her since she was seen at the multidiscinplinary clinic and is going to be having surgery by Dr Dwain Sarna.

## 2012-09-28 ENCOUNTER — Telehealth: Payer: Self-pay | Admitting: *Deleted

## 2012-09-28 ENCOUNTER — Encounter (HOSPITAL_BASED_OUTPATIENT_CLINIC_OR_DEPARTMENT_OTHER): Payer: Self-pay | Admitting: *Deleted

## 2012-09-28 ENCOUNTER — Telehealth: Payer: Self-pay | Admitting: Oncology

## 2012-09-28 NOTE — Telephone Encounter (Signed)
Pt returned my call. The pt was notified that our schedulers have her surgical orders and would be calling her with the surgery info. The pt understands.

## 2012-09-28 NOTE — Progress Notes (Signed)
Pt sees dr Melburn Popper for hx tachy hr-never had to have echo or cath-no resp problems Labs done cc-09/26/12=ekg 12/13 when she had surgery

## 2012-09-28 NOTE — Telephone Encounter (Signed)
Per staff message and POF I have scheduled appts.  JMW  

## 2012-10-01 ENCOUNTER — Telehealth: Payer: Self-pay | Admitting: *Deleted

## 2012-10-01 NOTE — Telephone Encounter (Signed)
Called and spoke with patient from [facility] 09/26/12. No questions or concerns at this time.

## 2012-10-02 ENCOUNTER — Other Ambulatory Visit: Payer: Self-pay | Admitting: Certified Registered Nurse Anesthetist

## 2012-10-02 ENCOUNTER — Ambulatory Visit (HOSPITAL_COMMUNITY)
Admission: RE | Admit: 2012-10-02 | Discharge: 2012-10-02 | Disposition: A | Discharge status: Home / Self Care | Payer: Medicare Other | Source: Ambulatory Visit | Attending: Oncology | Admitting: Oncology

## 2012-10-02 ENCOUNTER — Encounter (HOSPITAL_COMMUNITY): Payer: Self-pay

## 2012-10-02 ENCOUNTER — Encounter (HOSPITAL_COMMUNITY)
Admission: RE | Admit: 2012-10-02 | Discharge: 2012-10-02 | Disposition: A | Discharge status: Home / Self Care | Payer: Medicare Other | Source: Ambulatory Visit | Attending: Oncology | Admitting: Oncology

## 2012-10-02 DIAGNOSIS — K5730 Diverticulosis of large intestine without perforation or abscess without bleeding: Secondary | ICD-10-CM | POA: Insufficient documentation

## 2012-10-02 DIAGNOSIS — E119 Type 2 diabetes mellitus without complications: Secondary | ICD-10-CM | POA: Insufficient documentation

## 2012-10-02 DIAGNOSIS — C50111 Malignant neoplasm of central portion of right female breast: Secondary | ICD-10-CM

## 2012-10-02 DIAGNOSIS — C50919 Malignant neoplasm of unspecified site of unspecified female breast: Secondary | ICD-10-CM | POA: Insufficient documentation

## 2012-10-02 DIAGNOSIS — Z9089 Acquired absence of other organs: Secondary | ICD-10-CM | POA: Insufficient documentation

## 2012-10-02 DIAGNOSIS — Z01818 Encounter for other preprocedural examination: Secondary | ICD-10-CM | POA: Insufficient documentation

## 2012-10-02 DIAGNOSIS — I517 Cardiomegaly: Secondary | ICD-10-CM

## 2012-10-02 DIAGNOSIS — I2510 Atherosclerotic heart disease of native coronary artery without angina pectoris: Secondary | ICD-10-CM | POA: Insufficient documentation

## 2012-10-02 DIAGNOSIS — N289 Disorder of kidney and ureter, unspecified: Secondary | ICD-10-CM | POA: Insufficient documentation

## 2012-10-02 MED ORDER — FLUDEOXYGLUCOSE F - 18 (FDG) INJECTION
16.8000 | Freq: Once | INTRAVENOUS | Status: AC | PRN
  Administered 2012-10-02: 16.8 via INTRAVENOUS

## 2012-10-02 NOTE — Progress Notes (Signed)
Echocardiogram 2D Echocardiogram has been performed.  Angela Rosario 10/02/2012, 3:42 PM 

## 2012-10-04 ENCOUNTER — Encounter: Payer: Self-pay | Admitting: Specialist

## 2012-10-04 ENCOUNTER — Encounter (HOSPITAL_BASED_OUTPATIENT_CLINIC_OR_DEPARTMENT_OTHER): Payer: Self-pay

## 2012-10-04 ENCOUNTER — Ambulatory Visit (HOSPITAL_BASED_OUTPATIENT_CLINIC_OR_DEPARTMENT_OTHER)
Admission: RE | Admit: 2012-10-04 | Discharge: 2012-10-04 | Disposition: A | Discharge status: Home / Self Care | Payer: Medicare Other | Source: Ambulatory Visit | Attending: General Surgery | Admitting: General Surgery

## 2012-10-04 ENCOUNTER — Ambulatory Visit (HOSPITAL_BASED_OUTPATIENT_CLINIC_OR_DEPARTMENT_OTHER): Payer: Medicare Other | Admitting: Anesthesiology

## 2012-10-04 ENCOUNTER — Encounter (HOSPITAL_BASED_OUTPATIENT_CLINIC_OR_DEPARTMENT_OTHER): Payer: Self-pay | Admitting: Anesthesiology

## 2012-10-04 ENCOUNTER — Ambulatory Visit (HOSPITAL_COMMUNITY): Payer: Medicare Other

## 2012-10-04 ENCOUNTER — Encounter (HOSPITAL_COMMUNITY)
Admission: RE | Admit: 2012-10-04 | Discharge: 2012-10-04 | Disposition: A | Discharge status: Home / Self Care | Payer: Medicare Other | Source: Ambulatory Visit | Attending: General Surgery | Admitting: General Surgery

## 2012-10-04 ENCOUNTER — Encounter (HOSPITAL_BASED_OUTPATIENT_CLINIC_OR_DEPARTMENT_OTHER)
Admission: RE | Disposition: A | Discharge status: Home / Self Care | Payer: Self-pay | Source: Ambulatory Visit | Attending: General Surgery

## 2012-10-04 DIAGNOSIS — C50111 Malignant neoplasm of central portion of right female breast: Secondary | ICD-10-CM

## 2012-10-04 DIAGNOSIS — Z87891 Personal history of nicotine dependence: Secondary | ICD-10-CM | POA: Insufficient documentation

## 2012-10-04 DIAGNOSIS — I519 Heart disease, unspecified: Secondary | ICD-10-CM | POA: Insufficient documentation

## 2012-10-04 DIAGNOSIS — I4949 Other premature depolarization: Secondary | ICD-10-CM | POA: Insufficient documentation

## 2012-10-04 DIAGNOSIS — D0590 Unspecified type of carcinoma in situ of unspecified breast: Secondary | ICD-10-CM | POA: Insufficient documentation

## 2012-10-04 DIAGNOSIS — C50919 Malignant neoplasm of unspecified site of unspecified female breast: Secondary | ICD-10-CM | POA: Insufficient documentation

## 2012-10-04 DIAGNOSIS — M1990 Unspecified osteoarthritis, unspecified site: Secondary | ICD-10-CM | POA: Insufficient documentation

## 2012-10-04 DIAGNOSIS — M5380 Other specified dorsopathies, site unspecified: Secondary | ICD-10-CM | POA: Insufficient documentation

## 2012-10-04 DIAGNOSIS — Z885 Allergy status to narcotic agent status: Secondary | ICD-10-CM | POA: Insufficient documentation

## 2012-10-04 DIAGNOSIS — Z85828 Personal history of other malignant neoplasm of skin: Secondary | ICD-10-CM | POA: Insufficient documentation

## 2012-10-04 DIAGNOSIS — I951 Orthostatic hypotension: Secondary | ICD-10-CM | POA: Insufficient documentation

## 2012-10-04 DIAGNOSIS — E119 Type 2 diabetes mellitus without complications: Secondary | ICD-10-CM | POA: Insufficient documentation

## 2012-10-04 DIAGNOSIS — N393 Stress incontinence (female) (male): Secondary | ICD-10-CM | POA: Insufficient documentation

## 2012-10-04 DIAGNOSIS — Z170 Estrogen receptor positive status [ER+]: Secondary | ICD-10-CM | POA: Insufficient documentation

## 2012-10-04 DIAGNOSIS — M898X9 Other specified disorders of bone, unspecified site: Secondary | ICD-10-CM | POA: Insufficient documentation

## 2012-10-04 DIAGNOSIS — Z79899 Other long term (current) drug therapy: Secondary | ICD-10-CM | POA: Insufficient documentation

## 2012-10-04 DIAGNOSIS — H3552 Pigmentary retinal dystrophy: Secondary | ICD-10-CM | POA: Insufficient documentation

## 2012-10-04 DIAGNOSIS — E785 Hyperlipidemia, unspecified: Secondary | ICD-10-CM | POA: Insufficient documentation

## 2012-10-04 HISTORY — DX: Essential (primary) hypertension: I10

## 2012-10-04 HISTORY — DX: Presence of spectacles and contact lenses: Z97.3

## 2012-10-04 HISTORY — PX: BREAST LUMPECTOMY WITH NEEDLE LOCALIZATION AND AXILLARY SENTINEL LYMPH NODE BX: SHX5760

## 2012-10-04 HISTORY — PX: PORTACATH PLACEMENT: SHX2246

## 2012-10-04 LAB — GLUCOSE, CAPILLARY: Glucose-Capillary: 123 mg/dL — ABNORMAL HIGH (ref 70–99)

## 2012-10-04 SURGERY — BREAST LUMPECTOMY WITH NEEDLE LOCALIZATION AND AXILLARY SENTINEL LYMPH NODE BX
Anesthesia: General | Site: Chest | Laterality: Right | Wound class: Clean

## 2012-10-04 MED ORDER — FENTANYL CITRATE 0.05 MG/ML IJ SOLN
INTRAMUSCULAR | Status: DC | PRN
  Administered 2012-10-04 (×2): 25 ug via INTRAVENOUS
  Administered 2012-10-04: 50 ug via INTRAVENOUS

## 2012-10-04 MED ORDER — PROPOFOL 10 MG/ML IV BOLUS
INTRAVENOUS | Status: DC | PRN
  Administered 2012-10-04: 200 mg via INTRAVENOUS

## 2012-10-04 MED ORDER — HYDROCODONE-ACETAMINOPHEN 5-325 MG PO TABS: 1.0000 | ORAL_TABLET | Freq: Once | ORAL | Status: DC | PRN

## 2012-10-04 MED ORDER — PROMETHAZINE HCL 25 MG/ML IJ SOLN: 6.2500 mg | INTRAMUSCULAR | Status: DC | PRN

## 2012-10-04 MED ORDER — ONDANSETRON HCL 4 MG/2ML IJ SOLN
INTRAMUSCULAR | Status: DC | PRN
  Administered 2012-10-04: 4 mg via INTRAVENOUS

## 2012-10-04 MED ORDER — HYDROMORPHONE HCL PF 1 MG/ML IJ SOLN
0.2500 mg | INTRAMUSCULAR | Status: DC | PRN
  Administered 2012-10-04: 0.5 mg via INTRAVENOUS

## 2012-10-04 MED ORDER — DEXAMETHASONE SODIUM PHOSPHATE 4 MG/ML IJ SOLN
INTRAMUSCULAR | Status: DC | PRN
  Administered 2012-10-04: 10 mg via INTRAVENOUS

## 2012-10-04 MED ORDER — MIDAZOLAM HCL 2 MG/2ML IJ SOLN
1.0000 mg | INTRAMUSCULAR | Status: DC | PRN
  Administered 2012-10-04: 2 mg via INTRAVENOUS

## 2012-10-04 MED ORDER — CEFAZOLIN SODIUM-DEXTROSE 2-3 GM-% IV SOLR: 2.0000 g | INTRAVENOUS | Status: DC

## 2012-10-04 MED ORDER — HEPARIN SOD (PORK) LOCK FLUSH 100 UNIT/ML IV SOLN
INTRAVENOUS | Status: DC | PRN
  Administered 2012-10-04: 500 [IU] via INTRAVENOUS

## 2012-10-04 MED ORDER — LACTATED RINGERS IV SOLN
INTRAVENOUS | Status: DC
  Administered 2012-10-04 (×2): via INTRAVENOUS

## 2012-10-04 MED ORDER — HYDROCODONE-ACETAMINOPHEN 10-325 MG PO TABS: 1.0000 | ORAL_TABLET | Freq: Four times a day (QID) | ORAL | Status: DC | PRN

## 2012-10-04 MED ORDER — LIDOCAINE HCL (CARDIAC) 20 MG/ML IV SOLN
INTRAVENOUS | Status: DC | PRN
  Administered 2012-10-04: 100 mg via INTRAVENOUS

## 2012-10-04 MED ORDER — HEPARIN (PORCINE) IN NACL 2-0.9 UNIT/ML-% IJ SOLN
INTRAMUSCULAR | Status: DC | PRN
  Administered 2012-10-04: 500 mL via INTRAVENOUS

## 2012-10-04 MED ORDER — FENTANYL CITRATE 0.05 MG/ML IJ SOLN
50.0000 ug | INTRAMUSCULAR | Status: DC | PRN
  Administered 2012-10-04: 100 ug via INTRAVENOUS

## 2012-10-04 MED ORDER — MIDAZOLAM HCL 2 MG/2ML IJ SOLN: 1.0000 mg | INTRAMUSCULAR | Status: DC | PRN

## 2012-10-04 MED ORDER — FENTANYL CITRATE 0.05 MG/ML IJ SOLN: 50.0000 ug | Freq: Once | INTRAMUSCULAR | Status: DC

## 2012-10-04 MED ORDER — TECHNETIUM TC 99M SULFUR COLLOID FILTERED
1.0000 | Freq: Once | INTRAVENOUS | Status: AC | PRN
  Administered 2012-10-04: 1 via INTRADERMAL

## 2012-10-04 MED ORDER — BUPIVACAINE HCL (PF) 0.25 % IJ SOLN
INTRAMUSCULAR | Status: DC | PRN
  Administered 2012-10-04: 10 mL
  Administered 2012-10-04: 20 mL

## 2012-10-04 SURGICAL SUPPLY — 80 items
ADH SKN CLS APL DERMABOND .7 (GAUZE/BANDAGES/DRESSINGS) ×2
APL SKNCLS STERI-STRIP NONHPOA (GAUZE/BANDAGES/DRESSINGS) ×2
APPLIER CLIP 9.375 MED OPEN (MISCELLANEOUS) ×3
APR CLP MED 9.3 20 MLT OPN (MISCELLANEOUS) ×2
BAG DECANTER FOR FLEXI CONT (MISCELLANEOUS) ×3 IMPLANT
BENZOIN TINCTURE PRP APPL 2/3 (GAUZE/BANDAGES/DRESSINGS) ×3 IMPLANT
BINDER BREAST LRG (GAUZE/BANDAGES/DRESSINGS) IMPLANT
BINDER BREAST MEDIUM (GAUZE/BANDAGES/DRESSINGS) IMPLANT
BINDER BREAST XLRG (GAUZE/BANDAGES/DRESSINGS) ×1 IMPLANT
BINDER BREAST XXLRG (GAUZE/BANDAGES/DRESSINGS) IMPLANT
BLADE SURG 11 STRL SS (BLADE) ×4 IMPLANT
BLADE SURG 15 STRL LF DISP TIS (BLADE) ×2 IMPLANT
BLADE SURG 15 STRL SS (BLADE) ×6
BNDG COHESIVE 4X5 TAN STRL (GAUZE/BANDAGES/DRESSINGS) IMPLANT
CANISTER SUCTION 1200CC (MISCELLANEOUS) ×3 IMPLANT
CHLORAPREP W/TINT 26ML (MISCELLANEOUS) ×4 IMPLANT
CLIP APPLIE 9.375 MED OPEN (MISCELLANEOUS) ×2 IMPLANT
CLOTH BEACON ORANGE TIMEOUT ST (SAFETY) ×3 IMPLANT
COVER MAYO STAND STRL (DRAPES) ×4 IMPLANT
COVER PROBE 5X48 (MISCELLANEOUS) ×3
COVER PROBE W GEL 5X96 (DRAPES) ×3 IMPLANT
COVER SURGICAL LIGHT HANDLE (MISCELLANEOUS) ×1 IMPLANT
COVER TABLE BACK 60X90 (DRAPES) ×3 IMPLANT
DECANTER SPIKE VIAL GLASS SM (MISCELLANEOUS) ×1 IMPLANT
DERMABOND ADVANCED (GAUZE/BANDAGES/DRESSINGS) ×1
DERMABOND ADVANCED .7 DNX12 (GAUZE/BANDAGES/DRESSINGS) ×2 IMPLANT
DEVICE DUBIN W/COMP PLATE 8390 (MISCELLANEOUS) ×1 IMPLANT
DRAIN CHANNEL 19F RND (DRAIN) IMPLANT
DRAPE C-ARM 42X72 X-RAY (DRAPES) ×3 IMPLANT
DRAPE LAPAROSCOPIC ABDOMINAL (DRAPES) ×4 IMPLANT
DRAPE U-SHAPE 76X120 STRL (DRAPES) IMPLANT
DRSG TEGADERM 4X4.75 (GAUZE/BANDAGES/DRESSINGS) ×5 IMPLANT
ELECT COATED BLADE 2.86 ST (ELECTRODE) ×4 IMPLANT
ELECT REM PT RETURN 9FT ADLT (ELECTROSURGICAL) ×3
ELECTRODE REM PT RTRN 9FT ADLT (ELECTROSURGICAL) ×2 IMPLANT
EVACUATOR SILICONE 100CC (DRAIN) IMPLANT
GAUZE SPONGE 4X4 12PLY STRL LF (GAUZE/BANDAGES/DRESSINGS) ×4 IMPLANT
GLOVE BIO SURGEON STRL SZ7 (GLOVE) ×7 IMPLANT
GLOVE BIOGEL PI IND STRL 7.5 (GLOVE) ×2 IMPLANT
GLOVE BIOGEL PI INDICATOR 7.5 (GLOVE) ×4
GOWN PREVENTION PLUS XLARGE (GOWN DISPOSABLE) ×8 IMPLANT
IV KIT MINILOC 20X1 SAFETY (NEEDLE) IMPLANT
KIT CVR 48X5XPRB PLUP LF (MISCELLANEOUS) IMPLANT
KIT MARKER MARGIN INK (KITS) ×3 IMPLANT
KIT PORT POWER 8FR ISP CVUE (Catheter) ×1 IMPLANT
MARKER SKIN DUAL TIP RULER LAB (MISCELLANEOUS) ×1 IMPLANT
NDL HYPO 25X1 1.5 SAFETY (NEEDLE) ×2 IMPLANT
NDL SAFETY ECLIPSE 18X1.5 (NEEDLE) IMPLANT
NEEDLE HYPO 18GX1.5 SHARP (NEEDLE)
NEEDLE HYPO 25X1 1.5 SAFETY (NEEDLE) ×6 IMPLANT
NS IRRIG 1000ML POUR BTL (IV SOLUTION) ×1 IMPLANT
PACK BASIN DAY SURGERY FS (CUSTOM PROCEDURE TRAY) ×3 IMPLANT
PENCIL BUTTON HOLSTER BLD 10FT (ELECTRODE) ×4 IMPLANT
PIN SAFETY STERILE (MISCELLANEOUS) IMPLANT
SHEET MEDIUM DRAPE 40X70 STRL (DRAPES) ×1 IMPLANT
SLEEVE SCD COMPRESS KNEE MED (MISCELLANEOUS) ×3 IMPLANT
SPONGE LAP 18X18 X RAY DECT (DISPOSABLE) IMPLANT
SPONGE LAP 4X18 X RAY DECT (DISPOSABLE) ×3 IMPLANT
STAPLER VISISTAT 35W (STAPLE) ×4 IMPLANT
STOCKINETTE IMPERVIOUS LG (DRAPES) IMPLANT
STRIP CLOSURE SKIN 1/2X4 (GAUZE/BANDAGES/DRESSINGS) ×3 IMPLANT
SUT MNCRL AB 4-0 PS2 18 (SUTURE) ×3 IMPLANT
SUT MON AB 4-0 PC3 18 (SUTURE) ×3 IMPLANT
SUT MON AB 5-0 PS2 18 (SUTURE) IMPLANT
SUT PROLENE 2 0 SH DA (SUTURE) ×3 IMPLANT
SUT SILK 2 0 SH (SUTURE) IMPLANT
SUT SILK 2 0 TIES 17X18 (SUTURE)
SUT SILK 2-0 18XBRD TIE BLK (SUTURE) IMPLANT
SUT VIC AB 2-0 SH 27 (SUTURE) ×3
SUT VIC AB 2-0 SH 27XBRD (SUTURE) ×2 IMPLANT
SUT VIC AB 3-0 SH 27 (SUTURE) ×6
SUT VIC AB 3-0 SH 27X BRD (SUTURE) ×2 IMPLANT
SUT VIC AB 5-0 PS2 18 (SUTURE) IMPLANT
SUT VICRYL AB 3 0 TIES (SUTURE) IMPLANT
SYR 5ML LUER SLIP (SYRINGE) ×3 IMPLANT
SYR CONTROL 10ML LL (SYRINGE) ×4 IMPLANT
TOWEL OR 17X24 6PK STRL BLUE (TOWEL DISPOSABLE) ×4 IMPLANT
TOWEL OR NON WOVEN STRL DISP B (DISPOSABLE) ×3 IMPLANT
TUBE CONNECTING 20X1/4 (TUBING) ×3 IMPLANT
YANKAUER SUCT BULB TIP NO VENT (SUCTIONS) ×3 IMPLANT

## 2012-10-04 NOTE — Anesthesia Procedure Notes (Signed)
Procedure Name: LMA Insertion Date/Time: 10/04/2012 12:05 PM Performed by: Caren Macadam Pre-anesthesia Checklist: Patient identified, Emergency Drugs available, Suction available and Patient being monitored Patient Re-evaluated:Patient Re-evaluated prior to inductionOxygen Delivery Method: Circle System Utilized Preoxygenation: Pre-oxygenation with 100% oxygen Intubation Type: IV induction Ventilation: Mask ventilation without difficulty LMA: LMA inserted LMA Size: 4.0 Number of attempts: 1 Airway Equipment and Method: bite block Placement Confirmation: positive ETCO2 and breath sounds checked- equal and bilateral Tube secured with: Tape Dental Injury: Teeth and Oropharynx as per pre-operative assessment

## 2012-10-04 NOTE — Progress Notes (Signed)
I met Angela Rosario at breast clinic on 09/26/2012.  She rated her distress as "2".  I gave her information about available support services; she did request a referral to [facility].  I also gave her my contact information, should she need support assistance.

## 2012-10-04 NOTE — Progress Notes (Signed)
Dr Dwain Sarna notified of Chest x-ray report inconclusive. Order for repeat chest x-ray received. X-ray notified of order spoke to [place].

## 2012-10-04 NOTE — Transfer of Care (Signed)
Immediate Anesthesia Transfer of Care Note  Patient: Angela Rosario  Procedure(s) Performed: Procedure(s): RIGHT BREAST LUMPECTOMY WITH NEEDLE LOCALIZATION AND AXILLARY SENTINEL LYMPH NODE BX (Right) INSERTION PORT-A-CATH (Left)  Patient Location: PACU  Anesthesia Type:General  Level of Consciousness: awake and sedated  Airway & Oxygen Therapy: Patient Spontanous Breathing and Patient connected to face mask oxygen  Post-op Assessment: Report given to PACU RN, Post -op Vital signs reviewed and stable and Patient moving all extremities  Post vital signs: Reviewed and stable  Complications: No apparent anesthesia complications

## 2012-10-04 NOTE — H&P (View-Only) (Signed)
Patient ID: Angela Rosario, female   DOB: 02/28/1940, 72 y.o.   MRN: [id number]  Chief Complaint  Patient presents with  . Other    HPI Angela Rosario is a 72 y.o. female.  Referred by Dr Tisovec HPI 72 yof who palpated right breast mass not long after she had a nl mm.  This is near areola.  She underwent mm that showed mass present at site of palpable area.  An u/s showed a 7 mm lesion at 12 o'clcok.  MR shows a 1.5x1.7x1.5 cm right sided breast mass with no abnormal nodes or left breast abnormalities.  She reports no breast complaints outside of the mass she palpated.  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  Past Medical History  Diagnosis Date  . Syncope   . Diastolic dysfunction   . Hyperlipidemia   . Episode of dizziness     Mild episodes  . Orthostatic hypotension     Some episodes  . Arthritis     oa  . Stress incontinence, female     wears pads  . AC (acromioclavicular) joint bone spurs     bone spurs in neck  . 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 Diabetes mellitus     niddm; last A1C 6.2  . Skin cancer     Past Surgical History  Procedure Laterality Date  . Other surgical history      Hysterectomy  . Cholecystectomy  1992  . Abdominal hysterectomy  1985    1 ovary removed  . Total hip arthroplasty  02/23/2011    Procedure: TOTAL HIP ARTHROPLASTY;  Surgeon: Frank V Aluisio;  Location: [facility];  Service: Orthopedics;  Laterality: Left;  . Pubovaginal sling  02/03/2012    Procedure: PUBO-VAGINAL SLING;  Surgeon: Stephen Dahlstedt, MD;  Location: [facility];  Service: Urology;  Laterality: N/A;  1 HR  LYNX SLING    Family History  Problem Relation Age of Onset  . Breast cancer Maternal Aunt   . Breast cancer Maternal Uncle     Social History History  Substance Use Topics  . Smoking  status: Former Smoker -- 0.25 packs/day for 1 years    Quit date: 02/14/1957  . Smokeless tobacco: Never Used  . Alcohol Use: No    Allergies  Allergen Reactions  . Oxycodone Other (See Comments)    hallucinations    Current Outpatient Prescriptions  Medication Sig Dispense Refill  . acetaminophen (TYLENOL) 500 MG tablet Take 1-2 tablets (500-1,000 mg total) by mouth every 6 (six) hours as needed.  80 tablet  0  . Alpha-Lipoic Acid 600 MG CAPS Take 1 each by mouth daily.      . carvedilol (COREG) 6.25 MG tablet Take 1 tablet (6.25 mg total) by mouth 2 (two) times daily with a meal.  180 tablet  3  . ciprofloxacin (CIPRO) 250 MG tablet Take 1 tablet (250 mg total) by mouth 2 (two) times daily.      . glipiZIDE-metformin (METAGLIP) 2.5-500 MG per tablet Take 1 tablet by mouth every morning.       . lovastatin (MEVACOR) 10 MG tablet Take 10 mg by mouth at bedtime.       . Multiple Vitamin (MULTIVITAMIN) capsule Take 1 capsule by mouth daily.      . traMADol (ULTRAM)   50 MG tablet Take 1 tablet (50 mg total) by mouth every 6 (six) hours as needed for pain.  20 tablet  0  . tretinoin (RETIN-A) 0.025 % cream Apply topically at bedtime.       No current facility-administered medications for this visit.   Facility-Administered Medications Ordered in Other Visits  Medication Dose Route Frequency Provider Last Rate Last Dose  . gentamicin (GARAMYCIN) 160 mg in dextrose 5 % 50 mL IVPB  160 mg Intravenous 30 min Pre-Op Stephen Dahlstedt, MD        Review of Systems Review of Systems  Constitutional: Negative for fever, chills and unexpected weight change.  HENT: Negative for hearing loss, congestion, sore throat, trouble swallowing and voice change.   Eyes: Negative for visual disturbance.  Respiratory: Negative for cough and wheezing.   Cardiovascular: Negative for chest pain, palpitations and leg swelling.  Gastrointestinal: Negative for nausea, vomiting, abdominal pain, diarrhea,  constipation, blood in stool, abdominal distention and anal bleeding.  Genitourinary: Negative for hematuria, vaginal bleeding and difficulty urinating.  Musculoskeletal: Negative for arthralgias.  Skin: Negative for rash and wound.  Neurological: Negative for seizures, syncope and headaches.  Hematological: Negative for adenopathy. Does not bruise/bleed easily.  Psychiatric/Behavioral: Negative for confusion.    There were no vitals taken for this visit.  Physical Exam Physical Exam  Vitals reviewed. Constitutional: She appears well-developed and well-nourished.  Neck: Neck supple.  Cardiovascular: Normal rate, regular rhythm and normal heart sounds.   Pulmonary/Chest: Effort normal and breath sounds normal. She has no wheezes. She has no rales. Right breast exhibits mass. Right breast exhibits no inverted nipple, no nipple discharge, no skin change and no tenderness. Left breast exhibits no inverted nipple, no mass, no nipple discharge, no skin change and no tenderness.    Lymphadenopathy:    She has no cervical adenopathy.    She has no axillary adenopathy.       Right: No supraclavicular adenopathy present.       Left: No supraclavicular adenopathy present.    Data Reviewed MRI BILATERAL BREASTS WITHOUT AND WITH CONTRAST  Technique: Multiplanar, multisequence MR images of the right breast  were obtained prior to and following the intravenous administration  of 17ml of Multihance.  Labs: Labs were obtained at [address]. BUN is 12.  Creatinine is 0.7.  Comparison: Recent imaging examinations.  FINDINGS:  Breast composition: b: Scatteredfibroglandular tissue  Background parenchymal enhancement: Mild  Right breast: There is a 1.5 x 1.7 x 1.5 cm irregular enhancement  in the area of recent biopsy proven cancer of the right breast  subareolar 11 o'clock position with plateau type enhancement. The  overlying skin demonstrate no abnormal enhancement.  Left breast: No  mass or abnormal enhancement.  Lymph nodes: No abnormal appearing lymph nodes.  Ancillary findings: None.  Impression:  BI-RADS CATEGORY 6: Known biopsy-proven malignancy - appropriate  action should be taken.   Assessment    Right breast cancer    Plan    Right breast wire guided lumpectomy, right axillary sentinel node biopsy, port placement     She is good candidate for breast conservation but will need chemo/herceptin also. We discussed the staging and pathophysiology of breast cancer. We discussed all of the different options for treatment for breast cancer including surgery, chemotherapy, radiation therapy, Herceptin, and antiestrogen therapy.   We discussed a sentinel lymph node biopsy as she does not appear to having lymph node involvement right now. We discussed the performance of   that with injection of radioactive tracer and blue dye. We discussed that she would have an incision underneath her axillary hairline. We discussed that there is a bout a 10-20% chance of having a positive node with a sentinel lymph node biopsy and we will await the permanent pathology to make any other first further decisions in terms of her treatment. One of these options might be to return to the operating room to perform an axillary lymph node dissection. We discussed about a 1-2% risk lifetime of chronic shoulder pain as well as lymphedema associated with a sentinel lymph node biopsy.  We discussed the options for treatment of the breast cancer which included lumpectomy versus a mastectomy. We discussed the performance of the lumpectomy with a wire placement. We discussed a 10% chance of a positive margin requiring reexcision in the operating room. We also discussed that she may need radiation therapy or antiestrogen therapy or both if she undergoes lumpectomy. We discussed the mastectomy and the postoperative care for that as well. We discussed that there is no difference in her survival whether she  undergoes lumpectomy with radiation therapy or antiestrogen therapy versus a mastectomy. There is a slight difference in the local recurrence rate being 3-5% with lumpectomy and about 1% with a mastectomy. Port placement with risks/benefits was discussed.  I showed her the device and where it would be placed We discussed the risks of operation including bleeding, infection, possible reoperation. She understands her further therapy will be based on what her stages at the time of her operation.   Somalia Segler 09/26/2012, 4:48 PM    

## 2012-10-04 NOTE — Progress Notes (Signed)
Office notified for chest x-ray result. Dr Dwain Sarna at the [facility]. Neysa Bonito will page Dr Dwain Sarna for a return call.

## 2012-10-04 NOTE — Anesthesia Postprocedure Evaluation (Signed)
  Anesthesia Post-op Note  Patient: Angela Rosario  Procedure(s) Performed: Procedure(s): RIGHT BREAST LUMPECTOMY WITH NEEDLE LOCALIZATION AND AXILLARY SENTINEL LYMPH NODE BX (Right) INSERTION PORT-A-CATH (Left)  Patient Location: PACU  Anesthesia Type:General  Level of Consciousness: awake  Airway and Oxygen Therapy: Patient Spontanous Breathing  Post-op Pain: mild  Post-op Assessment: Post-op Vital signs reviewed, Patient's Cardiovascular Status Stable, Respiratory Function Stable, Patent Airway, No signs of Nausea or vomiting and Pain level controlled  Post-op Vital Signs: stable  Complications: No apparent anesthesia complications

## 2012-10-04 NOTE — Op Note (Signed)
Preoperative diagnosis: Clinical stage I right breast cancer Postoperative diagnosis: Same as above Procedure: #1 right breast lumpectomy #2 right axillary sentinel lymph node biopsy #3 left subclavian power port insertion Surgeon: Dr. Dwain Sarna Anesthesia: Gen. Drains: None Estimated blood loss: Minimal Specimens: #1 right breast lumpectomy with overlying skin marked with paint #2 right axillary sentinel lymph nodes x4 with counts ranging from [phone] #3 right axillary tissue Sponge and needle count correct at completion Disposition to recovery stable  Indications: This is a 73 year old female with a HER-2/neu amplified right breast cancer. She was seen In our multidisciplinary clinic. We decided on breast conservation therapy with placement of a port for systemic therapy given the biology of her tumor. We discussed all the risks and benefits of this.  Procedure: After informed consent was obtained the patient was taken to the operating room. She had her mammograms in the operating room. She was given 2 g of intravenous cefazolin. Sequential compression devices were placed on her legs. She was then placed under general anesthesia without complication. Her right breast and axilla were then prepped and draped in the standard sterile surgical fashion. A surgical timeout was performed.  The breast lesion was identified by ultrasound. I was able to locate this easily. It was also a palpable lesion. I located where it was most closely adherent to the skin. I then made a crescent-shaped incision that included a small portion of her areola on this side as that is where it was close to. I then dissected with cautery around this mass. I removed the mass and the surrounding tissue with an attempt to get a clear margin. I then marked this with paint. I felt like I had margin grossly around the tumor. Mammogram confirmed removal of the clip in the mass. This was also confirmed by radiology. I then closed  this with 2-0 Vicryl. I closed the dermis with 3-0 Vicryl and the skin with 4-0 Monocryl. I injected 20 cc of local. I then placed Dermabond and Steri-Strips overlying this.  I then located the sentinel nodes. I made a 2 cm incision just below the axillary hairline. I then entered through the axillary fascia with cautery. I identified the 4 nodes with the counts as listed as above. There was also a small amount of other tissue that came out while doing this there was marked as right axillary tissue. This was not a sentinel node. Hemostasis was then obtained. There was essentially no background radioactivity. The highest count was 20. I then closed the axillary fascia with 2-0 Vicryl. I closed the skin with 3-0 Vicryl for Monocryl. I injected 10 cc of Marcaine. I then placed steristrips and a sterile dressing.  We then changed the sets completely. The patient was then reprepped and draped with the arms tucked. This was done to begin the port placement. I placed a port on the left side. I accessed her subclavian vein on the first pass. I passed the wire. This was confirmed by fluoroscopy she had a lot of ectopy during this procedure in association with the wire as well as the line and even the dilator. This all stopped once I had removed all of the foreign body. I then made a pocket overlying the pectoralis fascia. I then tunneled a line between the 2 sites. I dilated up the tract. I then inserted the dilator and peel-away sheath. With just the sheath in place she actually had some ectopy as well. The wire was removed. I then placed a line.  I removed the peel-away sheath. I had placed the line into the atrium or distal cava. She was still having some ectopy at this point so I pulled it back to where it essentially is in the proximal cava before and did not have any more ectopy. I left it short because of that reason. I did not leave any deeper due to ectopy she was having during the procedure. I feel like this is  in a good position and hopefully will not migrate during treatment but I think is the safest way to proceed due to the ectopy. I then attached this to the port. I sutured the port in 2 places with 2-0 Prolene suture. This flushed easily and aspirated blood. I then placed heparin inside the port. I closed this with 3-0 Vicryl and 4-0 Monocryl. Dermabond was placed over this. She tolerated all this well. She was extubated and transferred to the recovery was stable condition.

## 2012-10-04 NOTE — Anesthesia Preprocedure Evaluation (Signed)
Anesthesia Evaluation  Patient identified by MRN, date of birth, ID band Patient awake    Reviewed: Allergy & Precautions, H&P , NPO status , Patient's Chart, lab work & pertinent test results  Airway Mallampati: II TM Distance: >3 FB Neck ROM: full    Dental no notable dental hx.    Pulmonary neg pulmonary ROS,  breath sounds clear to auscultation  Pulmonary exam normal       Cardiovascular Exercise Tolerance: Good hypertension, On Home Beta Blockers Rhythm:regular Rate:Normal  Diastolic dysfunction. Syncope. Orthostatic hypotension.   Neuro/Psych negative neurological ROS  negative psych ROS   GI/Hepatic negative GI ROS, Neg liver ROS,   Endo/Other  negative endocrine ROSdiabetes, Type 2, Oral Hypoglycemic Agents  Renal/GU negative Renal ROS  negative genitourinary   Musculoskeletal   Abdominal (+) + obese,   Peds  Hematology negative hematology ROS (+)   Anesthesia Other Findings   Reproductive/Obstetrics negative OB ROS                           Anesthesia Physical Anesthesia Plan  ASA: III  Anesthesia Plan: General   Post-op Pain Management:    Induction: Intravenous  Airway Management Planned: LMA  Additional Equipment:   Intra-op Plan:   Post-operative Plan: Extubation in OR  Informed Consent: I have reviewed the patients History and Physical, chart, labs and discussed the procedure including the risks, benefits and alternatives for the proposed anesthesia with the patient or authorized representative who has indicated his/her understanding and acceptance.     Plan Discussed with: CRNA and Surgeon  Anesthesia Plan Comments:         Anesthesia Quick Evaluation

## 2012-10-04 NOTE — Interval H&P Note (Signed)
History and Physical Interval Note:  10/04/2012 11:47 AM  Cyndy Freeze  has presented today for surgery, with the diagnosis of right breast cancer  The various methods of treatment have been discussed with the patient and family. After consideration of risks, benefits and other options for treatment, the patient has consented to  Procedure(s): RIGHT BREAST LUMPECTOMY WITH NEEDLE LOCALIZATION AND AXILLARY SENTINEL LYMPH NODE BX (Right) INSERTION PORT-A-CATH (N/A) as a surgical intervention .  The patient's history has been reviewed, patient examined, no change in status, stable for surgery.  I have reviewed the patient's chart and labs.  Questions were answered to the patient's satisfaction.     Dhani Imel

## 2012-10-05 ENCOUNTER — Other Ambulatory Visit (INDEPENDENT_AMBULATORY_CARE_PROVIDER_SITE_OTHER): Payer: Self-pay | Admitting: *Deleted

## 2012-10-05 ENCOUNTER — Telehealth (INDEPENDENT_AMBULATORY_CARE_PROVIDER_SITE_OTHER): Payer: Self-pay | Admitting: General Surgery

## 2012-10-05 ENCOUNTER — Encounter (HOSPITAL_BASED_OUTPATIENT_CLINIC_OR_DEPARTMENT_OTHER): Payer: Self-pay | Admitting: General Surgery

## 2012-10-05 DIAGNOSIS — C50919 Malignant neoplasm of unspecified site of unspecified female breast: Secondary | ICD-10-CM

## 2012-10-05 MED ORDER — UNABLE TO FIND: Status: DC

## 2012-10-05 NOTE — Telephone Encounter (Signed)
Ram[facility], [facility] [place], called for dispensing orders for this pt, to include L8015 and L8000.

## 2012-10-08 ENCOUNTER — Telehealth (INDEPENDENT_AMBULATORY_CARE_PROVIDER_SITE_OTHER): Payer: Self-pay | Admitting: General Surgery

## 2012-10-08 NOTE — Telephone Encounter (Signed)
Pt called to ask about bandages from surgery in her axilla.  They are still intact from surgery on 10/04/12.  Instructed her to remove them now.  OK to wash in shower with soap and water, then rinse well and pat dry.  She understands and will comply.

## 2012-10-10 ENCOUNTER — Telehealth (INDEPENDENT_AMBULATORY_CARE_PROVIDER_SITE_OTHER): Payer: Self-pay

## 2012-10-10 NOTE — Telephone Encounter (Signed)
Pt calling for path result. I did not see result in epic from surgery. I advised pt I will send msg to Dr Dwain Sarna and his assistant for a return call.

## 2012-10-11 ENCOUNTER — Telehealth (INDEPENDENT_AMBULATORY_CARE_PROVIDER_SITE_OTHER): Payer: Self-pay | Admitting: General Surgery

## 2012-10-11 NOTE — Telephone Encounter (Signed)
I discussed path with her. Nodes are negative.  Her tumor is 1.6 and clinically and pathologically involved skin.  The close area anterior is a very thin layer of epidermis or skin that was already removed.  I do not think she needs more surgery for this.  I think reasonable to proceed with xrt, chemotherapy/herceptin.

## 2012-10-17 ENCOUNTER — Telehealth (INDEPENDENT_AMBULATORY_CARE_PROVIDER_SITE_OTHER): Payer: Self-pay

## 2012-10-17 NOTE — Telephone Encounter (Signed)
Called pt back after speaking with Dr Dwain Sarna. I advised pt that she is doing the correct thing by putting ice on the area but heat may feel better. I advised pt that as long as she is not having any redness,drainage,or fever then she is fine waiting to her appt on Friday. I advised her if she is really worried about it today I would work her into our schedule to see Dr Dwain Sarna but if she is ok with just waiting till Friday then we would leave it a lone. The pt is just fine with waiting till Friday.

## 2012-10-17 NOTE — Telephone Encounter (Signed)
Pt calling b/c has swelling and pain at the lymph node bx site. The pt had a lumpectomy w/sent. Node bx on 10/04/12. The pt just started having the swelling last night and she put ice on the area which helped some but the swelling is still there today. I advised pt that I would ask Dr Dwain Sarna about the swelling and get back in touch with her today. The pt has a f/u appt with Dr Dwain Sarna on 10/19/12.

## 2012-10-19 ENCOUNTER — Ambulatory Visit (INDEPENDENT_AMBULATORY_CARE_PROVIDER_SITE_OTHER): Payer: Medicare Other | Admitting: General Surgery

## 2012-10-19 ENCOUNTER — Encounter (INDEPENDENT_AMBULATORY_CARE_PROVIDER_SITE_OTHER): Payer: Self-pay | Admitting: General Surgery

## 2012-10-19 VITALS — BP 132/68 | HR 69 | Temp 98.8°F | Resp 19 | Ht 66.0 in | Wt 189.0 lb

## 2012-10-19 DIAGNOSIS — C50911 Malignant neoplasm of unspecified site of right female breast: Secondary | ICD-10-CM

## 2012-10-19 DIAGNOSIS — Z09 Encounter for follow-up examination after completed treatment for conditions other than malignant neoplasm: Secondary | ICD-10-CM

## 2012-10-19 DIAGNOSIS — C50919 Malignant neoplasm of unspecified site of unspecified female breast: Secondary | ICD-10-CM

## 2012-10-19 NOTE — Patient Instructions (Signed)
      [facility] CLASS After Breast Cancer Class  After Breast Cancer Class is a specially designed exercise class to assist you in a safe recovery after having breast cancer surgery.  In this class you will learn how to get back to full function whether your drains were just removed or if you had surgery a month ago.  This one-time class is held the 1st and 3rd Monday of every month from 11:00 a.m. until 12:00 noon at the [facility] located at [address]  This class is FREE and space is limited.  For more information or to register for the next available class, call [phone].  Class Goals   Understand specific stretches to improve the flexibility of your chest and shoulder.   Learn ways to safely strengthen your upper body and improve your posture.   Understand the warning signs of infection and why you may be at risk for an arm infection.   Learn about Lymphedema and prevention.  **You do not attend this class until after surgery.  Drains must be removed to participate.     Donna Salisbury, PT, CLT Marti Smith, PT, CLT        

## 2012-10-19 NOTE — Progress Notes (Signed)
Subjective:     Patient ID: Angela Rosario, female   DOB: [birth year], 72 y.o.   MRN: [id number]  HPI 38 yof who presented to multidisciplinary clinic with right breast cancer. She underwent right lumpectomy with snbx showing a T1cN0 tumor.  The tumor is 1.6 cm and is triple positive.  She had port placed at same time.  Her margin is close but I removed the skin and remainder is only skin so I don't think any more surgery is needed.  She had some swelling under arm which is resolving now.   She and her sister do report that they have found that an uncle and his sister both had breast cancer and would like to talk to [employer], [employer]/film/video. She is also interested in ntn class  Review of Systems      Objective:   Physical Exam    port in place Right breast incision and axillary incision healing well without infection  Assessment:     Stage I triple positive breast cancer s/p lump/sn    Plan:     We discussed pathology today.  I think she is ready to begin chemo/herceptin whenever oncology is ready to start.  I don't think she needs further surgery and we again discussed need for adjuvant therapy including chemo, herceptin, antiestrogen therapy and radiotherapy.  I referred to [facility] PT class today also and told her she could increase activity as tolerated. I have also referred her to see genetics with Maylon Cos.

## 2012-10-30 ENCOUNTER — Emergency Department (HOSPITAL_COMMUNITY): Payer: Medicare Other

## 2012-10-30 ENCOUNTER — Encounter (HOSPITAL_COMMUNITY): Payer: Self-pay | Admitting: *Deleted

## 2012-10-30 ENCOUNTER — Emergency Department (HOSPITAL_COMMUNITY)
Admission: EM | Admit: 2012-10-30 | Discharge: 2012-10-30 | Disposition: A | Discharge status: Home / Self Care | Payer: Medicare Other | Attending: Emergency Medicine | Admitting: Emergency Medicine

## 2012-10-30 DIAGNOSIS — Y929 Unspecified place or not applicable: Secondary | ICD-10-CM | POA: Insufficient documentation

## 2012-10-30 DIAGNOSIS — Z789 Other specified health status: Secondary | ICD-10-CM | POA: Insufficient documentation

## 2012-10-30 DIAGNOSIS — S0003XA Contusion of scalp, initial encounter: Secondary | ICD-10-CM | POA: Insufficient documentation

## 2012-10-30 DIAGNOSIS — M129 Arthropathy, unspecified: Secondary | ICD-10-CM | POA: Insufficient documentation

## 2012-10-30 DIAGNOSIS — Z79899 Other long term (current) drug therapy: Secondary | ICD-10-CM | POA: Insufficient documentation

## 2012-10-30 DIAGNOSIS — IMO0002 Reserved for concepts with insufficient information to code with codable children: Secondary | ICD-10-CM | POA: Insufficient documentation

## 2012-10-30 DIAGNOSIS — Z85828 Personal history of other malignant neoplasm of skin: Secondary | ICD-10-CM | POA: Insufficient documentation

## 2012-10-30 DIAGNOSIS — I10 Essential (primary) hypertension: Secondary | ICD-10-CM | POA: Insufficient documentation

## 2012-10-30 DIAGNOSIS — S5291XA Unspecified fracture of right forearm, initial encounter for closed fracture: Secondary | ICD-10-CM

## 2012-10-30 DIAGNOSIS — E785 Hyperlipidemia, unspecified: Secondary | ICD-10-CM | POA: Insufficient documentation

## 2012-10-30 DIAGNOSIS — W010XXA Fall on same level from slipping, tripping and stumbling without subsequent striking against object, initial encounter: Secondary | ICD-10-CM | POA: Insufficient documentation

## 2012-10-30 DIAGNOSIS — E119 Type 2 diabetes mellitus without complications: Secondary | ICD-10-CM | POA: Insufficient documentation

## 2012-10-30 DIAGNOSIS — S52599A Other fractures of lower end of unspecified radius, initial encounter for closed fracture: Secondary | ICD-10-CM | POA: Insufficient documentation

## 2012-10-30 DIAGNOSIS — N393 Stress incontinence (female) (male): Secondary | ICD-10-CM | POA: Insufficient documentation

## 2012-10-30 DIAGNOSIS — Y939 Activity, unspecified: Secondary | ICD-10-CM | POA: Insufficient documentation

## 2012-10-30 DIAGNOSIS — Z87891 Personal history of nicotine dependence: Secondary | ICD-10-CM | POA: Insufficient documentation

## 2012-10-30 DIAGNOSIS — I519 Heart disease, unspecified: Secondary | ICD-10-CM | POA: Insufficient documentation

## 2012-10-30 DIAGNOSIS — H3552 Pigmentary retinal dystrophy: Secondary | ICD-10-CM | POA: Insufficient documentation

## 2012-10-30 DIAGNOSIS — Z885 Allergy status to narcotic agent status: Secondary | ICD-10-CM | POA: Insufficient documentation

## 2012-10-30 NOTE — Progress Notes (Signed)
Orthopedic Tech Progress Note Patient Details:  Angela Rosario 10/30/1940 [id number]  Ortho Devices Type of Ortho Device: Ace wrap;Arm sling;Sugartong splint Ortho Device/Splint Location: LUE Ortho Device/Splint Interventions: Ordered;Application   [name] 10/30/2012, 8:15 PM

## 2012-10-30 NOTE — ED Provider Notes (Signed)
CSN: [id number]     Arrival date & time 10/30/12  1716 History   First MD Initiated Contact with Patient 10/30/12 1751     Chief Complaint  Patient presents with  . Fall  . Arm Injury   (Consider location/radiation/quality/duration/timing/severity/associated sxs/prior Treatment) HPI 7 YOF presents to the emergency room with left wrist pain after a fall this afternoon. Pt tripped over ferns and fell onto her outstretched left hand and hit the left part of her forehead on a concrete step. She has pain in her left wrist, decreased ROM in her wrist, but denies any weakness or sensory changes in her fingers and thumb. She denies loss of consciousness, syncope, headache, visual changes, weakness, numbness, sensory changes, dizziness, shoulder pain, left elbow pain, right extremity pain, lower extremity pain, chest pain, shortness of breath, fever, chills. She has not taken anything medication for the pain; has used ice on the left wrist. Past Medical History  Diagnosis Date  . Syncope   . Diastolic dysfunction   . Hyperlipidemia   . Episode of dizziness     Mild episodes  . Orthostatic hypotension     Some episodes  . Arthritis     oa  . Stress incontinence, female     wears pads  . AC (acromioclavicular) joint bone spurs     bone spurs in neck  . 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 Diabetes mellitus     niddm; last A1C 6.2  . Skin cancer   . Hypertension   . Wears glasses    Past Surgical History  Procedure Laterality Date  . Other surgical history      Hysterectomy  . Cholecystectomy  1992  . Abdominal hysterectomy  1985    1 ovary removed  . Total hip arthroplasty  02/23/2011    Procedure: TOTAL HIP ARTHROPLASTY;  Surgeon: Gus Rankin Aluisio;  Location: [facility];  Service: Orthopedics;  Laterality: Left;  . Pubovaginal sling  02/03/2012    Procedure: Leonides Grills;  Surgeon: Marcine Matar, MD;  Location: [facility];  Service:  Urology;  Laterality: N/A;  1 HR  LYNX SLING  . Tonsillectomy    . Breast lumpectomy with needle localization and axillary sentinel lymph node bx Right 10/04/2012    Procedure: RIGHT BREAST LUMPECTOMY WITH NEEDLE LOCALIZATION AND AXILLARY SENTINEL LYMPH NODE BX;  Surgeon: Emelia Loron, MD;  Location: [facility];  Service: General;  Laterality: Right;  . Portacath placement Left 10/04/2012    Procedure: INSERTION PORT-A-CATH;  Surgeon: Emelia Loron, MD;  Location: [facility];  Service: General;  Laterality: Left;   Family History  Problem Relation Age of Onset  . Breast cancer Maternal Aunt   . Breast cancer Maternal Uncle    History  Substance Use Topics  . Smoking status: Former Smoker -- 0.25 packs/day for 1 years    Quit date: 02/14/1957  . Smokeless tobacco: Never Used  . Alcohol Use: No   OB History   Grav Para Term Preterm Abortions TAB SAB Ect Mult Living                 Review of Systems All other systems negative except as documented in the HPI. All pertinent positives and negatives as reviewed in the HPI.  Allergies  Oxycodone  Home Medications   Current Outpatient Rx  Name  Route  Sig  Dispense  Refill  . acetaminophen (TYLENOL) 500 MG tablet  Oral   Take 1-2 tablets (500-1,000 mg total) by mouth every 6 (six) hours as needed.   80 tablet   0   . Alpha-Lipoic Acid 600 MG CAPS   Oral   Take 1 each by mouth daily.         . carvedilol (COREG) 6.25 MG tablet   Oral   Take 1 tablet (6.25 mg total) by mouth 2 (two) times daily with a meal.   180 tablet   3   . glipiZIDE-metformin (METAGLIP) 2.5-500 MG per tablet   Oral   Take 1 tablet by mouth every morning.          . lovastatin (MEVACOR) 10 MG tablet   Oral   Take 10 mg by mouth at bedtime.          . Multiple Vitamin (MULTIVITAMIN) capsule   Oral   Take 1 capsule by mouth daily.         Marland Kitchen tretinoin (RETIN-A) 0.025 % cream   Topical   Apply  topically at bedtime.         Marland Kitchen UNABLE TO FIND      Rx: L8015-Post Mastectomy Camisole (Quantity: 2); L8000-Post Surgical Bras (Quantity: 6) Dx: 174.9 Right partial mastectomy   1 each   0   . dexamethasone (DECADRON) 4 MG tablet   Oral   Take 2 tablets (8 mg total) by mouth 2 (two) times daily with a meal. Take two times a day the day before Taxotere. Then take two times a day starting the day after chemo for 3 days.   30 tablet   1   . LORazepam (ATIVAN) 0.5 MG tablet   Oral   Take 1 tablet (0.5 mg total) by mouth every 6 (six) hours as needed (Nausea or vomiting).   30 tablet   0   . ondansetron (ZOFRAN) 8 MG tablet   Oral   Take 1 tablet (8 mg total) by mouth 2 (two) times daily. Take two times a day starting the day after chemo for 3 days. Then take two times a day as needed for nausea or vomiting.   30 tablet   1   . prochlorperazine (COMPAZINE) 10 MG tablet   Oral   Take 1 tablet (10 mg total) by mouth every 6 (six) hours as needed (Nausea or vomiting).   30 tablet   1    BP 135/64  Pulse 70  Temp(Src) 98.2 F (36.8 C) (Oral)  Resp 16  SpO2 100% Physical Exam  Nursing note and vitals reviewed. Constitutional: She is oriented to person, place, and time. Vital signs are normal. She appears well-developed and well-nourished.  Non-toxic appearance. She does not have a sickly appearance. She does not appear ill. No distress.  HENT:  Head: Normocephalic. Head is with contusion (hematoma over left eyebrow. tender to touch.). Head is without laceration.    Eyes: Pupils are equal, round, and reactive to light.  Neck: Trachea normal and normal range of motion. Neck supple. No spinous process tenderness and no muscular tenderness present. Normal range of motion present.  Cardiovascular: Normal rate, regular rhythm, intact distal pulses and normal pulses.   Pulmonary/Chest: Breath sounds normal. Accessory muscle usage present. No respiratory distress.  Musculoskeletal:        Right shoulder: Normal.       Left shoulder: Normal.       Right elbow: Normal.      Left elbow: Normal.  Right wrist: She exhibits laceration (small skin abrasion and ecchymosis over forearm. non-tender to touch. full ROM). She exhibits normal range of motion, no tenderness, no bony tenderness, no swelling, no effusion, no crepitus and no deformity.       Left wrist: She exhibits decreased range of motion, tenderness (tender diffusely over wrist. left>right wrist pain to palpation.), bony tenderness, swelling, effusion, deformity and laceration (skin abrasion over proximal radius).       Right hip: Normal.       Left hip: Normal.       Right knee: Normal.       Left knee: Normal.       Cervical back: Normal.       Thoracic back: Normal.       Arms: Neurological: She is alert and oriented to person, place, and time. She has normal strength. No cranial nerve deficit or sensory deficit. Coordination normal.  Skin: Skin is warm and dry. Abrasion, bruising, ecchymosis, laceration and lesion noted. There is erythema.  Psychiatric: She has a normal mood and affect. Her behavior is normal. Judgment and thought content normal.    ED Course  Procedures (including critical care time) Labs Review Labs Reviewed - No data to display Imaging Review Dg Forearm Left  10/30/2012   CLINICAL DATA:  Fall, forearm pain  EXAM: LEFT FOREARM - 2 VIEW  COMPARISON:  None.  FINDINGS: Two views of left forearm submitted. There is nondisplaced minimal impacted fracture in distal left radial metaphysis.  IMPRESSION: Nondisplaced minimally impacted fracture in distal left radial metaphysis.   Electronically Signed   By: Natasha Mead   On: 10/30/2012 18:20  Patient is referred to ortho hand at [facility]. Told to return here as needed. Ice and elevate the wrist.   MDM     Carlyle Dolly, PA-C 11/02/12 1511

## 2012-10-30 NOTE — ED Notes (Signed)
Pt not in room yet.

## 2012-10-30 NOTE — ED Notes (Signed)
Pt to xray

## 2012-10-30 NOTE — ED Notes (Signed)
Pt reports falling down at least 6 steps. Has possible fracture to left forearm and has bruise to left forehead. States unable to move digits to left hand, +radial pulse.

## 2012-10-31 ENCOUNTER — Encounter (HOSPITAL_BASED_OUTPATIENT_CLINIC_OR_DEPARTMENT_OTHER): Payer: Self-pay | Admitting: *Deleted

## 2012-10-31 NOTE — Progress Notes (Signed)
Pt here 8/14 for lumpectomy and PAC-has not started chemo yet-to come in for bmet

## 2012-11-01 ENCOUNTER — Encounter (HOSPITAL_BASED_OUTPATIENT_CLINIC_OR_DEPARTMENT_OTHER)
Admission: RE | Admit: 2012-11-01 | Discharge: 2012-11-01 | Disposition: A | Discharge status: Home / Self Care | Payer: Medicare Other | Source: Ambulatory Visit | Attending: Orthopedic Surgery | Admitting: Orthopedic Surgery

## 2012-11-01 ENCOUNTER — Other Ambulatory Visit: Payer: Self-pay | Admitting: Orthopedic Surgery

## 2012-11-01 LAB — BASIC METABOLIC PANEL
CO2: 26 mEq/L (ref 19–32)
Chloride: 96 mEq/L (ref 96–112)
Glucose, Bld: 120 mg/dL — ABNORMAL HIGH (ref 70–99)
Potassium: 4.2 mEq/L (ref 3.5–5.1)
Sodium: 135 mEq/L (ref 135–145)

## 2012-11-02 ENCOUNTER — Encounter: Payer: Self-pay | Admitting: *Deleted

## 2012-11-02 ENCOUNTER — Telehealth: Payer: Self-pay | Admitting: *Deleted

## 2012-11-02 NOTE — Telephone Encounter (Signed)
Called pt to schedule a genetic appt and she requested for me to call her sister cause her sister brings her to all appts.  Called sister Winona Legato) and confirmed 01/24/13 genetic appt w/ her.  Mailed calendar to pt.

## 2012-11-02 NOTE — ED Provider Notes (Signed)
Medical screening examination/treatment/procedure(s) were conducted as a shared visit with non-physician practitioner(s) and myself.  I personally evaluated the patient during the encounter.  Status post fall with distal left radial fracture.  No head or neck trauma. Immobilization. Referral to orthopedics  Donnetta Hutching, MD 11/02/12 [phone]

## 2012-11-02 NOTE — Progress Notes (Signed)
Emailed Alisha at [facility] to make her aware of the appt.

## 2012-11-06 ENCOUNTER — Ambulatory Visit (HOSPITAL_BASED_OUTPATIENT_CLINIC_OR_DEPARTMENT_OTHER): Payer: Medicare Other | Admitting: Anesthesiology

## 2012-11-06 ENCOUNTER — Ambulatory Visit (HOSPITAL_BASED_OUTPATIENT_CLINIC_OR_DEPARTMENT_OTHER)
Admission: RE | Admit: 2012-11-06 | Discharge: 2012-11-06 | Disposition: A | Discharge status: Home / Self Care | Payer: Medicare Other | Source: Ambulatory Visit | Attending: Orthopedic Surgery | Admitting: Orthopedic Surgery

## 2012-11-06 ENCOUNTER — Encounter (HOSPITAL_BASED_OUTPATIENT_CLINIC_OR_DEPARTMENT_OTHER): Payer: Self-pay | Admitting: Anesthesiology

## 2012-11-06 ENCOUNTER — Encounter (HOSPITAL_BASED_OUTPATIENT_CLINIC_OR_DEPARTMENT_OTHER)
Admission: RE | Disposition: A | Discharge status: Home / Self Care | Payer: Self-pay | Source: Ambulatory Visit | Attending: Orthopedic Surgery

## 2012-11-06 ENCOUNTER — Encounter (HOSPITAL_BASED_OUTPATIENT_CLINIC_OR_DEPARTMENT_OTHER): Payer: Self-pay

## 2012-11-06 DIAGNOSIS — E785 Hyperlipidemia, unspecified: Secondary | ICD-10-CM | POA: Insufficient documentation

## 2012-11-06 DIAGNOSIS — I10 Essential (primary) hypertension: Secondary | ICD-10-CM | POA: Insufficient documentation

## 2012-11-06 DIAGNOSIS — W19XXXA Unspecified fall, initial encounter: Secondary | ICD-10-CM | POA: Insufficient documentation

## 2012-11-06 DIAGNOSIS — E119 Type 2 diabetes mellitus without complications: Secondary | ICD-10-CM | POA: Insufficient documentation

## 2012-11-06 DIAGNOSIS — S52599A Other fractures of lower end of unspecified radius, initial encounter for closed fracture: Secondary | ICD-10-CM | POA: Insufficient documentation

## 2012-11-06 HISTORY — PX: ORIF WRIST FRACTURE: SHX2133

## 2012-11-06 SURGERY — OPEN REDUCTION INTERNAL FIXATION (ORIF) WRIST FRACTURE
Anesthesia: General | Site: Wrist | Laterality: Left | Wound class: Clean

## 2012-11-06 MED ORDER — ONDANSETRON HCL 4 MG/2ML IJ SOLN
INTRAMUSCULAR | Status: DC | PRN
  Administered 2012-11-06: 4 mg via INTRAVENOUS

## 2012-11-06 MED ORDER — MIDAZOLAM HCL 5 MG/5ML IJ SOLN
INTRAMUSCULAR | Status: DC | PRN
  Administered 2012-11-06: 1 mg via INTRAVENOUS

## 2012-11-06 MED ORDER — LACTATED RINGERS IV SOLN
INTRAVENOUS | Status: DC
  Administered 2012-11-06 (×2): via INTRAVENOUS

## 2012-11-06 MED ORDER — OXYCODONE HCL 5 MG/5ML PO SOLN: 5.0000 mg | Freq: Once | ORAL | Status: DC | PRN

## 2012-11-06 MED ORDER — OXYCODONE HCL 5 MG PO TABS: 5.0000 mg | ORAL_TABLET | Freq: Once | ORAL | Status: DC | PRN

## 2012-11-06 MED ORDER — BUPIVACAINE-EPINEPHRINE PF 0.5-1:200000 % IJ SOLN
INTRAMUSCULAR | Status: DC | PRN
  Administered 2012-11-06: 30 mL

## 2012-11-06 MED ORDER — DEXAMETHASONE SODIUM PHOSPHATE 4 MG/ML IJ SOLN
INTRAMUSCULAR | Status: DC | PRN
  Administered 2012-11-06: 8 mg via INTRAVENOUS

## 2012-11-06 MED ORDER — 0.9 % SODIUM CHLORIDE (POUR BTL) OPTIME
TOPICAL | Status: DC | PRN
  Administered 2012-11-06: 300 mL

## 2012-11-06 MED ORDER — MIDAZOLAM HCL 2 MG/2ML IJ SOLN
1.0000 mg | INTRAMUSCULAR | Status: DC | PRN
  Administered 2012-11-06: 1 mg via INTRAVENOUS

## 2012-11-06 MED ORDER — FENTANYL CITRATE 0.05 MG/ML IJ SOLN
50.0000 ug | INTRAMUSCULAR | Status: DC | PRN
  Administered 2012-11-06: 50 ug via INTRAVENOUS

## 2012-11-06 MED ORDER — LIDOCAINE HCL (CARDIAC) 20 MG/ML IV SOLN
INTRAVENOUS | Status: DC | PRN
  Administered 2012-11-06: 50 mg via INTRAVENOUS

## 2012-11-06 MED ORDER — FENTANYL CITRATE 0.05 MG/ML IJ SOLN
INTRAMUSCULAR | Status: DC | PRN
  Administered 2012-11-06: 50 ug via INTRAVENOUS

## 2012-11-06 MED ORDER — PROMETHAZINE HCL 25 MG/ML IJ SOLN: 6.2500 mg | INTRAMUSCULAR | Status: DC | PRN

## 2012-11-06 MED ORDER — LIDOCAINE-EPINEPHRINE (PF) 1.5 %-1:200000 IJ SOLN
INTRAMUSCULAR | Status: DC | PRN
  Administered 2012-11-06: 25 mL

## 2012-11-06 MED ORDER — MEPERIDINE HCL 25 MG/ML IJ SOLN: 6.2500 mg | INTRAMUSCULAR | Status: DC | PRN

## 2012-11-06 MED ORDER — CHLORHEXIDINE GLUCONATE 4 % EX LIQD: 60.0000 mL | Freq: Once | CUTANEOUS | Status: DC

## 2012-11-06 MED ORDER — MIDAZOLAM HCL 2 MG/2ML IJ SOLN: 0.5000 mg | Freq: Once | INTRAMUSCULAR | Status: DC | PRN

## 2012-11-06 MED ORDER — HYDROCODONE-ACETAMINOPHEN 5-325 MG PO TABS: ORAL_TABLET | ORAL | Status: DC

## 2012-11-06 MED ORDER — CEFAZOLIN SODIUM-DEXTROSE 2-3 GM-% IV SOLR
INTRAVENOUS | Status: DC | PRN
  Administered 2012-11-06: 2 g via INTRAVENOUS

## 2012-11-06 MED ORDER — HYDROMORPHONE HCL PF 1 MG/ML IJ SOLN
0.2500 mg | INTRAMUSCULAR | Status: DC | PRN
  Administered 2012-11-06 (×2): 0.5 mg via INTRAVENOUS

## 2012-11-06 MED ORDER — PROPOFOL 10 MG/ML IV BOLUS
INTRAVENOUS | Status: DC | PRN
  Administered 2012-11-06: 150 mg via INTRAVENOUS

## 2012-11-06 SURGICAL SUPPLY — 72 items
BANDAGE ELASTIC 3 VELCRO ST LF (GAUZE/BANDAGES/DRESSINGS) ×2 IMPLANT
BANDAGE GAUZE ELAST BULKY 4 IN (GAUZE/BANDAGES/DRESSINGS) ×2 IMPLANT
BIT DRILL 2.0 LNG QUCK RELEASE (BIT) IMPLANT
BIT DRILL 2.8X5 QR DISP (BIT) ×1 IMPLANT
BLADE MINI RND TIP GREEN BEAV (BLADE) IMPLANT
BLADE SURG 15 STRL LF DISP TIS (BLADE) ×2 IMPLANT
BLADE SURG 15 STRL SS (BLADE) ×4
BNDG CMPR 9X4 STRL LF SNTH (GAUZE/BANDAGES/DRESSINGS) ×1
BNDG ESMARK 4X9 LF (GAUZE/BANDAGES/DRESSINGS) ×2 IMPLANT
CHLORAPREP W/TINT 26ML (MISCELLANEOUS) ×2 IMPLANT
CLOTH BEACON ORANGE TIMEOUT ST (SAFETY) ×2 IMPLANT
CORDS BIPOLAR (ELECTRODE) ×2 IMPLANT
COVER MAYO STAND STRL (DRAPES) ×2 IMPLANT
COVER TABLE BACK 60X90 (DRAPES) ×2 IMPLANT
DRAPE EXTREMITY T 121X128X90 (DRAPE) ×2 IMPLANT
DRAPE OEC MINIVIEW 54X84 (DRAPES) ×2 IMPLANT
DRAPE SURG 17X23 STRL (DRAPES) ×2 IMPLANT
DRILL 2.0 LNG QUICK RELEASE (BIT) ×2
DRSG TEGADERM 4X4.75 (GAUZE/BANDAGES/DRESSINGS) ×2 IMPLANT
GAUZE XEROFORM 1X8 LF (GAUZE/BANDAGES/DRESSINGS) ×2 IMPLANT
GLOVE BIO SURGEON STRL SZ7 (GLOVE) ×1 IMPLANT
GLOVE BIO SURGEON STRL SZ7.5 (GLOVE) ×2 IMPLANT
GLOVE BIOGEL PI IND STRL 7.5 (GLOVE) IMPLANT
GLOVE BIOGEL PI IND STRL 8 (GLOVE) ×1 IMPLANT
GLOVE BIOGEL PI IND STRL 8.5 (GLOVE) IMPLANT
GLOVE BIOGEL PI INDICATOR 7.5 (GLOVE) ×1
GLOVE BIOGEL PI INDICATOR 8 (GLOVE) ×1
GLOVE BIOGEL PI INDICATOR 8.5 (GLOVE) ×1
GLOVE EXAM NITRILE MD LF STRL (GLOVE) ×1 IMPLANT
GLOVE SURG ORTHO 8.0 STRL STRW (GLOVE) ×1 IMPLANT
GOWN BRE IMP PREV XXLGXLNG (GOWN DISPOSABLE) ×3 IMPLANT
GOWN PREVENTION PLUS XLARGE (GOWN DISPOSABLE) ×2 IMPLANT
GUIDEWIRE ORTHO 0.054X6 (WIRE) ×3 IMPLANT
NDL HYPO 25X1 1.5 SAFETY (NEEDLE) IMPLANT
NEEDLE HYPO 22GX1.5 SAFETY (NEEDLE) IMPLANT
NEEDLE HYPO 25X1 1.5 SAFETY (NEEDLE) IMPLANT
NS IRRIG 1000ML POUR BTL (IV SOLUTION) ×2 IMPLANT
PACK BASIN DAY SURGERY FS (CUSTOM PROCEDURE TRAY) ×2 IMPLANT
PAD CAST 3X4 CTTN HI CHSV (CAST SUPPLIES) ×1 IMPLANT
PAD CAST 4YDX4 CTTN HI CHSV (CAST SUPPLIES) IMPLANT
PADDING CAST ABS 4INX4YD NS (CAST SUPPLIES) ×1
PADDING CAST ABS COTTON 4X4 ST (CAST SUPPLIES) ×1 IMPLANT
PADDING CAST COTTON 3X4 STRL (CAST SUPPLIES) ×2
PADDING CAST COTTON 4X4 STRL (CAST SUPPLIES) ×2
PLATE LEFT DIST RADIUS NARROW (Plate) ×1 IMPLANT
SCREW ACTK 2 NL HEX 3.5.11 (Screw) ×1 IMPLANT
SCREW CORT FT 18X2.3XLCK HEX (Screw) IMPLANT
SCREW CORT FT 20X2.3XLCK HEX (Screw) IMPLANT
SCREW CORTICAL LOCKING 2.3X18M (Screw) ×6 IMPLANT
SCREW CORTICAL LOCKING 2.3X20M (Screw) ×6 IMPLANT
SCREW FX18X2.3XSMTH LCK NS CRT (Screw) IMPLANT
SCREW FX20X2.3XSMTH LCK NS CRT (Screw) IMPLANT
SCREW NLCKG 13 3.5X13 HEXA (Screw) IMPLANT
SCREW NON TOGG 2.3X22MM (Screw) ×1 IMPLANT
SCREW NON-LOCK 3.5X13 (Screw) ×2 IMPLANT
SCREW NONLOCK HEX 3.5X12 (Screw) ×1 IMPLANT
SLEEVE SCD COMPRESS KNEE MED (MISCELLANEOUS) ×2 IMPLANT
SPLINT PLASTER CAST XFAST 3X15 (CAST SUPPLIES) ×10 IMPLANT
SPLINT PLASTER XTRA FASTSET 3X (CAST SUPPLIES) ×10
SPONGE GAUZE 4X4 12PLY (GAUZE/BANDAGES/DRESSINGS) ×2 IMPLANT
STOCKINETTE 4X48 STRL (DRAPES) ×2 IMPLANT
SUCTION FRAZIER TIP 10 FR DISP (SUCTIONS) IMPLANT
SUT ETHILON 3 0 PS 1 (SUTURE) IMPLANT
SUT ETHILON 4 0 PS 2 18 (SUTURE) ×4 IMPLANT
SUT VIC AB 3-0 PS1 18 (SUTURE)
SUT VIC AB 3-0 PS1 18XBRD (SUTURE) IMPLANT
SUT VICRYL 4-0 PS2 18IN ABS (SUTURE) ×2 IMPLANT
SYR BULB 3OZ (MISCELLANEOUS) ×2 IMPLANT
SYR CONTROL 10ML LL (SYRINGE) IMPLANT
TOWEL OR 17X24 6PK STRL BLUE (TOWEL DISPOSABLE) ×4 IMPLANT
TUBE CONNECTING 20X1/4 (TUBING) IMPLANT
UNDERPAD 30X30 INCONTINENT (UNDERPADS AND DIAPERS) ×2 IMPLANT

## 2012-11-06 NOTE — Anesthesia Postprocedure Evaluation (Signed)
  Anesthesia Post-op Note  Patient: Angela Rosario  Procedure(s) Performed: Procedure(s): LEFT OPEN REDUCTION INTERNAL FIXATION (ORIF) DISTAL RADIUS WRIST FRACTURE (Left)  Patient Location: PACU  Anesthesia Type:GA combined with regional for post-op pain  Level of Consciousness: awake, alert , oriented and patient cooperative  Airway and Oxygen Therapy: Patient Spontanous Breathing  Post-op Pain: none  Post-op Assessment: Post-op Vital signs reviewed, Patient's Cardiovascular Status Stable, Respiratory Function Stable, Patent Airway, No signs of Nausea or vomiting and Pain level controlled  Post-op Vital Signs: Reviewed and stable  Complications: No apparent anesthesia complications

## 2012-11-06 NOTE — Anesthesia Procedure Notes (Signed)
Anesthesia Regional Block:  Axillary brachial plexus block  Pre-Anesthetic Checklist: ,, timeout performed, Correct Patient, Correct Site, Correct Laterality, Correct Procedure, Correct Position, site marked, Risks and benefits discussed,  Surgical consent,  Pre-op evaluation,  At surgeon's request and post-op pain management  Laterality: Left  Prep: chloraprep       Needles:  Injection technique: Single-shot  Needle Type: Other   (short "B" bevel needle)    Needle Gauge: 22 and 22 G    Additional Needles:  Procedures: paresthesia technique Axillary brachial plexus block  Nerve Stimulator or Paresthesia:  Response: transient median nerve paresthesia,  Response: transient radial nerve paresthesia,   Additional Responses:   Narrative:  Start time: 11/06/2012 10:52 AM End time: 11/06/2012 10:58 AM Injection made incrementally with aspirations every 5 mL.  Performed by: Personally  Anesthesiologist: Sandford Craze, MD  Additional Notes: Pt identified in Holding room.  Monitors applied. Working IV access confirmed. Sterile prep L axilla.  #22ga "B" bevel to transient median and radial paresthesiae.  Total 25cc 1.5% lidocaine with 1:200k epi and 30cc 0.5% Bupivacaine with 1:200k epi injected incrementally after negative test doses, distributed around each paresthesia.  Patient asymptomatic, VSS, no heme aspirated, tolerated well.  Sandford Craze, MD  Axillary brachial plexus block

## 2012-11-06 NOTE — Anesthesia Preprocedure Evaluation (Addendum)
Anesthesia Evaluation  Patient identified by MRN, date of birth, ID band Patient awake    Reviewed: Allergy & Precautions, H&P , NPO status , Patient's Chart, lab work & pertinent test results, reviewed documented beta blocker date and time   History of Anesthesia Complications Negative for: history of anesthetic complications  Airway Mallampati: II TM Distance: >3 FB Neck ROM: Full    Dental  (+) Caps and Dental Advisory Given   Pulmonary former smoker,  breath sounds clear to auscultation  Pulmonary exam normal       Cardiovascular hypertension, Pt. on medications and Pt. on home beta blockers Rhythm:Regular Rate:Normal  8/14 ECHO: normal LVF, EF 55-60%, valves OK   Neuro/Psych negative neurological ROS     GI/Hepatic negative GI ROS, Neg liver ROS,   Endo/Other  diabetes, Well Controlled, Type obesity  Renal/GU      Musculoskeletal   Abdominal (+) + obese,   Peds  Hematology   Anesthesia Other Findings   Reproductive/Obstetrics                         Anesthesia Physical Anesthesia Plan  ASA: II  Anesthesia Plan: General   Post-op Pain Management:    Induction: Intravenous  Airway Management Planned: LMA  Additional Equipment:   Intra-op Plan:   Post-operative Plan:   Informed Consent: I have reviewed the patients History and Physical, chart, labs and discussed the procedure including the risks, benefits and alternatives for the proposed anesthesia with the patient or authorized representative who has indicated his/her understanding and acceptance.   Dental advisory given  Plan Discussed with: CRNA and Surgeon  Anesthesia Plan Comments: (Plan routine monitors, GA- LMA OK, axillary block for post op analgesia)        Anesthesia Quick Evaluation

## 2012-11-06 NOTE — Op Note (Signed)
08/28/19

## 2012-11-06 NOTE — Brief Op Note (Signed)
11/06/2012  1:32 PM  PATIENT:  Angela Rosario  72 y.o. female  PRE-OPERATIVE DIAGNOSIS:  LEFT DISTAL RADIUS FRACTURE  POST-OPERATIVE DIAGNOSIS:  LEFT DISTAL RADIUS FRACTURE  PROCEDURE:  Procedure(s): LEFT OPEN REDUCTION INTERNAL FIXATION (ORIF) DISTAL RADIUS WRIST FRACTURE  SURGEON:  Surgeon(s): Tami Ribas, MD Nicki Reaper, MD  PHYSICIAN ASSISTANT:   ASSISTANTS: Cindee Salt, MD   ANESTHESIA:   regional and general  EBL:  Total I/O In: 1200 [I.V.:1200] Out: -   DRAINS: none   LOCAL MEDICATIONS USED:  NONE  SPECIMEN:  No Specimen  DISPOSITION OF SPECIMEN:  N/A  COUNTS:  YES  TOURNIQUET:   Total Tourniquet Time Documented: Upper Arm (Left) - 53 minutes Total: Upper Arm (Left) - 53 minutes   DICTATION: .Other Dictation: Dictation Number [id number]  PLAN OF CARE: Discharge to home after PACU

## 2012-11-06 NOTE — H&P (Signed)
Angela Rosario is an 72 y.o. female.   Chief Complaint: left distal radius fracture HPI: 72 yo lhd female who fell onto left wrist 10/30/12.  Seen at [facility] where XR revealed left distal radius fracture.  Closed reduction performed at [facility].  Followed up in office.  Reports no previous injury to wrist.    Past Medical History  Diagnosis Date  . Syncope   . Diastolic dysfunction   . Hyperlipidemia   . Episode of dizziness     Mild episodes  . Orthostatic hypotension     Some episodes  . Arthritis     oa  . Stress incontinence, female     wears pads  . AC (acromioclavicular) joint bone spurs     bone spurs in neck  . 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 Diabetes mellitus     niddm; last A1C 6.2  . Skin cancer   . Hypertension   . Wears glasses     Past Surgical History  Procedure Laterality Date  . Other surgical history      Hysterectomy  . Cholecystectomy  1992  . Abdominal hysterectomy  1985    1 ovary removed  . Total hip arthroplasty  02/23/2011    Procedure: TOTAL HIP ARTHROPLASTY;  Surgeon: Gus Rankin Aluisio;  Location: [facility];  Service: Orthopedics;  Laterality: Left;  . Pubovaginal sling  02/03/2012    Procedure: Leonides Grills;  Surgeon: Marcine Matar, MD;  Location: [facility];  Service: Urology;  Laterality: N/A;  1 HR  LYNX SLING  . Tonsillectomy    . Breast lumpectomy with needle localization and axillary sentinel lymph node bx Right 10/04/2012    Procedure: RIGHT BREAST LUMPECTOMY WITH NEEDLE LOCALIZATION AND AXILLARY SENTINEL LYMPH NODE BX;  Surgeon: Emelia Loron, MD;  Location: [facility];  Service: General;  Laterality: Right;  . Portacath placement Left 10/04/2012    Procedure: INSERTION PORT-A-CATH;  Surgeon: Emelia Loron, MD;  Location:  SUR[facility]ERY [facility];  Service: General;  Laterality: Left;    Family History  Problem Relation Age of Onset  . Breast cancer Maternal Aunt   .  Breast cancer Maternal Uncle    Social History:  reports that she quit smoking about 55 years ago. She has never used smokeless tobacco. She reports that she does not drink alcohol or use illicit drugs.  Allergies:  Allergies  Allergen Reactions  . Oxycodone Other (See Comments)    hallucinations    Medications Prior to Admission  Medication Sig Dispense Refill  . Alpha-Lipoic Acid 600 MG CAPS Take 1 each by mouth daily.      . carvedilol (COREG) 6.25 MG tablet Take 1 tablet (6.25 mg total) by mouth 2 (two) times daily with a meal.  180 tablet  3  . dexamethasone (DECADRON) 4 MG tablet Take 2 tablets (8 mg total) by mouth 2 (two) times daily with a meal. Take two times a day the day before Taxotere. Then take two times a day starting the day after chemo for 3 days.  30 tablet  1  . glipiZIDE-metformin (METAGLIP) 2.5-500 MG per tablet Take 1 tablet by mouth every morning.       Marland Kitchen HYDROcodone-acetaminophen (NORCO/VICODIN) 5-325 MG per tablet Take 1 tablet by mouth every 6 (six) hours as needed for pain.      Marland Kitchen LORazepam (ATIVAN) 0.5 MG tablet Take 1 tablet (0.5 mg total) by mouth every 6 (six)  hours as needed (Nausea or vomiting).  30 tablet  0  . lovastatin (MEVACOR) 10 MG tablet Take 10 mg by mouth at bedtime.       . Multiple Vitamin (MULTIVITAMIN) capsule Take 1 capsule by mouth daily.      Marland Kitchen tretinoin (RETIN-A) 0.025 % cream Apply topically at bedtime.      Marland Kitchen acetaminophen (TYLENOL) 500 MG tablet Take 1-2 tablets (500-1,000 mg total) by mouth every 6 (six) hours as needed.  80 tablet  0  . ondansetron (ZOFRAN) 8 MG tablet Take 1 tablet (8 mg total) by mouth 2 (two) times daily. Take two times a day starting the day after chemo for 3 days. Then take two times a day as needed for nausea or vomiting.  30 tablet  1  . prochlorperazine (COMPAZINE) 10 MG tablet Take 1 tablet (10 mg total) by mouth every 6 (six) hours as needed (Nausea or vomiting).  30 tablet  1  . UNABLE TO FIND Rx: L8015-Post  Mastectomy Camisole (Quantity: 2); L8000-Post Surgical Bras (Quantity: 6) Dx: 174.9 Right partial mastectomy  1 each  0    Results for orders placed during the hospital encounter of 11/06/12 (from the past 48 hour(s))  POCT HEMOGLOBIN-HEMACUE     Status: None   Collection Time    11/06/12  9:43 AM      Result Value Range   Hemoglobin 13.4  12.0 - 15.0 g/dL  GLUCOSE, CAPILLARY     Status: Abnormal   Collection Time    11/06/12 11:09 AM      Result Value Range   Glucose-Capillary 142 (*) 70 - 99 mg/dL    No results found.   A comprehensive review of systems was negative except for: Ears, nose, mouth, throat, and face: positive for tinnitus  Blood pressure 128/58, pulse 65, temperature 97.3 F (36.3 C), temperature source Oral, resp. rate 17, height 5\' 6"  (1.676 m), weight 186 lb 12.8 oz (84.732 kg), SpO2 100.00%.  General appearance: alert, cooperative and appears stated age Head: Normocephalic, without obvious abnormality, atraumatic Neck: supple, symmetrical, trachea midline Resp: clear to auscultation bilaterally Cardio: regular rate and rhythm GI: non tender Extremities: intact sensation and capillary refill all digits.  +epl/fpl/io.  ttp distal radius.  skin intact. Pulses: 2+ and symmetric Skin: Skin color, texture, turgor normal. No rashes or lesions Neurologic: Grossly normal Incision/Wound: na  Assessment/Plan Left comminuted intraarticular distal radius fracture.  Non operative and operative treatment options were discussed with the patient and patient wishes to proceed with operative treatment. Risks, benefits, and alternatives of surgery were discussed and the patient agrees with the plan of care.   Henritta Mutz R 11/06/2012, 11:57 AM

## 2012-11-06 NOTE — Progress Notes (Signed)
Assisted Dr. Jackson with left, axillary block. Side rails up, monitors on throughout procedure. See vital signs in flow sheet. Tolerated Procedure well. 

## 2012-11-06 NOTE — Transfer of Care (Signed)
Immediate Anesthesia Transfer of Care Note  Patient: Angela Rosario  Procedure(s) Performed: Procedure(s): LEFT OPEN REDUCTION INTERNAL FIXATION (ORIF) DISTAL RADIUS WRIST FRACTURE (Left)  Patient Location: PACU  Anesthesia Type:General and Regional  Level of Consciousness: awake  Airway & Oxygen Therapy: Patient Spontanous Breathing and Patient connected to face mask oxygen  Post-op Assessment: Report given to PACU RN and Post -op Vital signs reviewed and stable  Post vital signs: Reviewed and stable  Complications: No apparent anesthesia complications

## 2012-11-07 ENCOUNTER — Encounter (HOSPITAL_BASED_OUTPATIENT_CLINIC_OR_DEPARTMENT_OTHER): Payer: Self-pay | Admitting: Orthopedic Surgery

## 2012-11-07 NOTE — Op Note (Signed)
Angela Rosario, Angela Rosario NO.:  [id number]  MEDICAL RECORD NO.:  [id number]  LOCATION:                               FACILITY:  [facility]  PHYSICIAN:  Betha Loa, MD        DATE OF BIRTH:  May 07, 1940  DATE OF PROCEDURE:  11/06/2012 DATE OF DISCHARGE:  11/06/2012                              OPERATIVE REPORT   PREOPERATIVE DIAGNOSIS:  Left comminuted intra-articular distal radius fracture.  POSTOPERATIVE DIAGNOSIS:  Left comminuted intra-articular distal radius fracture.  PROCEDURE:  Open reduction and internal fixation, left comminuted intra- articular distal radius fracture.  SURGEON:  Betha Loa, MD  ASSISTANT:  Cindee Salt, M.D.  ANESTHESIA:  General with regional.  IV FLUIDS:  Per Anesthesia flow sheet.  ESTIMATED BLOOD LOSS:  Minimal.  COMPLICATIONS:  None.  SPECIMENS:  None.  TOURNIQUET TIME:  53 minutes.  DISPOSITION:  Stable to PACU.  INDICATIONS:  Ms. Gasparini is a 72 year old female who fell last week onto her left arm.  She had pain in her left arm.  She was seen at the ED where XR revealed a left distal radius fracture.  She was splinted and followed up in the office.  Risks, benefits, and alternatives of surgery were discussed including the risk of blood loss; infection; damage to nerves, vessels, tendons, ligaments, bone; failure of surgery; need for additional surgery; complications with wound healing; continued pain; nonunion; malunion; and stiffness.  She voiced understanding these risks and elected to proceed.  OPERATIVE COURSE:  After being identified preoperatively by myself, the patient and I agreed upon procedure and site procedure.  Surgical site was marked.  The risks, benefits, and alternatives of surgery were reviewed and she wished to proceed.  Surgical consent had been signed. She was given IV Ancef as preoperative antibiotic prophylaxis.  She was transferred to the operating room and placed on the operating table in supine  position with left upper extremity on arm board.  Regional block had been performed by Anesthesia in preoperative holding.  General anesthesia was induced in the operating room.  The left upper extremity was prepped and draped in normal sterile orthopedic fashion.  Surgical pause was performed between surgeons, anesthesia, operating staff, and all were in agreement as to the patient, procedure, and site procedure. Tourniquet at the proximal aspect of the extremity was inflated to 250 mmHg after exsanguination of the limb with Esmarch bandage.  Standard volar Sherilyn Cooter approach was used.  Bipolar electrocautery was used to obtain hemostasis.  The superficial and deep portions of the FCR tendon sheath were incised, and the FCR and FPL swept ulnarly to protect the palmar cutaneous branch of median nerve.  The pronator quadratus was released and elevated with periosteal elevator.  Brachioradialis was released.  The fracture site was identified.  It was cleared off soft tissue interposition.  It was intra-articular and there was a large volar metaphyseal fragment.  The fracture was reduced and a volar distal radial locking plate from the Acumed set was selected and secured to the bone with the guide pins.  C-arm was used in AP, lateral, and oblique projections to ensure appropriate reduction and position of hardware, which was the  case.  Standard AO drilling and measuring technique was used throughout.  A single nonlocking screw was placed in the ulnar sided hole in the shaft of the plate.  The nonlocking screw was placed in the distal ulnar hole to bring the distal radial fragment up to the plate. Remaining holes were filled with locking pegs with the exception of the radial styloid holes which were filled with locking screws.  The nonlocking screw was then exchanged for a locking peg.  The remaining 2 holes in the shaft of the plate were filled with nonlocking screws.  C- arm was used in AP,  lateral, and oblique projections to ensure appropriate reduction and position of hardware, which was the case. There was no intra-articular penetration.  The wound was copiously irrigated with sterile saline.  The wrist was placed through range of motion.  Full pronation and supination was attained.  The distal radioulnar joint was stable to shock testing in both pronation and supination.  The pronator quadratus repaired back over top of the plate using 4-0 Vicryl suture.  The 4-0 Vicryl suture was used in an inverted interrupted fashion in the subcutaneous tissues and skin was closed with 4-0 nylon in a horizontal mattress fashion.  The wound was dressed with sterile Xeroform, 4x4s, and wrapped with a Kerlix bandage.  A volar splint was placed and wrapped with Kerlix and Ace bandage.  Tourniquet was deflated at 53 minutes.  Fingertips were pink with brisk capillary refill after deflation of tourniquet.  The operative drapes were broken down.  The patient was awoken from anesthesia safely.  She was transferred back to the stretcher and taken to PACU in stable condition. I will see her back in the office in 1 week for postoperative followup. I will give her Norco 5/325, 1-2 p.o. q.6 hours p.r.n. pain, dispensed #40.  She states she is able to tolerate these.     Betha Loa, MD     KK/MEDQ  D:  11/06/2012  T:  11/07/2012  Job:  [id number]

## 2012-11-09 ENCOUNTER — Telehealth: Payer: Self-pay | Admitting: *Deleted

## 2012-11-09 NOTE — Telephone Encounter (Signed)
Pt called to inform she had broken her left arm by falling down six concrete stairs.  She was in a cast and is not in a soft cast.  She is scheduled for her 1st round of chemo on 11/19/12.  I will inform her physician team of her accident.  I told the pt that this should not interfere with her treatment, but that Dr. Welton Flakes will make that decision when she is seen on 11/19/12 prior to her chemotherapy treatment.

## 2012-11-13 ENCOUNTER — Encounter: Payer: Self-pay | Admitting: *Deleted

## 2012-11-13 ENCOUNTER — Other Ambulatory Visit: Payer: Medicare Other

## 2012-11-13 ENCOUNTER — Encounter: Payer: Self-pay | Admitting: Cardiovascular Disease

## 2012-11-13 ENCOUNTER — Other Ambulatory Visit: Payer: Self-pay | Admitting: Emergency Medicine

## 2012-11-13 MED ORDER — LIDOCAINE-PRILOCAINE 2.5-2.5 % EX CREA: TOPICAL_CREAM | CUTANEOUS | Status: DC | PRN

## 2012-11-13 NOTE — Progress Notes (Signed)
Mailed after appt letter to pt. 

## 2012-11-15 NOTE — Progress Notes (Signed)
[facility] Psychosocial Distress Screening Clinical Social Work  Clinical Social Work was referred by distress screening protocol.  The patient scored a 5 on the Psychosocial Distress Thermometer which indicates severe distress. Clinical Social Worker Intern telephoned to assess for distress and other psychosocial needs. Patient reported that she had no immediate needs.  Patient reported she had fallen and broken her arm but was making "great progress" with therapy.  Patient stated some anxiety about upcoming chemo but stated, "I guess that is to be expected".  Clinical Social Worker Intern encouraged Patient that anxiety was common but that Patient should discuss it with Patient doctor or nurse.  CSW Intern also informed Patient that counseling was available as well.    Clinical Social Worker follow up needed: no  If yes, follow up plan:   Alya Smaltz S. [facility] Clinical Social Work Intern [employer] [facility] [phone]

## 2012-11-16 ENCOUNTER — Telehealth: Payer: Self-pay | Admitting: *Deleted

## 2012-11-16 ENCOUNTER — Encounter: Payer: Self-pay | Admitting: Adult Health

## 2012-11-16 NOTE — Telephone Encounter (Signed)
error 

## 2012-11-16 NOTE — Progress Notes (Signed)
This encounter was created in error - please disregard.

## 2012-11-16 NOTE — Telephone Encounter (Signed)
Called in to [facility] pharmacy anti-nausea medications and dexamethasone.  Gave instructions for medications and EMLA cream.  Received verbal understanding.  Pt denies further needs.  Confirmed f/u appt and chemo treatment.  Contact information given.

## 2012-11-19 ENCOUNTER — Telehealth: Payer: Self-pay | Admitting: *Deleted

## 2012-11-19 ENCOUNTER — Ambulatory Visit (HOSPITAL_BASED_OUTPATIENT_CLINIC_OR_DEPARTMENT_OTHER): Payer: Medicare Other

## 2012-11-19 ENCOUNTER — Ambulatory Visit (HOSPITAL_BASED_OUTPATIENT_CLINIC_OR_DEPARTMENT_OTHER): Payer: Medicare Other | Admitting: Oncology

## 2012-11-19 ENCOUNTER — Other Ambulatory Visit (HOSPITAL_BASED_OUTPATIENT_CLINIC_OR_DEPARTMENT_OTHER): Payer: Medicare Other | Admitting: Lab

## 2012-11-19 ENCOUNTER — Other Ambulatory Visit: Payer: Self-pay | Admitting: Medical Oncology

## 2012-11-19 ENCOUNTER — Encounter: Payer: Self-pay | Admitting: Oncology

## 2012-11-19 VITALS — BP 135/75 | HR 69 | Temp 98.2°F | Resp 20 | Ht 66.0 in | Wt 186.8 lb

## 2012-11-19 DIAGNOSIS — C50119 Malignant neoplasm of central portion of unspecified female breast: Secondary | ICD-10-CM

## 2012-11-19 DIAGNOSIS — C50111 Malignant neoplasm of central portion of right female breast: Secondary | ICD-10-CM

## 2012-11-19 DIAGNOSIS — Z5112 Encounter for antineoplastic immunotherapy: Secondary | ICD-10-CM

## 2012-11-19 DIAGNOSIS — C50419 Malignant neoplasm of upper-outer quadrant of unspecified female breast: Secondary | ICD-10-CM

## 2012-11-19 LAB — COMPREHENSIVE METABOLIC PANEL (CC13)
ALT: 13 U/L (ref 0–55)
AST: 9 U/L (ref 5–34)
Alkaline Phosphatase: 97 U/L (ref 40–150)
BUN: 14.8 mg/dL (ref 7.0–26.0)
Calcium: 10.1 mg/dL (ref 8.4–10.4)
Chloride: 108 mEq/L (ref 98–109)
Creatinine: 0.8 mg/dL (ref 0.6–1.1)
Total Bilirubin: 0.51 mg/dL (ref 0.20–1.20)

## 2012-11-19 LAB — CBC WITH DIFFERENTIAL/PLATELET
BASO%: 0.7 % (ref 0.0–2.0)
Basophils Absolute: 0.2 10*3/uL — ABNORMAL HIGH (ref 0.0–0.1)
EOS%: 0 % (ref 0.0–7.0)
HCT: 39.3 % (ref 34.8–46.6)
HGB: 13 g/dL (ref 11.6–15.9)
MCH: 30.3 pg (ref 25.1–34.0)
MCV: 91.6 fL (ref 79.5–101.0)
MONO%: 5.7 % (ref 0.0–14.0)
NEUT%: 82.2 % — ABNORMAL HIGH (ref 38.4–76.8)
Platelets: 406 10*3/uL — ABNORMAL HIGH (ref 145–400)
RDW: 13.4 % (ref 11.2–14.5)

## 2012-11-19 MED ORDER — DIPHENHYDRAMINE HCL 25 MG PO CAPS
ORAL_CAPSULE | ORAL | Status: AC
  Filled 2012-11-19: qty 2

## 2012-11-19 MED ORDER — ACETAMINOPHEN 325 MG PO TABS
650.0000 mg | ORAL_TABLET | Freq: Once | ORAL | Status: AC
  Administered 2012-11-19: 650 mg via ORAL

## 2012-11-19 MED ORDER — ACETAMINOPHEN 325 MG PO TABS
ORAL_TABLET | ORAL | Status: AC
  Filled 2012-11-19: qty 2

## 2012-11-19 MED ORDER — HEPARIN SOD (PORK) LOCK FLUSH 100 UNIT/ML IV SOLN
500.0000 [IU] | Freq: Once | INTRAVENOUS | Status: AC | PRN
  Administered 2012-11-19: 500 [IU]
  Filled 2012-11-19: qty 5

## 2012-11-19 MED ORDER — SODIUM CHLORIDE 0.9 % IV SOLN
Freq: Once | INTRAVENOUS | Status: AC
  Administered 2012-11-19: 13:00:00 via INTRAVENOUS

## 2012-11-19 MED ORDER — DIPHENHYDRAMINE HCL 25 MG PO CAPS
50.0000 mg | ORAL_CAPSULE | Freq: Once | ORAL | Status: AC
  Administered 2012-11-19: 50 mg via ORAL

## 2012-11-19 MED ORDER — SODIUM CHLORIDE 0.9 % IJ SOLN
10.0000 mL | INTRAMUSCULAR | Status: DC | PRN
Start: 2012-11-19 — End: 2012-11-19
  Administered 2012-11-19: 10 mL
  Filled 2012-11-19: qty 10

## 2012-11-19 MED ORDER — SODIUM CHLORIDE 0.9 % IV SOLN
4.0000 mg/kg | Freq: Once | INTRAVENOUS | Status: AC
  Administered 2012-11-19: 357 mg via INTRAVENOUS
  Filled 2012-11-19: qty 17

## 2012-11-19 NOTE — Progress Notes (Signed)
Per Hector Shade via Dr. Welton Flakes, patient to receive Herceptin only today.  Per Sharyl Nimrod, RN for Dr. Welton Flakes, will hold on ordering flu vaccine today and will reassess next week 11/26/12 at patient's next lab and NP visit. Patient is OK with this plan and states she would rather wait until next week to receive flu vaccine.

## 2012-11-19 NOTE — Telephone Encounter (Signed)
Per staff message and POF I have scheduled appts.  JMW  

## 2012-11-19 NOTE — Telephone Encounter (Signed)
appts made and printed...td 

## 2012-11-19 NOTE — Patient Instructions (Addendum)
Proceed with herceptin only today  We will delay the Taxotere/carboplatin by 3 weeks due to the left wrist fracture  We will see you back in 1 week for herceptin only

## 2012-11-19 NOTE — Patient Instructions (Addendum)
[facility] Discharge Instructions for Patients Receiving Chemotherapy  Today you received the following chemotherapy agents Herceptin.  To help prevent nausea and vomiting after your treatment, we encourage you to take your nausea medication as prescribed.   If you develop nausea and vomiting that is not controlled by your nausea medication, call the clinic.   BELOW ARE SYMPTOMS THAT SHOULD BE REPORTED IMMEDIATELY:  *FEVER GREATER THAN 100.5 F  *CHILLS WITH OR WITHOUT FEVER  NAUSEA AND VOMITING THAT IS NOT CONTROLLED WITH YOUR NAUSEA MEDICATION  *UNUSUAL SHORTNESS OF BREATH  *UNUSUAL BRUISING OR BLEEDING  TENDERNESS IN MOUTH AND THROAT WITH OR WITHOUT PRESENCE OF ULCERS  *URINARY PROBLEMS  *BOWEL PROBLEMS  UNUSUAL RASH Items with * indicate a potential emergency and should be followed up as soon as possible.  Feel free to call the clinic you have any questions or concerns. The clinic phone number is [phone].    

## 2012-11-20 ENCOUNTER — Ambulatory Visit: Payer: Medicare Other

## 2012-11-26 ENCOUNTER — Other Ambulatory Visit (HOSPITAL_BASED_OUTPATIENT_CLINIC_OR_DEPARTMENT_OTHER): Payer: Medicare Other | Admitting: Lab

## 2012-11-26 ENCOUNTER — Ambulatory Visit (HOSPITAL_BASED_OUTPATIENT_CLINIC_OR_DEPARTMENT_OTHER): Payer: Medicare Other | Admitting: Adult Health

## 2012-11-26 ENCOUNTER — Encounter: Payer: Self-pay | Admitting: Adult Health

## 2012-11-26 ENCOUNTER — Ambulatory Visit (HOSPITAL_BASED_OUTPATIENT_CLINIC_OR_DEPARTMENT_OTHER): Payer: Medicare Other

## 2012-11-26 VITALS — BP 118/74 | HR 72 | Temp 98.4°F | Resp 20 | Ht 66.0 in | Wt 188.7 lb

## 2012-11-26 DIAGNOSIS — C50419 Malignant neoplasm of upper-outer quadrant of unspecified female breast: Secondary | ICD-10-CM

## 2012-11-26 DIAGNOSIS — Z23 Encounter for immunization: Secondary | ICD-10-CM

## 2012-11-26 DIAGNOSIS — C50111 Malignant neoplasm of central portion of right female breast: Secondary | ICD-10-CM

## 2012-11-26 DIAGNOSIS — Z170 Estrogen receptor positive status [ER+]: Secondary | ICD-10-CM

## 2012-11-26 DIAGNOSIS — Z5112 Encounter for antineoplastic immunotherapy: Secondary | ICD-10-CM

## 2012-11-26 LAB — CBC WITH DIFFERENTIAL/PLATELET
BASO%: 0.2 % (ref 0.0–2.0)
Basophils Absolute: 0 10*3/uL (ref 0.0–0.1)
EOS%: 3.4 % (ref 0.0–7.0)
Eosinophils Absolute: 0.3 10*3/uL (ref 0.0–0.5)
HCT: 37.6 % (ref 34.8–46.6)
HGB: 12.8 g/dL (ref 11.6–15.9)
LYMPH%: 27.8 % (ref 14.0–49.7)
MCH: 30.8 pg (ref 25.1–34.0)
MCHC: 34 g/dL (ref 31.5–36.0)
MCV: 90.4 fL (ref 79.5–101.0)
MONO#: 0.7 10*3/uL (ref 0.1–0.9)
MONO%: 8.4 % (ref 0.0–14.0)
NEUT#: 5.1 10*3/uL (ref 1.5–6.5)
NEUT%: 60.2 % (ref 38.4–76.8)
Platelets: 279 10*3/uL (ref 145–400)
RBC: 4.16 10*6/uL (ref 3.70–5.45)
RDW: 13.8 % (ref 11.2–14.5)
WBC: 8.5 10*3/uL (ref 3.9–10.3)
lymph#: 2.4 10*3/uL (ref 0.9–3.3)
nRBC: 0 % (ref 0–0)

## 2012-11-26 LAB — COMPREHENSIVE METABOLIC PANEL (CC13)
ALT: 15 U/L (ref 0–55)
Albumin: 3.7 g/dL (ref 3.5–5.0)
Anion Gap: 9 mEq/L (ref 3–11)
BUN: 13.5 mg/dL (ref 7.0–26.0)
CO2: 24 mEq/L (ref 22–29)
Calcium: 9.4 mg/dL (ref 8.4–10.4)
Chloride: 109 mEq/L (ref 98–109)
Glucose: 133 mg/dl (ref 70–140)
Potassium: 4.1 mEq/L (ref 3.5–5.1)
Sodium: 142 mEq/L (ref 136–145)
Total Protein: 6.3 g/dL — ABNORMAL LOW (ref 6.4–8.3)

## 2012-11-26 MED ORDER — HEPARIN SOD (PORK) LOCK FLUSH 100 UNIT/ML IV SOLN
500.0000 [IU] | Freq: Once | INTRAVENOUS | Status: AC | PRN
  Administered 2012-11-26: 500 [IU]
  Filled 2012-11-26: qty 5

## 2012-11-26 MED ORDER — SODIUM CHLORIDE 0.9 % IV SOLN
Freq: Once | INTRAVENOUS | Status: AC
  Administered 2012-11-26: 11:00:00 via INTRAVENOUS

## 2012-11-26 MED ORDER — TRASTUZUMAB CHEMO INJECTION 440 MG
2.0000 mg/kg | Freq: Once | INTRAVENOUS | Status: AC
  Administered 2012-11-26: 168 mg via INTRAVENOUS
  Filled 2012-11-26: qty 8

## 2012-11-26 MED ORDER — INFLUENZA VAC SPLIT QUAD 0.5 ML IM SUSP
0.5000 mL | INTRAMUSCULAR | Status: AC
  Administered 2012-11-26: 0.5 mL via INTRAMUSCULAR
  Filled 2012-11-26: qty 0.5

## 2012-11-26 MED ORDER — DIPHENHYDRAMINE HCL 25 MG PO CAPS
ORAL_CAPSULE | ORAL | Status: AC
  Filled 2012-11-26: qty 2

## 2012-11-26 MED ORDER — SODIUM CHLORIDE 0.9 % IJ SOLN
10.0000 mL | INTRAMUSCULAR | Status: DC | PRN
  Administered 2012-11-26: 10 mL
  Filled 2012-11-26: qty 10

## 2012-11-26 MED ORDER — ACETAMINOPHEN 325 MG PO TABS
ORAL_TABLET | ORAL | Status: AC
  Filled 2012-11-26: qty 2

## 2012-11-26 MED ORDER — ACETAMINOPHEN 325 MG PO TABS
650.0000 mg | ORAL_TABLET | Freq: Once | ORAL | Status: AC
  Administered 2012-11-26: 650 mg via ORAL

## 2012-11-26 MED ORDER — DIPHENHYDRAMINE HCL 25 MG PO CAPS
50.0000 mg | ORAL_CAPSULE | Freq: Once | ORAL | Status: AC
  Administered 2012-11-26: 50 mg via ORAL

## 2012-11-26 NOTE — Patient Instructions (Signed)
Doing well.  Proceed with Herceptin.  Please call us if you have any questions or concerns.    

## 2012-11-26 NOTE — Progress Notes (Signed)
OFFICE PROGRESS NOTE  CC**  [name], MD [address] [facility], [place]. 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     PRIOR THERAPY: 1.  Patient palpated a mass in August 2014. She went on to have a mammogram performed that showed a 7 mm mass at the 12:00 position. The ultrasound showed the mass to be 7 mm) nipple. MRI showed a 1.7 cm irregular mass. Patient had a biopsy performed that showed invasive ductal carcinoma intermediate grade tumor was ER positive PR positive HER-2/neu positive with amplification of 2.96. Ki-67 was elevated at 45%.  2.  Patient underwent lumpectomy on 10/04/12 with Dr. Dwain Sarna he removed a 1.6cm invasive grade II ductal carcinoma with lymphovascular invasion.  0/5 lymph nodes were positive.     CURRENT THERAPY:[facility] with weekly Herceptin  INTERVAL HISTORY: Angela Rosario 72 y.o. female returns for Herceptin therapy.  She did not receive chemotherapy last week because she recently fractured her left arm and had surgery.  Chemotherapy is held to allow for healing.  Patient is doing well today.  Patient had an echocardiogram on 10/02/12 that demonstrated LVEF of 55-60%.  She denies fevers, chills, nausea, vomiting, numbness, chest pain, palpitations, swelling, or any further concerns.    MEDICAL HISTORY: Past Medical History  Diagnosis Date  . Syncope   . Diastolic dysfunction   . Hyperlipidemia   . Episode of dizziness     Mild episodes  . Orthostatic hypotension     Some episodes  . Arthritis     oa  . Stress incontinence, female     wears pads  . AC (acromioclavicular) joint bone spurs     bone spurs in neck  . 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 Diabetes mellitus     niddm; last A1C 6.2  . Skin cancer   . Hypertension   . Wears glasses     ALLERGIES:  is  allergic to oxycodone.  MEDICATIONS:  Current Outpatient Prescriptions  Medication Sig Dispense Refill  . Alpha-Lipoic Acid 600 MG CAPS Take 1 each by mouth daily.      . carvedilol (COREG) 6.25 MG tablet Take 1 tablet (6.25 mg total) by mouth 2 (two) times daily with a meal.  180 tablet  3  . glipiZIDE-metformin (METAGLIP) 2.5-500 MG per tablet Take 1 tablet by mouth every morning.       Marland Kitchen HYDROcodone-acetaminophen (NORCO) 5-325 MG per tablet 1-2 tabs po q6 hours prn pain  40 tablet  0  . HYDROcodone-acetaminophen (NORCO/VICODIN) 5-325 MG per tablet Take 1 tablet by mouth every 6 (six) hours as needed for pain.      Marland Kitchen lovastatin (MEVACOR) 10 MG tablet Take 10 mg by mouth at bedtime.       . Multiple Vitamin (MULTIVITAMIN) capsule Take 1 capsule by mouth daily.      Marland Kitchen tretinoin (RETIN-A) 0.025 % cream Apply topically at bedtime.      Marland Kitchen UNABLE TO FIND Rx: L8015-Post Mastectomy Camisole (Quantity: 2); L8000-Post Surgical Bras (Quantity: 6) Dx: 174.9 Right partial mastectomy  1 each  0  . dexamethasone (DECADRON) 4 MG tablet Take 2 tablets (8 mg total) by mouth 2 (two) times daily with a meal. Take two times a day the day before Taxotere. Then take two times a day starting the day after chemo for 3  days.  30 tablet  1  . lidocaine-prilocaine (EMLA) cream Apply topically as needed.  30 g  1  . LORazepam (ATIVAN) 0.5 MG tablet Take 1 tablet (0.5 mg total) by mouth every 6 (six) hours as needed (Nausea or vomiting).  30 tablet  0  . ondansetron (ZOFRAN) 8 MG tablet Take 1 tablet (8 mg total) by mouth 2 (two) times daily. Take two times a day starting the day after chemo for 3 days. Then take two times a day as needed for nausea or vomiting.  30 tablet  1  . prochlorperazine (COMPAZINE) 10 MG tablet Take 1 tablet (10 mg total) by mouth every 6 (six) hours as needed (Nausea or vomiting).  30 tablet  1   Current Facility-Administered Medications  Medication Dose Route Frequency Provider Last Rate Last  Dose  . [START ON 11/27/2012] influenza vac split quadrivalent PF (FLUARIX) injection 0.5 mL  0.5 mL Intramuscular Tomorrow-1000 Augustin Schooling, NP       Facility-Administered Medications Ordered in Other Visits  Medication Dose Route Frequency Provider Last Rate Last Dose  . gentamicin (GARAMYCIN) 160 mg in dextrose 5 % 50 mL IVPB  160 mg Intravenous 30 min Pre-Op Marcine Matar, MD        SURGICAL HISTORY:  Past Surgical History  Procedure Laterality Date  . Other surgical history      Hysterectomy  . Cholecystectomy  1992  . Abdominal hysterectomy  1985    1 ovary removed  . Total hip arthroplasty  02/23/2011    Procedure: TOTAL HIP ARTHROPLASTY;  Surgeon: Gus Rankin Aluisio;  Location: [facility];  Service: Orthopedics;  Laterality: Left;  . Pubovaginal sling  02/03/2012    Procedure: Leonides Grills;  Surgeon: Marcine Matar, MD;  Location: [facility];  Service: Urology;  Laterality: N/A;  1 HR  LYNX SLING  . Tonsillectomy    . Breast lumpectomy with needle localization and axillary sentinel lymph node bx Right 10/04/2012    Procedure: RIGHT BREAST LUMPECTOMY WITH NEEDLE LOCALIZATION AND AXILLARY SENTINEL LYMPH NODE BX;  Surgeon: Emelia Loron, MD;  Location: [facility];  Service: General;  Laterality: Right;  . Portacath placement Left 10/04/2012    Procedure: INSERTION PORT-A-CATH;  Surgeon: Emelia Loron, MD;  Location: [facility];  Service: General;  Laterality: Left;  . Orif wrist fracture Left 11/06/2012    Procedure: LEFT OPEN REDUCTION INTERNAL FIXATION (ORIF) DISTAL RADIUS WRIST FRACTURE;  Surgeon: Tami Ribas, MD;  Location: [facility];  Service: Orthopedics;  Laterality: Left;    REVIEW OF SYSTEMS:  A 10 point review of systems was conducted and is otherwise negative except for what is noted above.    PHYSICAL EXAMINATION: Blood pressure 118/74, pulse 72, temperature 98.4 F (36.9 C), temperature  source Oral, resp. rate 20, height 5\' 6"  (1.676 m), weight 188 lb 11.2 oz (85.594 kg). Body mass index is 30.47 kg/(m^2). General: Patient is a well appearing female in no acute distress HEENT: PERRLA, sclerae anicteric no conjunctival pallor, MMM Neck: supple, no palpable adenopathy Lungs: clear to auscultation bilaterally, no wheezes, rhonchi, or rales Cardiovascular: regular rate rhythm, S1, S2, no murmurs, rubs or gallops Abdomen: Soft, non-tender, non-distended, normoactive bowel sounds, no HSM Extremities: warm and well perfused, no clubbing, cyanosis, or edema Skin: No rashes or lesions Neuro: Non-focal ECOG PERFORMANCE STATUS: 1 - Symptomatic but completely ambulatory      LABORATORY DATA: Lab Results  Component Value Date  WBC 8.5 11/26/2012   HGB 12.8 11/26/2012   HCT 37.6 11/26/2012   MCV 90.4 11/26/2012   PLT 279 11/26/2012      Chemistry      Component Value Date/Time   NA 142 11/19/2012 1038   NA 135 11/01/2012 1350   K 3.9 11/19/2012 1038   K 4.2 11/01/2012 1350   CL 96 11/01/2012 1350   CO2 24 11/19/2012 1038   CO2 26 11/01/2012 1350   BUN 14.8 11/19/2012 1038   BUN 10 11/01/2012 1350   CREATININE 0.8 11/19/2012 1038   CREATININE 0.64 11/01/2012 1350      Component Value Date/Time   CALCIUM 10.1 11/19/2012 1038   CALCIUM 9.7 11/01/2012 1350   ALKPHOS 97 11/19/2012 1038   ALKPHOS 77 02/17/2011 1045   AST 9 11/19/2012 1038   AST 26 02/17/2011 1045   ALT 13 11/19/2012 1038   ALT 36* 02/17/2011 1045   BILITOT 0.51 11/19/2012 1038   BILITOT 0.5 02/17/2011 1045       RADIOGRAPHIC STUDIES:  Dg Forearm Left  10/30/2012   CLINICAL DATA:  Fall, forearm pain  EXAM: LEFT FOREARM - 2 VIEW  COMPARISON:  None.  FINDINGS: Two views of left forearm submitted. There is nondisplaced minimal impacted fracture in distal left radial metaphysis.  IMPRESSION: Nondisplaced minimally impacted fracture in distal left radial metaphysis.   Electronically Signed   By: Natasha Mead   On: 10/30/2012  18:20    ASSESSMENT: #1 palpable right breast mass at the 12:00 position measuring 7 mm ultrasound confirmed this mass close to the nipple. MRI revealed the mass to be irregular and measuring 1.7 cm not involving the skin. Biopsy revealed invasive ductal carcinoma intermediate grade ER positive PR positive HER-2/neu positive with a Ki-67 of 45%.   #2.  Patient underwent lumpectomy on 10/04/12 with Dr. Dwain Sarna he removed a 1.6cm invasive grade II ductal carcinoma with lymphovascular invasion.  0/5 lymph nodes were positive.   #3 Patient was planned to start [facility] with weekly herceptin however she fell and sustained a left arm fracture and had to undergo surgery on 11/06/12.  She continues to heal.  She started weekly herceptin on 11/19/12. She will start [facility] on 10/27.   PLAN:  1.  Doing well.  I reviewed her labs in detail.  Patient will proceed with Herceptin today.  I have also ordered a flu vaccine at her request.    2.  She will return in one week with labs, an appointment, and Herceptin only.     All questions were answered. The patient knows to call the clinic with any problems, questions or concerns. We can certainly see the patient much sooner if necessary.  I spent 25 minutes counseling the patient face to face. The total time spent in the appointment was 30 minutes.    Illa Level, NP Medical Oncology [phone] 11/26/2012, 10:53 AM

## 2012-11-26 NOTE — Patient Instructions (Addendum)
[facility] Discharge Instructions for Patients Receiving Chemotherapy  Today you received the following chemotherapy agents Herceptin To help prevent nausea and vomiting after your treatment, we encourage you to take your nausea medication as prescribed.If you develop nausea and vomiting that is not controlled by your nausea medication, call the clinic.   BELOW ARE SYMPTOMS THAT SHOULD BE REPORTED IMMEDIATELY:  *FEVER GREATER THAN 100.5 F  *CHILLS WITH OR WITHOUT FEVER  NAUSEA AND VOMITING THAT IS NOT CONTROLLED WITH YOUR NAUSEA MEDICATION  *UNUSUAL SHORTNESS OF BREATH  *UNUSUAL BRUISING OR BLEEDING  TENDERNESS IN MOUTH AND THROAT WITH OR WITHOUT PRESENCE OF ULCERS  *URINARY PROBLEMS  *BOWEL PROBLEMS  UNUSUAL RASH Items with * indicate a potential emergency and should be followed up as soon as possible.  Feel free to call the clinic you have any questions or concerns. The clinic phone number is [phone].   Influenza Virus Vaccine injection (Fluarix) What is this medicine? INFLUENZA VIRUS VACCINE (in floo EN zuh VAHY ruhs vak SEEN) helps to reduce the risk of getting influenza also known as the flu. This medicine may be used for other purposes; ask your health care provider or pharmacist if you have questions. What should I tell my health care provider before I take this medicine? They need to know if you have any of these conditions: -bleeding disorder like hemophilia -fever or infection -Guillain-Barre syndrome or other neurological problems -immune system problems -infection with the human immunodeficiency virus (HIV) or AIDS -low blood platelet counts -multiple sclerosis -an unusual or allergic reaction to influenza virus vaccine, eggs, chicken proteins, latex, gentamicin, other medicines, foods, dyes or preservatives -pregnant or trying to get pregnant -breast-feeding How should I use this medicine? This vaccine is for injection into a muscle.  It is given by a health care professional. A copy of Vaccine Information Statements will be given before each vaccination. Read this sheet carefully each time. The sheet may change frequently. Talk to your pediatrician regarding the use of this medicine in children. Special care may be needed. Overdosage: If you think you have taken too much of this medicine contact a poison control center or emergency room at once. NOTE: This medicine is only for you. Do not share this medicine with others. What if I miss a dose? This does not apply. What may interact with this medicine? -chemotherapy or radiation therapy -medicines that lower your immune system like etanercept, anakinra, infliximab, and adalimumab -medicines that treat or prevent blood clots like warfarin -phenytoin -steroid medicines like prednisone or cortisone -theophylline -vaccines This list may not describe all possible interactions. Give your health care provider a list of all the medicines, herbs, non-prescription drugs, or dietary supplements you use. Also tell them if you smoke, drink alcohol, or use illegal drugs. Some items may interact with your medicine. What should I watch for while using this medicine? Report any side effects that do not go away within 3 days to your doctor or health care professional. Call your health care provider if any unusual symptoms occur within 6 weeks of receiving this vaccine. You may still catch the flu, but the illness is not usually as bad. You cannot get the flu from the vaccine. The vaccine will not protect against colds or other illnesses that may cause fever. The vaccine is needed every year. What side effects may I notice from receiving this medicine? Side effects that you should report to your doctor or health care professional as soon as possible: -  allergic reactions like skin rash, itching or hives, swelling of the face, lips, or tongue Side effects that usually do not require medical  attention (report to your doctor or health care professional if they continue or are bothersome): -fever -headache -muscle aches and pains -pain, tenderness, redness, or swelling at site where injected -weak or tired This list may not describe all possible side effects. Call your doctor for medical advice about side effects. You may report side effects to FDA at 1-800-FDA-1088. Where should I keep my medicine? This vaccine is only given in a clinic, pharmacy, doctor's office, or other health care setting and will not be stored at home. NOTE: This sheet is a summary. It may not cover all possible information. If you have questions about this medicine, talk to your doctor, pharmacist, or health care provider.  2013, [employer]. (08/29/2007 9:30:40 AM)

## 2012-11-27 ENCOUNTER — Encounter: Payer: Self-pay | Admitting: Cardiovascular Disease

## 2012-12-03 ENCOUNTER — Encounter: Payer: Self-pay | Admitting: Adult Health

## 2012-12-03 ENCOUNTER — Other Ambulatory Visit: Payer: Self-pay | Admitting: Oncology

## 2012-12-03 ENCOUNTER — Ambulatory Visit (HOSPITAL_BASED_OUTPATIENT_CLINIC_OR_DEPARTMENT_OTHER): Payer: Medicare Other | Admitting: Adult Health

## 2012-12-03 ENCOUNTER — Ambulatory Visit (HOSPITAL_BASED_OUTPATIENT_CLINIC_OR_DEPARTMENT_OTHER): Payer: Medicare Other

## 2012-12-03 ENCOUNTER — Other Ambulatory Visit (HOSPITAL_BASED_OUTPATIENT_CLINIC_OR_DEPARTMENT_OTHER): Payer: Medicare Other | Admitting: Lab

## 2012-12-03 VITALS — BP 123/77 | HR 75 | Temp 97.7°F | Resp 18 | Ht 66.0 in | Wt 188.8 lb

## 2012-12-03 DIAGNOSIS — C50119 Malignant neoplasm of central portion of unspecified female breast: Secondary | ICD-10-CM

## 2012-12-03 DIAGNOSIS — Z5112 Encounter for antineoplastic immunotherapy: Secondary | ICD-10-CM

## 2012-12-03 DIAGNOSIS — C50111 Malignant neoplasm of central portion of right female breast: Secondary | ICD-10-CM

## 2012-12-03 DIAGNOSIS — Z170 Estrogen receptor positive status [ER+]: Secondary | ICD-10-CM

## 2012-12-03 LAB — COMPREHENSIVE METABOLIC PANEL (CC13)
ALT: 15 U/L (ref 0–55)
AST: 16 U/L (ref 5–34)
Anion Gap: 10 mEq/L (ref 3–11)
CO2: 24 mEq/L (ref 22–29)
Calcium: 9.7 mg/dL (ref 8.4–10.4)
Chloride: 109 mEq/L (ref 98–109)
Potassium: 3.8 mEq/L (ref 3.5–5.1)
Sodium: 143 mEq/L (ref 136–145)
Total Bilirubin: 0.54 mg/dL (ref 0.20–1.20)
Total Protein: 6.6 g/dL (ref 6.4–8.3)

## 2012-12-03 LAB — CBC WITH DIFFERENTIAL/PLATELET
BASO%: 0.4 % (ref 0.0–2.0)
Eosinophils Absolute: 0.2 10*3/uL (ref 0.0–0.5)
MCHC: 34 g/dL (ref 31.5–36.0)
MONO#: 0.6 10*3/uL (ref 0.1–0.9)
NEUT#: 4.9 10*3/uL (ref 1.5–6.5)
RBC: 4.16 10*6/uL (ref 3.70–5.45)
RDW: 14 % (ref 11.2–14.5)
WBC: 8.2 10*3/uL (ref 3.9–10.3)
nRBC: 0 % (ref 0–0)

## 2012-12-03 MED ORDER — SODIUM CHLORIDE 0.9 % IJ SOLN
10.0000 mL | INTRAMUSCULAR | Status: DC | PRN
  Administered 2012-12-03: 10 mL
  Filled 2012-12-03: qty 10

## 2012-12-03 MED ORDER — TRASTUZUMAB CHEMO INJECTION 440 MG
2.0000 mg/kg | Freq: Once | INTRAVENOUS | Status: AC
  Administered 2012-12-03: 168 mg via INTRAVENOUS
  Filled 2012-12-03: qty 8

## 2012-12-03 MED ORDER — SODIUM CHLORIDE 0.9 % IV SOLN
Freq: Once | INTRAVENOUS | Status: AC
  Administered 2012-12-03: 12:00:00 via INTRAVENOUS

## 2012-12-03 MED ORDER — DIPHENHYDRAMINE HCL 25 MG PO CAPS
ORAL_CAPSULE | ORAL | Status: AC
  Filled 2012-12-03: qty 2

## 2012-12-03 MED ORDER — HEPARIN SOD (PORK) LOCK FLUSH 100 UNIT/ML IV SOLN
500.0000 [IU] | Freq: Once | INTRAVENOUS | Status: AC | PRN
  Administered 2012-12-03: 500 [IU]
  Filled 2012-12-03: qty 5

## 2012-12-03 MED ORDER — DIPHENHYDRAMINE HCL 25 MG PO CAPS
50.0000 mg | ORAL_CAPSULE | Freq: Once | ORAL | Status: AC
  Administered 2012-12-03: 50 mg via ORAL

## 2012-12-03 MED ORDER — ACETAMINOPHEN 325 MG PO TABS
ORAL_TABLET | ORAL | Status: AC
  Filled 2012-12-03: qty 2

## 2012-12-03 MED ORDER — ACETAMINOPHEN 325 MG PO TABS
650.0000 mg | ORAL_TABLET | Freq: Once | ORAL | Status: AC
  Administered 2012-12-03: 650 mg via ORAL

## 2012-12-03 NOTE — Patient Instructions (Signed)
[facility] Discharge Instructions for Patients Receiving Chemotherapy  Today you received the following chemotherapy agent: Herceptin   To help prevent nausea and vomiting after your treatment, we encourage you to take your nausea medication as prescribed.    If you develop nausea and vomiting that is not controlled by your nausea medication, call the clinic.   BELOW ARE SYMPTOMS THAT SHOULD BE REPORTED IMMEDIATELY:  *FEVER GREATER THAN 100.5 F  *CHILLS WITH OR WITHOUT FEVER  NAUSEA AND VOMITING THAT IS NOT CONTROLLED WITH YOUR NAUSEA MEDICATION  *UNUSUAL SHORTNESS OF BREATH  *UNUSUAL BRUISING OR BLEEDING  TENDERNESS IN MOUTH AND THROAT WITH OR WITHOUT PRESENCE OF ULCERS  *URINARY PROBLEMS  *BOWEL PROBLEMS  UNUSUAL RASH Items with * indicate a potential emergency and should be followed up as soon as possible.  Feel free to call the clinic you have any questions or concerns. The clinic phone number is [phone].    

## 2012-12-03 NOTE — Progress Notes (Signed)
OFFICE PROGRESS NOTE  CC**  [name], MD [address] [facility], [place]. 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     PRIOR THERAPY: 1.  Patient palpated a mass in August 2014. She went on to have a mammogram performed that showed a 7 mm mass at the 12:00 position. The ultrasound showed the mass to be 7 mm) nipple. MRI showed a 1.7 cm irregular mass. Patient had a biopsy performed that showed invasive ductal carcinoma intermediate grade tumor was ER positive PR positive HER-2/neu positive with amplification of 2.96. Ki-67 was elevated at 45%.  2.  Patient underwent lumpectomy on 10/04/12 with Dr. Dwain Sarna he removed a 1.6cm invasive grade II ductal carcinoma with lymphovascular invasion.  0/5 lymph nodes were positive.   3.  Patient was supposed to start [facility] therapy 11/19/12, however she underwent surgery on her arm for a fracture due to fall on 11/06/12, therefore, she received three weeks of herceptin only starting on 10/6, with [facility]  Therapy will begin on 10/27.  CURRENT THERAPY:[facility] with weekly Herceptin  INTERVAL HISTORY: Angela Rosario 72 y.o. female returns prior to her weekly herceptin.  Her arm continues to heal very nicely.  She denies fevers, chills, chest pain, shortness of breath, or any further questions or concerns.  A 10 point ROS is neg.   MEDICAL HISTORY: Past Medical History  Diagnosis Date  . Syncope   . Diastolic dysfunction   . Hyperlipidemia   . Episode of dizziness     Mild episodes  . Orthostatic hypotension     Some episodes  . Arthritis     oa  . Stress incontinence, female     wears pads  . AC (acromioclavicular) joint bone spurs     bone spurs in neck  . 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 Diabetes mellitus     niddm; last A1C 6.2  . Skin cancer   .  Hypertension   . Wears glasses     ALLERGIES:  is allergic to oxycodone.  MEDICATIONS:  Current Outpatient Prescriptions  Medication Sig Dispense Refill  . Alpha-Lipoic Acid 600 MG CAPS Take 1 each by mouth daily.      . carvedilol (COREG) 6.25 MG tablet Take 1 tablet (6.25 mg total) by mouth 2 (two) times daily with a meal.  180 tablet  3  . glipiZIDE-metformin (METAGLIP) 2.5-500 MG per tablet Take 1 tablet by mouth every morning.       Marland Kitchen HYDROcodone-acetaminophen (NORCO) 5-325 MG per tablet 1-2 tabs po q6 hours prn pain  40 tablet  0  . lovastatin (MEVACOR) 10 MG tablet Take 10 mg by mouth at bedtime.       . Multiple Vitamin (MULTIVITAMIN) capsule Take 1 capsule by mouth daily.      Marland Kitchen tretinoin (RETIN-A) 0.025 % cream Apply topically at bedtime.      Marland Kitchen dexamethasone (DECADRON) 4 MG tablet Take 2 tablets (8 mg total) by mouth 2 (two) times daily with a meal. Take two times a day the day before Taxotere. Then take two times a day starting the day after chemo for 3 days.  30 tablet  1  . HYDROcodone-acetaminophen (NORCO/VICODIN) 5-325 MG per tablet Take 1 tablet by mouth every 6 (six) hours as needed for pain.      Marland Kitchen lidocaine-prilocaine (EMLA) cream  Apply topically as needed.  30 g  1  . LORazepam (ATIVAN) 0.5 MG tablet Take 1 tablet (0.5 mg total) by mouth every 6 (six) hours as needed (Nausea or vomiting).  30 tablet  0  . ondansetron (ZOFRAN) 8 MG tablet Take 1 tablet (8 mg total) by mouth 2 (two) times daily. Take two times a day starting the day after chemo for 3 days. Then take two times a day as needed for nausea or vomiting.  30 tablet  1  . prochlorperazine (COMPAZINE) 10 MG tablet Take 1 tablet (10 mg total) by mouth every 6 (six) hours as needed (Nausea or vomiting).  30 tablet  1  . UNABLE TO FIND Rx: L8015-Post Mastectomy Camisole (Quantity: 2); L8000-Post Surgical Bras (Quantity: 6) Dx: 174.9 Right partial mastectomy  1 each  0   No current facility-administered medications  for this visit.   Facility-Administered Medications Ordered in Other Visits  Medication Dose Route Frequency Provider Last Rate Last Dose  . gentamicin (GARAMYCIN) 160 mg in dextrose 5 % 50 mL IVPB  160 mg Intravenous 30 min Pre-Op Marcine Matar, MD        SURGICAL HISTORY:  Past Surgical History  Procedure Laterality Date  . Other surgical history      Hysterectomy  . Cholecystectomy  1992  . Abdominal hysterectomy  1985    1 ovary removed  . Total hip arthroplasty  02/23/2011    Procedure: TOTAL HIP ARTHROPLASTY;  Surgeon: Gus Rankin Aluisio;  Location: [facility];  Service: Orthopedics;  Laterality: Left;  . Pubovaginal sling  02/03/2012    Procedure: Leonides Grills;  Surgeon: Marcine Matar, MD;  Location: [facility];  Service: Urology;  Laterality: N/A;  1 HR  LYNX SLING  . Tonsillectomy    . Breast lumpectomy with needle localization and axillary sentinel lymph node bx Right 10/04/2012    Procedure: RIGHT BREAST LUMPECTOMY WITH NEEDLE LOCALIZATION AND AXILLARY SENTINEL LYMPH NODE BX;  Surgeon: Emelia Loron, MD;  Location: [facility];  Service: General;  Laterality: Right;  . Portacath placement Left 10/04/2012    Procedure: INSERTION PORT-A-CATH;  Surgeon: Emelia Loron, MD;  Location: [facility];  Service: General;  Laterality: Left;  . Orif wrist fracture Left 11/06/2012    Procedure: LEFT OPEN REDUCTION INTERNAL FIXATION (ORIF) DISTAL RADIUS WRIST FRACTURE;  Surgeon: Tami Ribas, MD;  Location: [facility];  Service: Orthopedics;  Laterality: Left;    REVIEW OF SYSTEMS:  A 10 point review of systems was conducted and is otherwise negative except for what is noted above.    PHYSICAL EXAMINATION: Blood pressure 123/77, pulse 75, temperature 97.7 F (36.5 C), temperature source Oral, resp. rate 18, height 5\' 6"  (1.676 m), weight 188 lb 12.8 oz (85.639 kg). Body mass index is 30.49 kg/(m^2). General: Patient  is a well appearing female in no acute distress HEENT: PERRLA, sclerae anicteric no conjunctival pallor, MMM Neck: supple, no palpable adenopathy Lungs: clear to auscultation bilaterally, no wheezes, rhonchi, or rales Cardiovascular: regular rate rhythm, S1, S2, no murmurs, rubs or gallops Abdomen: Soft, non-tender, non-distended, normoactive bowel sounds, no HSM Extremities: warm and well perfused, no clubbing, cyanosis, or edema, left wrist in brace Skin: No rashes or lesions Neuro: Non-focal ECOG PERFORMANCE STATUS: 1 - Symptomatic but completely ambulatory      LABORATORY DATA: Lab Results  Component Value Date   WBC 8.2 12/03/2012   HGB 12.8 12/03/2012   HCT 37.7 12/03/2012  MCV 90.6 12/03/2012   PLT 253 12/03/2012      Chemistry      Component Value Date/Time   NA 142 11/26/2012 0929   NA 135 11/01/2012 1350   K 4.1 11/26/2012 0929   K 4.2 11/01/2012 1350   CL 96 11/01/2012 1350   CO2 24 11/26/2012 0929   CO2 26 11/01/2012 1350   BUN 13.5 11/26/2012 0929   BUN 10 11/01/2012 1350   CREATININE 0.7 11/26/2012 0929   CREATININE 0.64 11/01/2012 1350      Component Value Date/Time   CALCIUM 9.4 11/26/2012 0929   CALCIUM 9.7 11/01/2012 1350   ALKPHOS 91 11/26/2012 0929   ALKPHOS 77 02/17/2011 1045   AST 13 11/26/2012 0929   AST 26 02/17/2011 1045   ALT 15 11/26/2012 0929   ALT 36* 02/17/2011 1045   BILITOT 0.42 11/26/2012 0929   BILITOT 0.5 02/17/2011 1045       RADIOGRAPHIC STUDIES:  Dg Forearm Left  10/30/2012   CLINICAL DATA:  Fall, forearm pain  EXAM: LEFT FOREARM - 2 VIEW  COMPARISON:  None.  FINDINGS: Two views of left forearm submitted. There is nondisplaced minimal impacted fracture in distal left radial metaphysis.  IMPRESSION: Nondisplaced minimally impacted fracture in distal left radial metaphysis.   Electronically Signed   By: Natasha Mead   On: 10/30/2012 18:20    ASSESSMENT: #1 palpable right breast mass at the 12:00 position measuring 7 mm ultrasound  confirmed this mass close to the nipple. MRI revealed the mass to be irregular and measuring 1.7 cm not involving the skin. Biopsy revealed invasive ductal carcinoma intermediate grade ER positive PR positive HER-2/neu positive with a Ki-67 of 45%.   #2.  Patient underwent lumpectomy on 10/04/12 with Dr. Dwain Sarna he removed a 1.6cm invasive grade II ductal carcinoma with lymphovascular invasion.  0/5 lymph nodes were positive.   #3 Patient was planned to start [facility] with weekly herceptin however she fell and sustained a left arm fracture and had to undergo surgery on 11/06/12.  She continues to heal.  She started weekly herceptin on 11/19/12. She will start [facility] on 10/27.   PLAN:  1.  Doing well.  I reviewed her labs with her in detail.  We discussed Herceptin treatment and its necessity and adverse effects.  We also discussed chemotherapy and its effects.  She will proceed with Herceptin only today.    2.  I reviewed her antiemetic regimen with her in detail.  We discussed all of her medications and how she should take them according to the treatment plan.  She verbalized understanding.    3.  She will return in one week with labs, an appointment, and [facility] chemotherapy.     All questions were answered. The patient knows to call the clinic with any problems, questions or concerns. We can certainly see the patient much sooner if necessary.  I spent 25 minutes counseling the patient face to face. The total time spent in the appointment was 30 minutes.    Illa Level, NP Medical Oncology [facility] [phone] 12/03/2012, 10:38 AM

## 2012-12-03 NOTE — Progress Notes (Signed)
OFFICE PROGRESS NOTE  CC**  [name], MD [address] [facility], [place]. [place] [address] Dr. Emelia Loron  Dr. Lonie Peak  DIAGNOSIS: 72 year old female with new diagnosis of stage I invasive ductal carcinoma. Patient is seen in the multidisciplinary breast clinic for discussion of treatment options.   STAGE:  Cancer of central portion of female breast  Primary site: Breast (Right)  Staging method: AJCC 7th Edition  Clinical: Stage IA (T1c, N0, cM0)  Summary: Stage IA (T1c, N0, cM0)  PRIOR THERAPY: #1Patient palpated a mass he weeks ago. She went on to have a mammogram performed that showed a 7 mm mass at the 12:00 position. The ultrasound showed the mass to be 7 mm) nipple. MRI showed a 1.7 cm irregular mass. Patient had a biopsy performed that showed invasive ductal carcinoma intermediate grade tumor was ER positive PR positive HER-2/neu positive with amplification of 2.96. Ki-67 was elevated at 45%  #2 patient underwent a lumpectomy with a sentinel lymph node biopsy that revealed 1.6 cm invasive ductal carcinoma ER positive PR positive HER-2/neu positive with elevated Ki-67. Sentinel nodes were negative for metastatic disease. Clinical staging and pathologic staging stage I (T1 C. N0)  #3 patient is recommended adjuvant chemotherapy consisting of Taxotere carboplatinum and Herceptin. We will plan on starting this on 11/19/2012. However patient wants to hold this since she did fracture her left wrist. She is willing to undergo treatment with Herceptin only. And she will have her Taxotere and carboplatinum infused in about 3 weeks' time  CURRENT THERAPY:patient will proceed with Herceptin alone  INTERVAL HISTORY: Angela Rosario 72 y.o. female returns for followup visit. Her course was complicated by development of a wrist fracture. That has required significant delays in her treatment. Today she feels well but she does not want chemotherapy  but she will proceed with getting Herceptin alone. She denies any nausea vomiting fevers chills night sweats headaches shortness of breath chest pains palpitations she has no myalgias and arthralgias. She does have pain in her breasts. Remainder of the 10 point review of systems is negative.  MEDICAL HISTORY: Past Medical History  Diagnosis Date  . Syncope   . Diastolic dysfunction   . Hyperlipidemia   . Episode of dizziness     Mild episodes  . Orthostatic hypotension     Some episodes  . Arthritis     oa  . Stress incontinence, female     wears pads  . AC (acromioclavicular) joint bone spurs     bone spurs in neck  . 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 Diabetes mellitus     niddm; last A1C 6.2  . Skin cancer   . Hypertension   . Wears glasses     ALLERGIES:  is allergic to oxycodone.  MEDICATIONS:  Current Outpatient Prescriptions  Medication Sig Dispense Refill  . Alpha-Lipoic Acid 600 MG CAPS Take 1 each by mouth daily.      . carvedilol (COREG) 6.25 MG tablet Take 1 tablet (6.25 mg total) by mouth 2 (two) times daily with a meal.  180 tablet  3  . dexamethasone (DECADRON) 4 MG tablet Take 2 tablets (8 mg total) by mouth 2 (two) times daily with a meal. Take two times a day the day before Taxotere. Then take two times a day starting the day after chemo for 3 days.  30 tablet  1  . glipiZIDE-metformin (METAGLIP) 2.5-500 MG per tablet Take  1 tablet by mouth every morning.       Marland Kitchen HYDROcodone-acetaminophen (NORCO) 5-325 MG per tablet 1-2 tabs po q6 hours prn pain  40 tablet  0  . HYDROcodone-acetaminophen (NORCO/VICODIN) 5-325 MG per tablet Take 1 tablet by mouth every 6 (six) hours as needed for pain.      Marland Kitchen lidocaine-prilocaine (EMLA) cream Apply topically as needed.  30 g  1  . LORazepam (ATIVAN) 0.5 MG tablet Take 1 tablet (0.5 mg total) by mouth every 6 (six) hours as needed (Nausea or vomiting).  30 tablet  0  . lovastatin (MEVACOR) 10 MG tablet  Take 10 mg by mouth at bedtime.       . Multiple Vitamin (MULTIVITAMIN) capsule Take 1 capsule by mouth daily.      . ondansetron (ZOFRAN) 8 MG tablet Take 1 tablet (8 mg total) by mouth 2 (two) times daily. Take two times a day starting the day after chemo for 3 days. Then take two times a day as needed for nausea or vomiting.  30 tablet  1  . prochlorperazine (COMPAZINE) 10 MG tablet Take 1 tablet (10 mg total) by mouth every 6 (six) hours as needed (Nausea or vomiting).  30 tablet  1  . tretinoin (RETIN-A) 0.025 % cream Apply topically at bedtime.      Marland Kitchen UNABLE TO FIND Rx: L8015-Post Mastectomy Camisole (Quantity: 2); L8000-Post Surgical Bras (Quantity: 6) Dx: 174.9 Right partial mastectomy  1 each  0   No current facility-administered medications for this visit.   Facility-Administered Medications Ordered in Other Visits  Medication Dose Route Frequency Provider Last Rate Last Dose  . gentamicin (GARAMYCIN) 160 mg in dextrose 5 % 50 mL IVPB  160 mg Intravenous 30 min Pre-Op Marcine Matar, MD        SURGICAL HISTORY:  Past Surgical History  Procedure Laterality Date  . Other surgical history      Hysterectomy  . Cholecystectomy  1992  . Abdominal hysterectomy  1985    1 ovary removed  . Total hip arthroplasty  02/23/2011    Procedure: TOTAL HIP ARTHROPLASTY;  Surgeon: Gus Rankin Aluisio;  Location: [facility];  Service: Orthopedics;  Laterality: Left;  . Pubovaginal sling  02/03/2012    Procedure: Leonides Grills;  Surgeon: Marcine Matar, MD;  Location: [facility];  Service: Urology;  Laterality: N/A;  1 HR  LYNX SLING  . Tonsillectomy    . Breast lumpectomy with needle localization and axillary sentinel lymph node bx Right 10/04/2012    Procedure: RIGHT BREAST LUMPECTOMY WITH NEEDLE LOCALIZATION AND AXILLARY SENTINEL LYMPH NODE BX;  Surgeon: Emelia Loron, MD;  Location: [facility];  Service: General;  Laterality: Right;  . Portacath placement  Left 10/04/2012    Procedure: INSERTION PORT-A-CATH;  Surgeon: Emelia Loron, MD;  Location:  SUR[facility]ERY C[facility];  Service: General;  Laterality: Left;  . Orif wrist fracture Left 11/06/2012    Procedure: LEFT OPEN REDUCTION INTERNAL FIXATION (ORIF) DISTAL RADIUS WRIST FRACTURE;  Surgeon: Tami Ribas, MD;  Location:  SURGERY CENTER;  Service: Orthopedics;  Laterality: Left;    REVIEW OF SYSTEMS:  Pertinent items are noted in HPI.   HEALTH MAINTENANCE:   PHYSICAL EXAMINATION: Blood pressure 135/75, pulse 69, temperature 98.2 F (36.8 C), temperature source Oral, resp. rate 20, height 5\' 6"  (1.676 m), weight 186 lb 12.8 oz (84.732 kg). Body mass index is 30.16 kg/(m^2). ECOG PERFORMANCE STATUS: 1 - Symptomatic but completely ambulatory  General appearance: alert, cooperative and appears stated age Lymph nodes: Cervical, supraclavicular, and axillary nodes normal. Resp: clear to auscultation bilaterally Back: symmetric, no curvature. ROM normal. No CVA tenderness. Cardio: regular rate and rhythm GI: soft, non-tender; bowel sounds normal; no masses,  no organomegaly Extremities: extremities normal, atraumatic, no cyanosis or edema Neurologic: Grossly normal   LABORATORY DATA: Lab Results  Component Value Date   WBC 8.2 12/03/2012   HGB 12.8 12/03/2012   HCT 37.7 12/03/2012   MCV 90.6 12/03/2012   PLT 253 12/03/2012      Chemistry      Component Value Date/Time   NA 143 12/03/2012 0953   NA 135 11/01/2012 1350   K 3.8 12/03/2012 0953   K 4.2 11/01/2012 1350   CL 96 11/01/2012 1350   CO2 24 12/03/2012 0953   CO2 26 11/01/2012 1350   BUN 13.2 12/03/2012 0953   BUN 10 11/01/2012 1350   CREATININE 0.7 12/03/2012 0953   CREATININE 0.64 11/01/2012 1350      Component Value Date/Time   CALCIUM 9.7 12/03/2012 0953   CALCIUM 9.7 11/01/2012 1350   ALKPHOS 83 12/03/2012 0953   ALKPHOS 77 02/17/2011 1045   AST 16 12/03/2012 0953   AST 26 02/17/2011 1045   ALT 15  12/03/2012 0953   ALT 36* 02/17/2011 1045   BILITOT 0.54 12/03/2012 0953   BILITOT 0.5 02/17/2011 1045     Diagnosis 1. Breast, lumpectomy, Right - INVASIVE GRADE II DUCTAL CARCINOMA, SPANNING 1.6 CM IN GREATEST DIMENSION. 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 - TUMOR INVOLVES DERMIS. - LYMPH-VASCULAR INVASION IS IDENTIFIED. - INVASIVE DUCTAL CARCINOMA IS EXTREMELY CLOSE TO ANTERIOR MARGIN (LESS THAN 0.1 MM). - OTHER MARGINS ARE NEGATIVE. - SEE ONCOLOGY TEMPLATE. 2. Lymph node, sentinel, biopsy, Right axillary #1 - ONE BENIGN LYMPH NODE WITH NO TUMOR SEEN (0/1). - SEE COMMENT. 3. Lymph node, sentinel, biopsy, Right axillary #2 - ONE BENIGN LYMPH NODE WITH NO TUMOR SEEN (0/1). - SEE COMMENT. 4. Lymph node, sentinel, biopsy, Right axillary #3 - ONE BENIGN LYMPH NODE WITH NO TUMOR SEEN (0/1). - SEE COMMENT. 5. Lymph node, sentinel, biopsy, Right axillary #4 - ONE BENIGN LYMPH NODE WITH NO TUMOR SEEN (0/1). - SEE COMMENT. 6. Lymph node, biopsy, Right axillary tissue - ONE BENIGN LYMPH NODE WITH NO TUMOR SEEN (0/1). - SEE COMMENT. Microscopic Comment 1. BREAST, INVASIVE TUMOR, WITH LYMPH NODE SAMPLING Specimen, including laterality: Right partial breast with sentinel lymph node sampling and right axillary lymph node biopsy. Procedure: Right breast lumpectomy with sentinel lymph node biopsies and right axillary lymph node biopsy. Grade: II 1 of 4 FINAL for JRUE, YAMBAO ([id number]) Microscopic Comment(continued) Tubule formation: 3. Nuclear pleomorphism: 2. Mitotic: 2. Tumor size (gross measurement): 1.6 cm. Margins: Invasive, distance to closest margin: Less than 0.1 mm to anterior margin. In-situ, distance to closest margin: At least 0.2 cm. Lymphovascular invasion: Yes. Ductal carcinoma in situ: Yes. Grade: Intermediate grade. Extensive intraductal component: No. Lobular neoplasia: Not identified. Tumor focality: Unifocal. Treatment effect: Not  applicable. Extent of tumor: Skin: Invasive ductal carcinoma involves dermis of skin. Breast: Invasive ductal carcinoma involves breast parenchyma. Additional Site: No additional sites received. Lymph nodes: # examined: 5. Lymph nodes with metastasis: 0. Breast prognostic profile: Performed on previous case [id number] Estrogen receptor: 100%, positive. Progesterone receptor: 100%, positive. Her 2 neu: 2.96, amplified. Ki-67: 45%. Non-neoplastic breast: Unremarkable. TNM: pT1c, pN0, MX. Comments: An E-cadherin stain is performed on a representative block of tumor.  It is positive, confirming the above diagnoses. 2. -6. Immunohistochemical stains for cytokeratin AE1 / AE3 are performed on all lymph nodes (five stains total). These stains are negative, confirming the lack of metastatic carcinoma. (RAH:caf 10/08/12) Zandra Abts MD Pathologist, Electronic Signature (Case signed 10/10/2012) Specimen Gross and Clinical Information Specimen(s) Obtained:  RADIOGRAPHIC STUDIES:  No results found.  ASSESSMENT: 72 year old female with  #1 HER-2 positive stage I breast cancer status post lumpectomy sentinel lymph node biopsy. Her tumor was ER positive HER-2/neu positive. She is recommended adjuvant chemotherapy consisting of Taxotere carboplatinum Herceptin. We discussed risks benefits and rationale. She was to start today unfortunately she broke her wrist and we will hold the chemotherapy part but she is willing to proceed with Herceptin alone.   PLAN:   #1 we will proceed with Herceptin.  #2 she will return in one week's time for followup and Herceptin again we will hold chemotherapy until 12/10/2012.   All questions were answered. The patient knows to call the clinic with any problems, questions or concerns. We can certainly see the patient much sooner if necessary.  I spent 25 minutes counseling the patient face to face. The total time spent in the appointment was 25  minutes.    Drue Second, MD Medical/Oncology [facility] [phone] (beeper) [phone] (Office)

## 2012-12-10 ENCOUNTER — Ambulatory Visit (HOSPITAL_BASED_OUTPATIENT_CLINIC_OR_DEPARTMENT_OTHER): Payer: Medicare Other | Admitting: Oncology

## 2012-12-10 ENCOUNTER — Telehealth: Payer: Self-pay | Admitting: *Deleted

## 2012-12-10 ENCOUNTER — Ambulatory Visit (HOSPITAL_BASED_OUTPATIENT_CLINIC_OR_DEPARTMENT_OTHER): Payer: Medicare Other

## 2012-12-10 ENCOUNTER — Encounter: Payer: Self-pay | Admitting: Oncology

## 2012-12-10 ENCOUNTER — Other Ambulatory Visit (HOSPITAL_BASED_OUTPATIENT_CLINIC_OR_DEPARTMENT_OTHER): Payer: Medicare Other | Admitting: Lab

## 2012-12-10 ENCOUNTER — Telehealth: Payer: Self-pay | Admitting: Oncology

## 2012-12-10 ENCOUNTER — Other Ambulatory Visit: Payer: Self-pay | Admitting: Emergency Medicine

## 2012-12-10 VITALS — BP 122/63 | HR 61 | Temp 98.2°F | Resp 18

## 2012-12-10 VITALS — BP 136/76 | HR 76 | Temp 98.3°F | Resp 20 | Ht 66.0 in | Wt 186.1 lb

## 2012-12-10 DIAGNOSIS — Z5111 Encounter for antineoplastic chemotherapy: Secondary | ICD-10-CM

## 2012-12-10 DIAGNOSIS — C50119 Malignant neoplasm of central portion of unspecified female breast: Secondary | ICD-10-CM

## 2012-12-10 DIAGNOSIS — C50111 Malignant neoplasm of central portion of right female breast: Secondary | ICD-10-CM

## 2012-12-10 DIAGNOSIS — Z5112 Encounter for antineoplastic immunotherapy: Secondary | ICD-10-CM

## 2012-12-10 DIAGNOSIS — Z170 Estrogen receptor positive status [ER+]: Secondary | ICD-10-CM

## 2012-12-10 LAB — CBC WITH DIFFERENTIAL/PLATELET
BASO%: 0.1 % (ref 0.0–2.0)
Basophils Absolute: 0 10*3/uL (ref 0.0–0.1)
EOS%: 0 % (ref 0.0–7.0)
Eosinophils Absolute: 0 10*3/uL (ref 0.0–0.5)
HCT: 38.8 % (ref 34.8–46.6)
LYMPH%: 12.4 % — ABNORMAL LOW (ref 14.0–49.7)
MCH: 30.6 pg (ref 25.1–34.0)
MCHC: 34.3 g/dL (ref 31.5–36.0)
MCV: 89.4 fL (ref 79.5–101.0)
MONO%: 4.3 % (ref 0.0–14.0)
NEUT%: 83.2 % — ABNORMAL HIGH (ref 38.4–76.8)
RDW: 13.9 % (ref 11.2–14.5)
lymph#: 2.1 10*3/uL (ref 0.9–3.3)

## 2012-12-10 LAB — COMPREHENSIVE METABOLIC PANEL (CC13)
ALT: 15 U/L (ref 0–55)
AST: 12 U/L (ref 5–34)
Alkaline Phosphatase: 85 U/L (ref 40–150)
Anion Gap: 11 mEq/L (ref 3–11)
BUN: 16.3 mg/dL (ref 7.0–26.0)
Creatinine: 0.9 mg/dL (ref 0.6–1.1)
Glucose: 160 mg/dl — ABNORMAL HIGH (ref 70–140)
Potassium: 4.1 mEq/L (ref 3.5–5.1)
Total Bilirubin: 0.33 mg/dL (ref 0.20–1.20)
Total Protein: 7.3 g/dL (ref 6.4–8.3)

## 2012-12-10 MED ORDER — ONDANSETRON 16 MG/50ML IVPB (CHCC)
16.0000 mg | Freq: Once | INTRAVENOUS | Status: AC
  Administered 2012-12-10: 16 mg via INTRAVENOUS

## 2012-12-10 MED ORDER — SODIUM CHLORIDE 0.9 % IV SOLN
Freq: Once | INTRAVENOUS | Status: AC
  Administered 2012-12-10: 12:00:00 via INTRAVENOUS

## 2012-12-10 MED ORDER — ACETAMINOPHEN 325 MG PO TABS
ORAL_TABLET | ORAL | Status: AC
  Filled 2012-12-10: qty 2

## 2012-12-10 MED ORDER — DIPHENHYDRAMINE HCL 25 MG PO CAPS
ORAL_CAPSULE | ORAL | Status: AC
  Filled 2012-12-10: qty 2

## 2012-12-10 MED ORDER — HEPARIN SOD (PORK) LOCK FLUSH 100 UNIT/ML IV SOLN
500.0000 [IU] | Freq: Once | INTRAVENOUS | Status: AC | PRN
  Administered 2012-12-10: 500 [IU]
  Filled 2012-12-10: qty 5

## 2012-12-10 MED ORDER — SODIUM CHLORIDE 0.9 % IV SOLN
2.0000 mg/kg | Freq: Once | INTRAVENOUS | Status: AC
  Administered 2012-12-10: 168 mg via INTRAVENOUS
  Filled 2012-12-10: qty 8

## 2012-12-10 MED ORDER — DEXAMETHASONE SODIUM PHOSPHATE 20 MG/5ML IJ SOLN
INTRAMUSCULAR | Status: AC
  Filled 2012-12-10: qty 5

## 2012-12-10 MED ORDER — ACETAMINOPHEN 325 MG PO TABS
650.0000 mg | ORAL_TABLET | Freq: Once | ORAL | Status: AC
  Administered 2012-12-10: 650 mg via ORAL

## 2012-12-10 MED ORDER — DEXAMETHASONE SODIUM PHOSPHATE 20 MG/5ML IJ SOLN
20.0000 mg | Freq: Once | INTRAMUSCULAR | Status: AC
  Administered 2012-12-10: 20 mg via INTRAVENOUS

## 2012-12-10 MED ORDER — DOCETAXEL CHEMO INJECTION 160 MG/16ML
75.0000 mg/m2 | Freq: Once | INTRAVENOUS | Status: AC
  Administered 2012-12-10: 150 mg via INTRAVENOUS
  Filled 2012-12-10: qty 15

## 2012-12-10 MED ORDER — UNABLE TO FIND: Status: DC

## 2012-12-10 MED ORDER — DIPHENHYDRAMINE HCL 25 MG PO CAPS
50.0000 mg | ORAL_CAPSULE | Freq: Once | ORAL | Status: AC
  Administered 2012-12-10: 50 mg via ORAL

## 2012-12-10 MED ORDER — SODIUM CHLORIDE 0.9 % IJ SOLN
10.0000 mL | INTRAMUSCULAR | Status: DC | PRN
  Administered 2012-12-10: 10 mL
  Filled 2012-12-10: qty 10

## 2012-12-10 MED ORDER — ONDANSETRON 16 MG/50ML IVPB (CHCC)
INTRAVENOUS | Status: AC
  Filled 2012-12-10: qty 16

## 2012-12-10 MED ORDER — CARBOPLATIN CHEMO INJECTION 600 MG/60ML
567.6000 mg | Freq: Once | INTRAVENOUS | Status: AC
  Administered 2012-12-10: 570 mg via INTRAVENOUS
  Filled 2012-12-10: qty 57

## 2012-12-10 NOTE — Progress Notes (Signed)
OFFICE PROGRESS NOTE  CC**  Angela Garbe, MD [address] [facility], [place]. 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     PRIOR THERAPY: 1.  Patient palpated a mass in August 2014. She went on to have a mammogram performed that showed a 7 mm mass at the 12:00 position. The ultrasound showed the mass to be 7 mm) nipple. MRI showed a 1.7 cm irregular mass. Patient had a biopsy performed that showed invasive ductal carcinoma intermediate grade tumor was ER positive PR positive HER-2/neu positive with amplification of 2.96. Ki-67 was elevated at 45%.  2.  Patient underwent lumpectomy on 10/04/12 with Dr. Dwain Sarna he removed a 1.6cm invasive grade II ductal carcinoma with lymphovascular invasion.  0/5 lymph nodes were positive.   3.  Patient was supposed to start [facility] therapy 11/19/12, however she underwent surgery on her arm for a fracture due to fall on 11/06/12, therefore, she received three weeks of herceptin only starting on 10/6, with [facility]  Therapy will begin on 10/27.  CURRENT THERAPY: cycle 1 day1TCH with weekly Herceptin  INTERVAL HISTORY: Angela [facility] 72 y.o. female returns for followup visit today. Overall she's doing well. She does tell me that she developed a urinary tract infection and was seen by Dr. Wylene Simmer: Has started him on antibiotics. She completes her last antibiotic today. Today she is feeling well no fevers chills night sweats headaches no shortness of breath chest pains palpitations her left wrist is improving significantly with good mobility. She has no nausea vomiting no myalgias and arthralgias. Remainder of the 10 point review of systems is negative. MEDICAL HISTORY: Past Medical History  Diagnosis Date  . Syncope   . Diastolic dysfunction   . Hyperlipidemia   . Episode of dizziness     Mild episodes  .  Orthostatic hypotension     Some episodes  . Arthritis     oa  . Stress incontinence, female     wears pads  . AC (acromioclavicular) joint bone spurs     bone spurs in neck  . 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 Diabetes mellitus     niddm; last A1C 6.2  . Skin cancer   . Hypertension   . Wears glasses     ALLERGIES:  is allergic to oxycodone.  MEDICATIONS:  Current Outpatient Prescriptions  Medication Sig Dispense Refill  . Alpha-Lipoic Acid 600 MG CAPS Take 1 each by mouth daily.      . carvedilol (COREG) 6.25 MG tablet Take 1 tablet (6.25 mg total) by mouth 2 (two) times daily with a meal.  180 tablet  3  . dexamethasone (DECADRON) 4 MG tablet Take 2 tablets (8 mg total) by mouth 2 (two) times daily with a meal. Take two times a day the day before Taxotere. Then take two times a day starting the day after chemo for 3 days.  30 tablet  1  . glipiZIDE-metformin (METAGLIP) 2.5-500 MG per tablet Take 1 tablet by mouth every morning.       Marland Kitchen HYDROcodone-acetaminophen (NORCO) 5-325 MG per tablet 1-2 tabs po q6 hours prn pain  40 tablet  0  . HYDROcodone-acetaminophen (NORCO/VICODIN) 5-325 MG per tablet Take 1 tablet by mouth every 6 (six) hours as needed for pain.      Marland Kitchen lidocaine-prilocaine (EMLA) cream Apply topically as  needed.  30 g  1  . LORazepam (ATIVAN) 0.5 MG tablet Take 1 tablet (0.5 mg total) by mouth every 6 (six) hours as needed (Nausea or vomiting).  30 tablet  0  . lovastatin (MEVACOR) 10 MG tablet Take 10 mg by mouth at bedtime.       . Multiple Vitamin (MULTIVITAMIN) capsule Take 1 capsule by mouth daily.      . ondansetron (ZOFRAN) 8 MG tablet Take 1 tablet (8 mg total) by mouth 2 (two) times daily. Take two times a day starting the day after chemo for 3 days. Then take two times a day as needed for nausea or vomiting.  30 tablet  1  . prochlorperazine (COMPAZINE) 10 MG tablet Take 1 tablet (10 mg total) by mouth every 6 (six) hours as needed  (Nausea or vomiting).  30 tablet  1  . tretinoin (RETIN-A) 0.025 % cream Apply topically at bedtime.      Marland Kitchen UNABLE TO FIND Rx: L8015-Post Mastectomy Camisole (Quantity: 2); L8000-Post Surgical Bras (Quantity: 6) Dx: 174.9 Right partial mastectomy  1 each  0   No current facility-administered medications for this visit.   Facility-Administered Medications Ordered in Other Visits  Medication Dose Route Frequency Provider Last Rate Last Dose  . gentamicin (GARAMYCIN) 160 mg in dextrose 5 % 50 mL IVPB  160 mg Intravenous 30 min Pre-Op Marcine Matar, MD        SURGICAL HISTORY:  Past Surgical History  Procedure Laterality Date  . Other surgical history      Hysterectomy  . Cholecystectomy  1992  . Abdominal hysterectomy  1985    1 ovary removed  . Total hip arthroplasty  02/23/2011    Procedure: TOTAL HIP ARTHROPLASTY;  Surgeon: Gus Rankin Aluisio;  Location: [facility];  Service: Orthopedics;  Laterality: Left;  . Pubovaginal sling  02/03/2012    Procedure: Leonides Grills;  Surgeon: Marcine Matar, MD;  Location: [facility];  Service: Urology;  Laterality: N/A;  1 HR  LYNX SLING  . Tonsillectomy    . Breast lumpectomy with needle localization and axillary sentinel lymph node bx Right 10/04/2012    Procedure: RIGHT BREAST LUMPECTOMY WITH NEEDLE LOCALIZATION AND AXILLARY SENTINEL LYMPH NODE BX;  Surgeon: Emelia Loron, MD;  Location: [facility];  Service: General;  Laterality: Right;  . Portacath placement Left 10/04/2012    Procedure: INSERTION PORT-A-CATH;  Surgeon: Emelia Loron, MD;  Location: [facility];  Service: General;  Laterality: Left;  . Orif wrist fracture Left 11/06/2012    Procedure: LEFT OPEN REDUCTION INTERNAL FIXATION (ORIF) DISTAL RADIUS WRIST FRACTURE;  Surgeon: Tami Ribas, MD;  Location: [facility];  Service: Orthopedics;  Laterality: Left;    REVIEW OF SYSTEMS:  A 10 point review of systems was  conducted and is otherwise negative except for what is noted above.    PHYSICAL EXAMINATION: Blood pressure 136/76, pulse 76, temperature 98.3 F (36.8 C), temperature source Oral, resp. rate 20, height 5\' 6"  (1.676 m), weight 186 lb 1.6 oz (84.414 kg). Body mass index is 30.05 kg/(m^2). General: Patient is a well appearing female in no acute distress HEENT: PERRLA, sclerae anicteric no conjunctival pallor, MMM Neck: supple, no palpable adenopathy Lungs: clear to auscultation bilaterally, no wheezes, rhonchi, or rales Cardiovascular: regular rate rhythm, S1, S2, no murmurs, rubs or gallops Abdomen: Soft, non-tender, non-distended, normoactive bowel sounds, no HSM Extremities: warm and well perfused, no clubbing, cyanosis, or edema, left  wrist in brace Skin: No rashes or lesions Neuro: Non-focal ECOG PERFORMANCE STATUS: 1 - Symptomatic but completely ambulatory      LABORATORY DATA: Lab Results  Component Value Date   WBC 16.7* 12/10/2012   HGB 13.3 12/10/2012   HCT 38.8 12/10/2012   MCV 89.4 12/10/2012   PLT 322 12/10/2012      Chemistry      Component Value Date/Time   NA 143 12/03/2012 0953   NA 135 11/01/2012 1350   K 3.8 12/03/2012 0953   K 4.2 11/01/2012 1350   CL 96 11/01/2012 1350   CO2 24 12/03/2012 0953   CO2 26 11/01/2012 1350   BUN 13.2 12/03/2012 0953   BUN 10 11/01/2012 1350   CREATININE 0.7 12/03/2012 0953   CREATININE 0.64 11/01/2012 1350      Component Value Date/Time   CALCIUM 9.7 12/03/2012 0953   CALCIUM 9.7 11/01/2012 1350   ALKPHOS 83 12/03/2012 0953   ALKPHOS 77 02/17/2011 1045   AST 16 12/03/2012 0953   AST 26 02/17/2011 1045   ALT 15 12/03/2012 0953   ALT 36* 02/17/2011 1045   BILITOT 0.54 12/03/2012 0953   BILITOT 0.5 02/17/2011 1045       RADIOGRAPHIC STUDIES:  Dg Forearm Left  10/30/2012   CLINICAL DATA:  Fall, forearm pain  EXAM: LEFT FOREARM - 2 VIEW  COMPARISON:  None.  FINDINGS: Two views of left forearm submitted. There is nondisplaced  minimal impacted fracture in distal left radial metaphysis.  IMPRESSION: Nondisplaced minimally impacted fracture in distal left radial metaphysis.   Electronically Signed   By: Natasha Mead   On: 10/30/2012 18:20    ASSESSMENT: #1 palpable right breast mass at the 12:00 position measuring 7 mm ultrasound confirmed this mass close to the nipple. MRI revealed the mass to be irregular and measuring 1.7 cm not involving the skin. Biopsy revealed invasive ductal carcinoma intermediate grade ER positive PR positive HER-2/neu positive with a Ki-67 of 45%.   #2.  Patient underwent lumpectomy on 10/04/12 with Dr. Dwain Sarna he removed a 1.6cm invasive grade II ductal carcinoma with lymphovascular invasion.  0/5 lymph nodes were positive.   #3 patient is now ready to begin her chemotherapy consisting of Taxotere carboplatinum with Herceptin. She will begin this today 12/10/2012. She will receive day 2 Neulasta. We discussed how to take her antiemetics.   PLAN:  #1 patient will proceed with cycle 1 of TCH.  #2 she will return in one week's time for followup, labs, and Herceptin only.   All questions were answered. The patient knows to call the clinic with any problems, questions or concerns. We can certainly see the patient much sooner if necessary.  I spent 25 minutes counseling the patient face to face. The total time spent in the appointment was 30 minutes.    Illa Level, NP Medical Oncology [facility] [phone] 12/10/2012, 10:34 AM

## 2012-12-10 NOTE — Patient Instructions (Signed)
#  1 proceed with TCH combination today.  #2 he'll return tomorrow for Neulasta injection.  #3 we will see you back on November 3 for followup blood work, office visit, and Herceptin.  #4 in preparation for her Neulasta tomorrow please take Tylenol 500 mg and Zyrtec or Claritin prior to coming for the injection. Thereafter continue the Zyrtec or Claritin daily for the next 5 days.  #5 for antiemetics continue to use them on as-needed basis or as directed on the separate printout

## 2012-12-10 NOTE — Patient Instructions (Signed)
Cancer Center Discharge Instructions for Patients Receiving Chemotherapy  Today you received the following chemotherapy agents:  Taxotere, Carboplatin and Herceptin  To help prevent nausea and vomiting after your treatment, we encourage you to take your nausea medication as ordered per MD.   If you develop nausea and vomiting that is not controlled by your nausea medication, call the clinic.   BELOW ARE SYMPTOMS THAT SHOULD BE REPORTED IMMEDIATELY:  *FEVER GREATER THAN 100.5 F  *CHILLS WITH OR WITHOUT FEVER  NAUSEA AND VOMITING THAT IS NOT CONTROLLED WITH YOUR NAUSEA MEDICATION  *UNUSUAL SHORTNESS OF BREATH  *UNUSUAL BRUISING OR BLEEDING  TENDERNESS IN MOUTH AND THROAT WITH OR WITHOUT PRESENCE OF ULCERS  *URINARY PROBLEMS  *BOWEL PROBLEMS  UNUSUAL RASH Items with * indicate a potential emergency and should be followed up as soon as possible.  Feel free to call the clinic you have any questions or concerns. The clinic phone number is [phone].    

## 2012-12-10 NOTE — Telephone Encounter (Signed)
Per staff message and POF I have scheduled appts.  JMW  

## 2012-12-11 ENCOUNTER — Ambulatory Visit (HOSPITAL_BASED_OUTPATIENT_CLINIC_OR_DEPARTMENT_OTHER): Payer: Medicare Other

## 2012-12-11 ENCOUNTER — Telehealth (HOSPITAL_COMMUNITY): Payer: Self-pay | Admitting: *Deleted

## 2012-12-11 VITALS — BP 119/58 | HR 64 | Temp 98.6°F

## 2012-12-11 DIAGNOSIS — C50119 Malignant neoplasm of central portion of unspecified female breast: Secondary | ICD-10-CM

## 2012-12-11 DIAGNOSIS — C50111 Malignant neoplasm of central portion of right female breast: Secondary | ICD-10-CM

## 2012-12-11 MED ORDER — PEGFILGRASTIM INJECTION 6 MG/0.6ML
6.0000 mg | Freq: Once | SUBCUTANEOUS | Status: AC
  Administered 2012-12-11: 6 mg via SUBCUTANEOUS
  Filled 2012-12-11: qty 0.6

## 2012-12-11 NOTE — Telephone Encounter (Signed)
Delynda here for Neulasta injection following 1st tch chemotherapy.  States that she is doing well.  No nausea or vomiting.  Having diarrhea but this something she has had for a long time.  Is drinking and eating without problems.  All questions answered.  Knows to call if she has any problems or questions.

## 2012-12-11 NOTE — Patient Instructions (Signed)

## 2012-12-17 ENCOUNTER — Other Ambulatory Visit (HOSPITAL_BASED_OUTPATIENT_CLINIC_OR_DEPARTMENT_OTHER): Payer: Medicare Other | Admitting: Lab

## 2012-12-17 ENCOUNTER — Encounter: Payer: Self-pay | Admitting: Oncology

## 2012-12-17 ENCOUNTER — Ambulatory Visit (HOSPITAL_BASED_OUTPATIENT_CLINIC_OR_DEPARTMENT_OTHER): Payer: Medicare Other | Admitting: Oncology

## 2012-12-17 ENCOUNTER — Ambulatory Visit (HOSPITAL_BASED_OUTPATIENT_CLINIC_OR_DEPARTMENT_OTHER): Payer: Medicare Other

## 2012-12-17 VITALS — BP 144/86 | HR 103 | Temp 98.6°F | Resp 20 | Ht 66.0 in | Wt 178.1 lb

## 2012-12-17 DIAGNOSIS — C50111 Malignant neoplasm of central portion of right female breast: Secondary | ICD-10-CM

## 2012-12-17 DIAGNOSIS — Z170 Estrogen receptor positive status [ER+]: Secondary | ICD-10-CM

## 2012-12-17 DIAGNOSIS — R5381 Other malaise: Secondary | ICD-10-CM

## 2012-12-17 DIAGNOSIS — C50119 Malignant neoplasm of central portion of unspecified female breast: Secondary | ICD-10-CM

## 2012-12-17 DIAGNOSIS — E860 Dehydration: Secondary | ICD-10-CM

## 2012-12-17 DIAGNOSIS — K121 Other forms of stomatitis: Secondary | ICD-10-CM

## 2012-12-17 DIAGNOSIS — Z5112 Encounter for antineoplastic immunotherapy: Secondary | ICD-10-CM

## 2012-12-17 LAB — CBC WITH DIFFERENTIAL/PLATELET
BASO%: 0.4 % (ref 0.0–2.0)
Eosinophils Absolute: 0.1 10*3/uL (ref 0.0–0.5)
HGB: 14.5 g/dL (ref 11.6–15.9)
LYMPH%: 17.6 % (ref 14.0–49.7)
MCHC: 34.6 g/dL (ref 31.5–36.0)
MCV: 88.2 fL (ref 79.5–101.0)
MONO#: 1.2 10*3/uL — ABNORMAL HIGH (ref 0.1–0.9)
MONO%: 10.8 % (ref 0.0–14.0)
NEUT#: 7.7 10*3/uL — ABNORMAL HIGH (ref 1.5–6.5)
RBC: 4.75 10*6/uL (ref 3.70–5.45)
RDW: 13.8 % (ref 11.2–14.5)
WBC: 10.9 10*3/uL — ABNORMAL HIGH (ref 3.9–10.3)
lymph#: 1.9 10*3/uL (ref 0.9–3.3)
nRBC: 0 % (ref 0–0)

## 2012-12-17 LAB — COMPREHENSIVE METABOLIC PANEL (CC13)
ALT: 235 U/L — ABNORMAL HIGH (ref 0–55)
AST: 27 U/L (ref 5–34)
Anion Gap: 14 mEq/L — ABNORMAL HIGH (ref 3–11)
CO2: 23 mEq/L (ref 22–29)
Creatinine: 0.9 mg/dL (ref 0.6–1.1)
Potassium: 3.6 mEq/L (ref 3.5–5.1)
Sodium: 134 mEq/L — ABNORMAL LOW (ref 136–145)
Total Bilirubin: 1.05 mg/dL (ref 0.20–1.20)
Total Protein: 6.9 g/dL (ref 6.4–8.3)

## 2012-12-17 MED ORDER — MAGIC MOUTHWASH W/LIDOCAINE: 5.0000 mL | Freq: Four times a day (QID) | ORAL | Status: DC | PRN

## 2012-12-17 MED ORDER — DIPHENHYDRAMINE HCL 25 MG PO CAPS
ORAL_CAPSULE | ORAL | Status: AC
  Filled 2012-12-17: qty 2

## 2012-12-17 MED ORDER — SODIUM CHLORIDE 0.9 % IJ SOLN
10.0000 mL | INTRAMUSCULAR | Status: DC | PRN
  Administered 2012-12-17: 10 mL
  Filled 2012-12-17: qty 10

## 2012-12-17 MED ORDER — SODIUM CHLORIDE 0.9 % IV SOLN
2.0000 mg/kg | Freq: Once | INTRAVENOUS | Status: AC
  Administered 2012-12-17: 168 mg via INTRAVENOUS
  Filled 2012-12-17: qty 8

## 2012-12-17 MED ORDER — DIPHENHYDRAMINE HCL 25 MG PO CAPS
50.0000 mg | ORAL_CAPSULE | Freq: Once | ORAL | Status: AC
  Administered 2012-12-17: 50 mg via ORAL

## 2012-12-17 MED ORDER — SODIUM CHLORIDE 0.9 % IV SOLN
Freq: Once | INTRAVENOUS | Status: AC
  Administered 2012-12-17: 10:00:00 via INTRAVENOUS

## 2012-12-17 MED ORDER — HEPARIN SOD (PORK) LOCK FLUSH 100 UNIT/ML IV SOLN
500.0000 [IU] | Freq: Once | INTRAVENOUS | Status: AC | PRN
  Administered 2012-12-17: 500 [IU]
  Filled 2012-12-17: qty 5

## 2012-12-17 MED ORDER — ACETAMINOPHEN 325 MG PO TABS
650.0000 mg | ORAL_TABLET | Freq: Once | ORAL | Status: AC
  Administered 2012-12-17: 650 mg via ORAL

## 2012-12-17 MED ORDER — ACETAMINOPHEN 325 MG PO TABS
ORAL_TABLET | ORAL | Status: AC
  Filled 2012-12-17: qty 2

## 2012-12-17 NOTE — Progress Notes (Signed)
OFFICE PROGRESS NOTE  CC**  Angela Garbe, MD [address] [facility] [place], [place]. 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     PRIOR THERAPY: 1.  Patient palpated a mass in August 2014. She went on to have a mammogram performed that showed a 7 mm mass at the 12:00 position. The ultrasound showed the mass to be 7 mm) nipple. MRI showed a 1.7 cm irregular mass. Patient had a biopsy performed that showed invasive ductal carcinoma intermediate grade tumor was ER positive PR positive HER-2/neu positive with amplification of 2.96. Ki-67 was elevated at 45%.  2.  Patient underwent lumpectomy on 10/04/12 with Dr. Dwain Rosario he Rosario a 1.6cm invasive grade II ductal carcinoma with lymphovascular invasion.  0/5 lymph nodes were positive.   3.  Patient was supposed to start [facility] therapy 11/19/12, however she underwent surgery on her arm for a fracture due to fall on 11/06/12, therefore, she received three weeks of herceptin only starting on 10/6, with Angela Rosario  Therapy will begin on 10/27.  CURRENT THERAPY:  weekly Herceptin  INTERVAL HISTORY: Angela Rosario 72 y.o. female returns for followup visit today. Patient received cycle 1 of TCH last week. She certainly does feel tired and fatigued and dehydrated. She also has mucositis which is making it painful for her to eat. I have recommended doing Angela Rosario and natural dentist mouth rinses to see if we can resolve these. She denies having any nausea or vomiting she has no fevers chills or night sweats. No peripheral paresthesias no rashes. Remainder of the 10 point review of systems is negative.  MEDICAL HISTORY: Past Medical History  Diagnosis Date  . Syncope   . Diastolic dysfunction   . Hyperlipidemia   . Episode of dizziness     Mild episodes  . Orthostatic hypotension     Some episodes  .  Arthritis     oa  . Stress incontinence, female     wears pads  . AC (acromioclavicular) joint bone spurs     bone spurs in neck  . 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 Diabetes mellitus     niddm; last A1C 6.2  . Skin cancer   . Hypertension   . Wears glasses     ALLERGIES:  is allergic to oxycodone.  MEDICATIONS:  Current Outpatient Prescriptions  Medication Sig Dispense Refill  . Alpha-Lipoic Acid 600 MG CAPS Take 1 each by mouth daily.      . carvedilol (COREG) 6.25 MG tablet Take 1 tablet (6.25 mg total) by mouth 2 (two) times daily with a meal.  180 tablet  3  . glipiZIDE-metformin (METAGLIP) 2.5-500 MG per tablet Take 1 tablet by mouth every morning.       . lidocaine-prilocaine (EMLA) cream Apply topically as needed.  30 g  1  . lovastatin (MEVACOR) 10 MG tablet Take 10 mg by mouth at bedtime.       . Multiple Vitamin (MULTIVITAMIN) capsule Take 1 capsule by mouth daily.      . ondansetron (ZOFRAN) 8 MG tablet Take 1 tablet (8 mg total) by mouth 2 (two) times daily. Take two times a day starting the day after chemo for 3 days. Then take two times a day as needed for nausea or vomiting.  30 tablet  1  . prochlorperazine (COMPAZINE) 10 MG tablet  Take 1 tablet (10 mg total) by mouth every 6 (six) hours as needed (Nausea or vomiting).  30 tablet  1  . tretinoin (RETIN-A) 0.025 % cream Apply topically at bedtime.      Marland Kitchen dexamethasone (DECADRON) 4 MG tablet Take 2 tablets (8 mg total) by mouth 2 (two) times daily with a meal. Take two times a day the day before Taxotere. Then take two times a day starting the day after chemo for 3 days.  30 tablet  1  . HYDROcodone-acetaminophen (NORCO) 5-325 MG per tablet 1-2 tabs po q6 hours prn pain  40 tablet  0  . HYDROcodone-acetaminophen (NORCO/VICODIN) 5-325 MG per tablet Take 1 tablet by mouth every 6 (six) hours as needed for pain.      Marland Kitchen LORazepam (ATIVAN) 0.5 MG tablet Take 1 tablet (0.5 mg total) by mouth every 6  (six) hours as needed (Nausea or vomiting).  30 tablet  0  . UNABLE TO FIND Rx: L8015-Post Mastectomy Camisole (Quantity: 2); L8000-Post Surgical Bras (Quantity: 6) Dx: 174.9 Right partial mastectomy  1 each  0  . 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  1 Units  0   No current facility-administered medications for this visit.   Facility-Administered Medications Ordered in Other Visits  Medication Dose Route Frequency Provider Last Rate Last Dose  . gentamicin (GARAMYCIN) 160 mg in dextrose 5 % 50 mL IVPB  160 mg Intravenous 30 min Pre-Op Angela Matar, MD        SURGICAL HISTORY:  Past Surgical History  Procedure Laterality Date  . Other surgical history      Hysterectomy  . Cholecystectomy  1992  . Abdominal hysterectomy  1985    1 ovary Rosario  . Total hip arthroplasty  02/23/2011    Procedure: TOTAL HIP ARTHROPLASTY;  Surgeon: Angela Rosario;  Location: [facility];  Service: Orthopedics;  Laterality: Left;  . Pubovaginal sling  02/03/2012    Procedure: Angela Rosario;  Surgeon: Angela Matar, MD;  Location: [facility];  Service: Urology;  Laterality: N/A;  1 HR  LYNX SLING  . Tonsillectomy    . Breast lumpectomy with needle localization and axillary sentinel lymph node bx Right 10/04/2012    Procedure: RIGHT BREAST LUMPECTOMY WITH NEEDLE LOCALIZATION AND AXILLARY SENTINEL LYMPH NODE BX;  Surgeon: Angela Loron, MD;  Location: [facility];  Service: General;  Laterality: Right;  . Portacath placement Left 10/04/2012    Procedure: INSERTION PORT-A-CATH;  Surgeon: Angela Loron, MD;  Location: [facility];  Service: General;  Laterality: Left;  . Orif wrist fracture Left 11/06/2012    Procedure: LEFT OPEN REDUCTION INTERNAL FIXATION (ORIF) DISTAL RADIUS WRIST FRACTURE;  Surgeon: Angela Ribas, MD;  Location: [facility];  Service: Orthopedics;  Laterality: Left;    REVIEW OF SYSTEMS:   A 10 point review of systems was conducted and is otherwise negative except for what is noted above.    PHYSICAL EXAMINATION: Blood pressure 144/86, pulse 103, temperature 98.6 F (37 C), temperature source Oral, resp. rate 20, height 5\' 6"  (1.676 m), weight 178 lb 1.6 oz (80.786 kg). Body mass index is 28.76 kg/(m^2). General: Patient is a well appearing female in no acute distress HEENT: PERRLA, sclerae anicteric no conjunctival pallor, MMM Neck: supple, no palpable adenopathy Lungs: clear to auscultation bilaterally, no wheezes, rhonchi, or rales Cardiovascular: regular rate rhythm, S1, S2, no murmurs, rubs or gallops Abdomen: Soft, non-tender, non-distended, normoactive bowel  sounds, no HSM Extremities: warm and well perfused, no clubbing, cyanosis, or edema, left wrist in brace Skin: No rashes or lesions Neuro: Non-focal ECOG PERFORMANCE STATUS: 1 - Symptomatic but completely ambulatory      LABORATORY DATA: Lab Results  Component Value Date   WBC 10.9* 12/17/2012   HGB 14.5 12/17/2012   HCT 41.9 12/17/2012   MCV 88.2 12/17/2012   PLT 213 12/17/2012      Chemistry      Component Value Date/Time   NA 140 12/10/2012 0940   NA 135 11/01/2012 1350   K 4.1 12/10/2012 0940   K 4.2 11/01/2012 1350   CL 96 11/01/2012 1350   CO2 21* 12/10/2012 0940   CO2 26 11/01/2012 1350   BUN 16.3 12/10/2012 0940   BUN 10 11/01/2012 1350   CREATININE 0.9 12/10/2012 0940   CREATININE 0.64 11/01/2012 1350      Component Value Date/Time   CALCIUM 10.2 12/10/2012 0940   CALCIUM 9.7 11/01/2012 1350   ALKPHOS 85 12/10/2012 0940   ALKPHOS 77 02/17/2011 1045   AST 12 12/10/2012 0940   AST 26 02/17/2011 1045   ALT 15 12/10/2012 0940   ALT 36* 02/17/2011 1045   BILITOT 0.33 12/10/2012 0940   BILITOT 0.5 02/17/2011 1045       RADIOGRAPHIC STUDIES:  Dg Forearm Left  10/30/2012   CLINICAL DATA:  Fall, forearm pain  EXAM: LEFT FOREARM - 2 VIEW  COMPARISON:  None.  FINDINGS: Two views of left forearm  submitted. There is nondisplaced minimal impacted fracture in distal left radial metaphysis.  IMPRESSION: Nondisplaced minimally impacted fracture in distal left radial metaphysis.   Electronically Signed   By: Natasha Mead   On: 10/30/2012 18:20    ASSESSMENT: #1 palpable right breast mass at the 12:00 position measuring 7 mm ultrasound confirmed this mass close to the nipple. MRI revealed the mass to be irregular and measuring 1.7 cm not involving the skin. Biopsy revealed invasive ductal carcinoma intermediate grade ER positive PR positive HER-2/neu positive with a Ki-67 of 45%.   #2.  Patient underwent lumpectomy on 10/04/12 with Dr. Dwain Rosario he [place] a 1.6cm invasive grade II ductal carcinoma with lymphovascular invasion.  0/5 lymph nodes were positive.   #3 patient is now ready to begin her chemotherapy consisting of Taxotere carboplatinum with Herceptin. She will begin this today 12/10/2012. She will receive day 2 Neulasta. We discussed how to take her antiemetics.  #4 mucositis  #5 dehydration   PLAN:  #1 patient will proceed with Herceptin today.  #2 dehydration: We will give her IV fluids today and set her up for fluids on Thursday as well.  #3 mucositis I have given her prescription for Angela [facility]ario and instructed her on how to take it. She is also recommended to get the natural dentist from the vitamin store to see if that helps as well.  #4 patient will be seen back in one week's time for next dose of Herceptin.  All questions were answered. The patient knows to call the clinic with any problems, questions or concerns. We can certainly see the patient much sooner if necessary.  The length of time of the face-to-face encounter was 30    minutes. More than 50% of time was spent counseling and coordination of care.  Drue Second, MD Medical/Oncology [facility] [phone] (beeper) [phone] (Office)  12/17/2012, 9:39 AM

## 2012-12-17 NOTE — Patient Instructions (Signed)

## 2012-12-17 NOTE — Patient Instructions (Signed)
#  1 proceed with Herceptin. We will also give you IV fluids today.  #2 for mouth sores, mucositis, Magic mouthwash 5 cc swish and spit 4 times a day as needed.  #3 please purchase the natural dentist from the vitamin store. This is a mouth rinse that you can use 4-5 times a day to help suit the mucositis.  #4 we will give you IV fluids on Thursday if needed. Please call.

## 2012-12-18 ENCOUNTER — Ambulatory Visit (HOSPITAL_BASED_OUTPATIENT_CLINIC_OR_DEPARTMENT_OTHER): Payer: Medicare Other

## 2012-12-18 ENCOUNTER — Telehealth: Payer: Self-pay | Admitting: *Deleted

## 2012-12-18 ENCOUNTER — Other Ambulatory Visit: Payer: Self-pay | Admitting: Emergency Medicine

## 2012-12-18 VITALS — BP 147/86 | HR 85 | Temp 98.6°F | Resp 20

## 2012-12-18 DIAGNOSIS — C50119 Malignant neoplasm of central portion of unspecified female breast: Secondary | ICD-10-CM

## 2012-12-18 DIAGNOSIS — E860 Dehydration: Secondary | ICD-10-CM

## 2012-12-18 MED ORDER — SODIUM CHLORIDE 0.9 % IV SOLN: INTRAVENOUS | Status: AC

## 2012-12-18 MED ORDER — SODIUM CHLORIDE 0.9 % IV SOLN
INTRAVENOUS | Status: DC
  Administered 2012-12-18: 16:00:00 via INTRAVENOUS

## 2012-12-18 NOTE — Patient Instructions (Signed)
Dehydration, Adult  Dehydration means your body does not have as much fluid as it needs. Your kidneys, brain, and heart will not work properly without the right amount of fluids and salt.   HOME CARE   Ask your doctor how to replace body fluid losses (rehydrate).   Drink enough fluids to keep your pee (urine) clear or pale yellow.   Drink small amounts of fluids often if you feel sick to your stomach (nauseous) or throw up (vomit).   Eat like you normally do.   Avoid:   Foods or drinks high in sugar.   Bubbly (carbonated) drinks.   Juice.   Very hot or cold fluids.   Drinks with caffeine.   Fatty, greasy foods.   Alcohol.   Tobacco.   Eating too much.   Gelatin desserts.   Wash your hands to avoid spreading germs (bacteria, viruses).   Only take medicine as told by your doctor.   Keep all doctor visits as told.  GET HELP RIGHT AWAY IF:    You cannot drink something without throwing up.   You get worse even with treatment.   Your vomit has blood in it or looks greenish.   Your poop (stool) has blood in it or looks black and tarry.   You have not peed in 6 to 8 hours.   You pee a small amount of very dark pee.   You have a fever.   You pass out (faint).   You have belly (abdominal) pain that gets worse or stays in one spot (localizes).   You have a rash, stiff neck, or bad headache.   You get easily annoyed, sleepy, or are hard to wake up.   You feel weak, dizzy, or very thirsty.  MAKE SURE YOU:    Understand these instructions.   Will watch your condition.   Will get help right away if you are not doing well or get worse.  Document Released: 11/27/2008 Document Revised: 04/25/2011 Document Reviewed: 09/20/2010  [employer] Patient Information 2014 [employer]

## 2012-12-18 NOTE — Telephone Encounter (Signed)
Per staff message and POF I have scheduled appts.  JMW  

## 2012-12-18 NOTE — Progress Notes (Signed)
Chaplain made initial visit. Patient was alert, friendly, and ready to share. She stated that she'd had chemo yesterday and had to come back today for fluids because she was feeling so poorly. 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 Pt also discussed at length her bible study, which has held great meaning for her. Pt expressed how difficulty makes people either withdraw from God or approach him, and how she had turned toward him in her struggles with cancer and her divorce. Chaplain affirmed her faith and her friends from church as very positive sources of strength. Patient described finding out she had cancer and how she believed that god helped her find the lump so early. Patient reflected on the gift of a strong network of support in her church friends. She stated that some of her friends are having t-shirts made with her name on them for a cancer walk. Pt was clearly very moved by this. Chaplain facilitated the sharing of patient's story and provided reflective listening and empathic presence. Chaplain finished visit with a prayer that she would feel better and continue to receive good support. Pt expressed thanks for visit and asked when chaplain would be in [facility] again.

## 2012-12-19 ENCOUNTER — Telehealth: Payer: Self-pay | Admitting: Oncology

## 2012-12-20 ENCOUNTER — Ambulatory Visit (HOSPITAL_BASED_OUTPATIENT_CLINIC_OR_DEPARTMENT_OTHER): Payer: Medicare Other

## 2012-12-20 ENCOUNTER — Other Ambulatory Visit: Payer: Self-pay | Admitting: Emergency Medicine

## 2012-12-20 VITALS — BP 123/58 | HR 87 | Temp 100.1°F | Resp 18

## 2012-12-20 DIAGNOSIS — E860 Dehydration: Secondary | ICD-10-CM

## 2012-12-20 DIAGNOSIS — C50119 Malignant neoplasm of central portion of unspecified female breast: Secondary | ICD-10-CM

## 2012-12-20 MED ORDER — SODIUM CHLORIDE 0.9 % IJ SOLN
10.0000 mL | INTRAMUSCULAR | Status: DC | PRN
  Administered 2012-12-20: 10 mL via INTRAVENOUS
  Filled 2012-12-20: qty 10

## 2012-12-20 MED ORDER — HEPARIN SOD (PORK) LOCK FLUSH 100 UNIT/ML IV SOLN
500.0000 [IU] | Freq: Once | INTRAVENOUS | Status: AC
  Administered 2012-12-20: 500 [IU] via INTRAVENOUS
  Filled 2012-12-20: qty 5

## 2012-12-20 MED ORDER — CIPROFLOXACIN HCL 500 MG PO TABS: 500.0000 mg | ORAL_TABLET | Freq: Two times a day (BID) | ORAL | Status: DC

## 2012-12-20 MED ORDER — SODIUM CHLORIDE 0.9 % IV SOLN
INTRAVENOUS | Status: DC
  Administered 2012-12-20: 15:00:00 via INTRAVENOUS

## 2012-12-20 NOTE — Progress Notes (Signed)
[id number] Patient here today for IVF's. V/S check showed patient with low grade fever upon arrival, denies any other symptoms other than continued fatigue.  Temperature retaken approx 10 minutes later, temp at 101. Patient states she was not aware of having fever, states she went to PCP yesterday for UA and was informed today that it is negative. Dr Milta Deiters desk nurse informed. Waiting for orders if any. Will continue to monitor patient.  1515 Per Larna Daughters NP, patient to receive prescription for Cipro to be called into pt's pharmacy by desk nurse.  1700 VSS, continues with temp @ 100.1  Patient informed of cipro prescription to be p.u at her pharmacy. Denies any c/o of dizziness or nausea. Knows to call office with any questions or concerns. Patient verbalized understanding. D/C ambulating with friend. AVS provided.

## 2012-12-20 NOTE — Patient Instructions (Signed)
Dehydration, Adult Dehydration is when you lose more fluids from the body than you take in. Vital organs like the kidneys, brain, and heart cannot function without a proper amount of fluids and salt. Any loss of fluids from the body can cause dehydration.  CAUSES   Vomiting.  Diarrhea.  Excessive sweating.  Excessive urine output.  Fever. SYMPTOMS  Mild dehydration  Thirst.  Dry lips.  Slightly dry mouth. Moderate dehydration  Very dry mouth.  Sunken eyes.  Skin does not bounce back quickly when lightly pinched and released.  Dark urine and decreased urine production.  Decreased tear production.  Headache. Severe dehydration  Very dry mouth.  Extreme thirst.  Rapid, weak pulse (more than 100 beats per minute at rest).  Cold hands and feet.  Not able to sweat in spite of heat and temperature.  Rapid breathing.  Blue lips.  Confusion and lethargy.  Difficulty being awakened.  Minimal urine production.  No tears. DIAGNOSIS  Your caregiver will diagnose dehydration based on your symptoms and your exam. Blood and urine tests will help confirm the diagnosis. The diagnostic evaluation should also identify the cause of dehydration. TREATMENT  Treatment of mild or moderate dehydration can often be done at home by increasing the amount of fluids that you drink. It is best to drink small amounts of fluid more often. Drinking too much at one time can make vomiting worse. Refer to the home care instructions below. Severe dehydration needs to be treated at the hospital where you will probably be given intravenous (IV) fluids that contain water and electrolytes. HOME CARE INSTRUCTIONS   Ask your caregiver about specific rehydration instructions.  Drink enough fluids to keep your urine clear or pale yellow.  Drink small amounts frequently if you have nausea and vomiting.  Eat as you normally do.  Avoid:  Foods or drinks high in sugar.  Carbonated  drinks.  Juice.  Extremely hot or cold fluids.  Drinks with caffeine.  Fatty, greasy foods.  Alcohol.  Tobacco.  Overeating.  Gelatin desserts.  Wash your hands well to avoid spreading bacteria and viruses.  Only take over-the-counter or prescription medicines for pain, discomfort, or fever as directed by your caregiver.  Ask your caregiver if you should continue all prescribed and over-the-counter medicines.  Keep all follow-up appointments with your caregiver. SEEK MEDICAL CARE IF:  You have abdominal pain and it increases or stays in one area (localizes).  You have a rash, stiff neck, or severe headache.  You are irritable, sleepy, or difficult to awaken.  You are weak, dizzy, or extremely thirsty. SEEK IMMEDIATE MEDICAL CARE IF:   You are unable to keep fluids down or you get worse despite treatment.  You have frequent episodes of vomiting or diarrhea.  You have blood or green matter (bile) in your vomit.  You have blood in your stool or your stool looks black and tarry.  You have not urinated in 6 to 8 hours, or you have only urinated a small amount of very dark urine.  You have a fever.  You faint. MAKE SURE YOU:   Understand these instructions.  Will watch your condition.  Will get help right away if you are not doing well or get worse. Document Released: 01/31/2005 Document Revised: 04/25/2011 Document Reviewed: 09/20/2010 Exit[employer] Patient Information 2014 [employer].  

## 2012-12-24 ENCOUNTER — Ambulatory Visit (HOSPITAL_BASED_OUTPATIENT_CLINIC_OR_DEPARTMENT_OTHER): Payer: Medicare Other

## 2012-12-24 ENCOUNTER — Other Ambulatory Visit (HOSPITAL_BASED_OUTPATIENT_CLINIC_OR_DEPARTMENT_OTHER): Payer: Medicare Other

## 2012-12-24 ENCOUNTER — Ambulatory Visit (HOSPITAL_BASED_OUTPATIENT_CLINIC_OR_DEPARTMENT_OTHER): Payer: Medicare Other | Admitting: Adult Health

## 2012-12-24 ENCOUNTER — Encounter: Payer: Self-pay | Admitting: Adult Health

## 2012-12-24 VITALS — BP 116/71 | HR 80 | Temp 97.4°F | Resp 20 | Ht 66.0 in | Wt 185.9 lb

## 2012-12-24 DIAGNOSIS — Z5112 Encounter for antineoplastic immunotherapy: Secondary | ICD-10-CM

## 2012-12-24 DIAGNOSIS — K121 Other forms of stomatitis: Secondary | ICD-10-CM

## 2012-12-24 DIAGNOSIS — E860 Dehydration: Secondary | ICD-10-CM

## 2012-12-24 DIAGNOSIS — C50111 Malignant neoplasm of central portion of right female breast: Secondary | ICD-10-CM

## 2012-12-24 DIAGNOSIS — C50119 Malignant neoplasm of central portion of unspecified female breast: Secondary | ICD-10-CM

## 2012-12-24 DIAGNOSIS — Z170 Estrogen receptor positive status [ER+]: Secondary | ICD-10-CM

## 2012-12-24 LAB — COMPREHENSIVE METABOLIC PANEL (CC13)
Albumin: 3.2 g/dL — ABNORMAL LOW (ref 3.5–5.0)
Alkaline Phosphatase: 217 U/L — ABNORMAL HIGH (ref 40–150)
Anion Gap: 11 mEq/L (ref 3–11)
CO2: 22 mEq/L (ref 22–29)
Calcium: 9.4 mg/dL (ref 8.4–10.4)
Chloride: 107 mEq/L (ref 98–109)
Glucose: 173 mg/dl — ABNORMAL HIGH (ref 70–140)
Potassium: 3.9 mEq/L (ref 3.5–5.1)
Sodium: 139 mEq/L (ref 136–145)
Total Bilirubin: 0.42 mg/dL (ref 0.20–1.20)
Total Protein: 6.2 g/dL — ABNORMAL LOW (ref 6.4–8.3)

## 2012-12-24 LAB — CBC WITH DIFFERENTIAL/PLATELET
Basophils Absolute: 0.1 10*3/uL (ref 0.0–0.1)
EOS%: 0.2 % (ref 0.0–7.0)
Eosinophils Absolute: 0 10*3/uL (ref 0.0–0.5)
HCT: 34.8 % (ref 34.8–46.6)
HGB: 11.5 g/dL — ABNORMAL LOW (ref 11.6–15.9)
MCH: 30.1 pg (ref 25.1–34.0)
MONO#: 0.7 10*3/uL (ref 0.1–0.9)
NEUT#: 8.8 10*3/uL — ABNORMAL HIGH (ref 1.5–6.5)
NEUT%: 72.3 % (ref 38.4–76.8)
RBC: 3.82 10*6/uL (ref 3.70–5.45)
RDW: 13.9 % (ref 11.2–14.5)
lymph#: 2.6 10*3/uL (ref 0.9–3.3)

## 2012-12-24 MED ORDER — SODIUM CHLORIDE 0.9 % IV SOLN
Freq: Once | INTRAVENOUS | Status: AC
  Administered 2012-12-24: 11:00:00 via INTRAVENOUS

## 2012-12-24 MED ORDER — DIPHENHYDRAMINE HCL 25 MG PO CAPS
50.0000 mg | ORAL_CAPSULE | Freq: Once | ORAL | Status: AC
  Administered 2012-12-24: 50 mg via ORAL

## 2012-12-24 MED ORDER — ACETAMINOPHEN 325 MG PO TABS
ORAL_TABLET | ORAL | Status: AC
  Filled 2012-12-24: qty 2

## 2012-12-24 MED ORDER — HEPARIN SOD (PORK) LOCK FLUSH 100 UNIT/ML IV SOLN
500.0000 [IU] | Freq: Once | INTRAVENOUS | Status: AC | PRN
  Administered 2012-12-24: 500 [IU]
  Filled 2012-12-24: qty 5

## 2012-12-24 MED ORDER — ACETAMINOPHEN 325 MG PO TABS
650.0000 mg | ORAL_TABLET | Freq: Once | ORAL | Status: AC
  Administered 2012-12-24: 650 mg via ORAL

## 2012-12-24 MED ORDER — SODIUM CHLORIDE 0.9 % IJ SOLN
10.0000 mL | INTRAMUSCULAR | Status: DC | PRN
  Administered 2012-12-24: 10 mL
  Filled 2012-12-24: qty 10

## 2012-12-24 MED ORDER — DIPHENHYDRAMINE HCL 25 MG PO CAPS
ORAL_CAPSULE | ORAL | Status: AC
  Filled 2012-12-24: qty 2

## 2012-12-24 MED ORDER — TRASTUZUMAB CHEMO INJECTION 440 MG
2.0000 mg/kg | Freq: Once | INTRAVENOUS | Status: AC
  Administered 2012-12-24: 168 mg via INTRAVENOUS
  Filled 2012-12-24: qty 8

## 2012-12-24 NOTE — Patient Instructions (Signed)
Nausea medication  Decadron/Dexamethasone: take two tablets once the day prior to chemotherapy, then take two tablets twice a day for three days starting the day after chemotherapy.  Ondansetron/Zofran: take twice a day as needed starting the third day after chemotherapy  Prochlorperazine/Compazine: Take every 6 hours as needed for nausea  Lorazepam/Ativan: Take every 6 hours as needed for nausea  Prochlorperazine/Compazine suppository: Use daily as needed for vomiting if you cannot keep your oral medication down.     Fosaprepitant injection What is this medicine? FOSAPREPITANT (fos ap RE pi tant) is used together with other medicines to prevent nausea and vomiting caused by cancer treatment (chemotherapy). This medicine may be used for other purposes; ask your health care provider or pharmacist if you have questions. COMMON BRAND NAME(S): Emend What should I tell my health care provider before I take this medicine? They need to know if you have any of these conditions: -liver disease -an unusual or allergic reaction to fosaprepitant, aprepitant, medicines, foods, dyes, or preservatives -pregnant or trying to get pregnant -breast-feeding How should I use this medicine? This medicine is for injection into a vein. It is given by a health care professional in a hospital or clinic setting. Talk to your pediatrician regarding the use of this medicine in children. Special care may be needed. Overdosage: If you think you have taken too much of this medicine contact a poison control center or emergency room at once. NOTE: This medicine is only for you. Do not share this medicine with others. What if I miss a dose? This does not apply. What may interact with this medicine? Do not take this medicine with any of these medicines: -cisapride -pimozide -ranolazine This medicine may also interact with the following medications: -diltiazem -female hormones, like estrogens or progestins and  birth control pills -medicines for fungal infections like ketoconazole and itraconazole -medicines for HIV -medicines for seizures or to control epilepsy like carbamazepine or phenytoin -medicines used for sleep or anxiety disorders like alprazolam, diazepam, or midazolam -nefazodone -paroxetine -rifampin -some chemotherapy medications like etoposide, ifosfamide, vinblastine, vincristine -some antibiotics like clarithromycin, erythromycin, troleandomycin -steroid medicines like dexamethasone or methylprednisolone -tolbutamide -warfarin This list may not describe all possible interactions. Give your health care provider a list of all the medicines, herbs, non-prescription drugs, or dietary supplements you use. Also tell them if you smoke, drink alcohol, or use illegal drugs. Some items may interact with your medicine. What should I watch for while using this medicine? Do not take this medicine if you already have nausea and vomiting. Ask your health care provider what to do if you already have nausea. Birth control pills may not work properly while you are taking this medicine. Talk to your doctor about using an extra method of birth control. This medicine should not be used continuously for a long time. Visit your doctor or health care professional for regular check-ups. This medicine may change your liver function blood test results. What side effects may I notice from receiving this medicine? Side effects that you should report to your doctor or health care professional as soon as possible: -allergic reactions like skin rash, itching or hives, swelling of the face, lips, or tongue -breathing problems -changes in heart rhythm -high or low blood pressure -pain, redness, or irritation at site where injected -rectal bleeding -serious dizziness or disorientation, confusion -sharp or severe stomach pain -sharp pain in your leg Side effects that usually do not require medical attention  (report to your doctor or health  care professional if they continue or are bothersome): -constipation or diarrhea -hair loss -headache -hiccups -loss of appetite -nausea -upset stomach -tiredness This list may not describe all possible side effects. Call your doctor for medical advice about side effects. You may report side effects to FDA at 1-800-FDA-1088. Where should I keep my medicine? This drug is given in a hospital or clinic and will not be stored at home. NOTE: This sheet is a summary. It may not cover all possible information. If you have questions about this medicine, talk to your doctor, pharmacist, or health care provider.  2014, [employer] Standard. (2009-01-13 12:46:13)    Palonosetron Injection What is this medicine? PALONOSETRON (pal oh NOE se tron) is used to prevent nausea and vomiting caused by chemotherapy. It also helps prevent delayed nausea and vomiting that may occur a few days after your treatment. This medicine may be used for other purposes; ask your health care provider or pharmacist if you have questions. COMMON BRAND NAME(S): Aloxi What should I tell my health care provider before I take this medicine? They need to know if you have any of these conditions: -irregular heart rhythm -an unusual or allergic reaction to palonosetron, dolasetron, granisetron, ondansetron, other medicines, foods, dyes, or preservatives -pregnant or trying to get pregnant -breast-feeding How should I use this medicine? This medicine is for infusion into a vein. It is given by a health care professional in a hospital or clinic setting. Talk to your pediatrician regarding the use of this medicine in children. While this drug may be prescribed for children as young as 1 month for selected conditions, precautions do apply. Overdosage: If you think you have taken too much of this medicine contact a poison control center or emergency room at once. NOTE: This medicine is only for  you. Do not share this medicine with others. What if I miss a dose? This does not apply. What may interact with this medicine? Do not take this medicine with any of the following medications: -cisapride -dofetlide -dronedarone -droperidol -fluconazole -pimozide -posaconazole -thioridazine -ziprasidone This list may not describe all possible interactions. Give your health care provider a list of all the medicines, herbs, non-prescription drugs, or dietary supplements you use. Also tell them if you smoke, drink alcohol, or use illegal drugs. Some items may interact with your medicine. What should I watch for while using this medicine? Your condition will be monitored carefully while you are receiving this medicine. What side effects may I notice from receiving this medicine? Side effects that you should report to your doctor or health care professional as soon as possible: -allergic reactions like skin rash, itching or hives, swelling of the face, lips, or tongue -breathing problems -fast or irregular heartbeat -fever and chills -swelling of the hands and feet -tightness in the chest Side effects that usually do not require medical attention (report to your doctor or health care professional if they continue or are bothersome): -constipation or diarrhea -dizziness -headache This list may not describe all possible side effects. Call your doctor for medical advice about side effects. You may report side effects to FDA at 1-800-FDA-1088. Where should I keep my medicine? This drug is given in a hospital or clinic and will not be stored at home. NOTE: This sheet is a summary. It may not cover all possible information. If you have questions about this medicine, talk to your doctor, pharmacist, or health care provider.  2014, [employer] Standard. (2012-07-12 16:58:24)

## 2012-12-24 NOTE — Patient Instructions (Signed)
[facility] Discharge Instructions for Patients Receiving Chemotherapy  Today you received the following chemotherapy agents Herceptin.  To help prevent nausea and vomiting after your treatment, we encourage you to take your nausea medication as prescribed.   If you develop nausea and vomiting that is not controlled by your nausea medication, call the clinic.   BELOW ARE SYMPTOMS THAT SHOULD BE REPORTED IMMEDIATELY:  *FEVER GREATER THAN 100.5 F  *CHILLS WITH OR WITHOUT FEVER  NAUSEA AND VOMITING THAT IS NOT CONTROLLED WITH YOUR NAUSEA MEDICATION  *UNUSUAL SHORTNESS OF BREATH  *UNUSUAL BRUISING OR BLEEDING  TENDERNESS IN MOUTH AND THROAT WITH OR WITHOUT PRESENCE OF ULCERS  *URINARY PROBLEMS  *BOWEL PROBLEMS  UNUSUAL RASH Items with * indicate a potential emergency and should be followed up as soon as possible.  Feel free to call the clinic you have any questions or concerns. The clinic phone number is [phone].    

## 2012-12-24 NOTE — Progress Notes (Addendum)
OFFICE PROGRESS NOTE  CC**  Angela Garbe, MD [address] [facility], [place]. 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     PRIOR THERAPY: 1.  Patient palpated a mass in August 2014. She went on to have a mammogram performed that showed a 7 mm mass at the 12:00 position. The ultrasound showed the mass to be 7 mm) nipple. MRI showed a 1.7 cm irregular mass. Patient had a biopsy performed that showed invasive ductal carcinoma intermediate grade tumor was ER positive PR positive HER-2/neu positive with amplification of 2.96. Ki-67 was elevated at 45%.  2.  Patient underwent lumpectomy on 10/04/12 with Dr. Dwain Sarna he removed a 1.6cm invasive grade II ductal carcinoma with lymphovascular invasion.  0/5 lymph nodes were positive.   3.  Patient was supposed to start [facility] therapy 11/19/12, however she underwent surgery on her arm for a fracture due to fall on 11/06/12, therefore, she received three weeks of herceptin only starting on 10/6, with [facility]  Therapy will begin on 10/27.  CURRENT THERAPY:  TCH with weekly Herceptin cycle 1 day 15  INTERVAL HISTORY: DEE PADEN 72 y.o. female returns for followup visit today.  She has had a rough time with chemotherapy.  She describes to me that she felt extremely nauseated and unable to eat for a week and a half after treatment.  She and her friend who has accompanied her would like to know what else can be done to help prevent this.  She received IV fluids for 3 days last week and felt improved with this.  She is relieved that she is not nauseated anymore.  She did have a fever last week.  A urinalysis at her PCP was negative.  She was given Cipro x 1 week and continues to take this.  She has not had another fever since then.  Her eyes have been burning and tearing throughout the day.  She denies  fevers,c hills, nausea, vomiting, constipation, diarrhea, numbness or any other concerns.  Her blood glucose logs range from 106-221.    MEDICAL HISTORY: Past Medical History  Diagnosis Date  . Syncope   . Diastolic dysfunction   . Hyperlipidemia   . Episode of dizziness     Mild episodes  . Orthostatic hypotension     Some episodes  . Arthritis     oa  . Stress incontinence, female     wears pads  . AC (acromioclavicular) joint bone spurs     bone spurs in neck  . 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 Diabetes mellitus     niddm; last A1C 6.2  . Skin cancer   . Hypertension   . Wears glasses     ALLERGIES:  is allergic to oxycodone.  MEDICATIONS:  Current Outpatient Prescriptions  Medication Sig Dispense Refill  . Alpha-Lipoic Acid 600 MG CAPS Take 1 each by mouth daily.      . Alum & Mag Hydroxide-Simeth (MAGIC MOUTHWASH W/LIDOCAINE) SOLN Take 5 mLs by mouth 4 (four) times daily as needed. Swish and spit, do not swallow  100 mL  4  . carvedilol (COREG) 6.25 MG tablet Take 1 tablet (6.25 mg total) by mouth 2 (two) times daily with a meal.  180 tablet  3  . ciprofloxacin (CIPRO) 500 MG tablet Take 1 tablet (500 mg total) by  mouth 2 (two) times daily. For 7 days.  14 tablet  0  . dexamethasone (DECADRON) 4 MG tablet Take 2 tablets (8 mg total) by mouth 2 (two) times daily with a meal. Take two times a day the day before Taxotere. Then take two times a day starting the day after chemo for 3 days.  30 tablet  1  . glipiZIDE-metformin (METAGLIP) 2.5-500 MG per tablet Take 1 tablet by mouth every morning.       Marland Kitchen HYDROcodone-acetaminophen (NORCO) 5-325 MG per tablet 1-2 tabs po q6 hours prn pain  40 tablet  0  . HYDROcodone-acetaminophen (NORCO/VICODIN) 5-325 MG per tablet Take 1 tablet by mouth every 6 (six) hours as needed for pain.      Marland Kitchen lidocaine-prilocaine (EMLA) cream Apply topically as needed.  30 g  1  . LORazepam (ATIVAN) 0.5 MG tablet Take 1  tablet (0.5 mg total) by mouth every 6 (six) hours as needed (Nausea or vomiting).  30 tablet  0  . lovastatin (MEVACOR) 10 MG tablet Take 10 mg by mouth at bedtime.       . Multiple Vitamin (MULTIVITAMIN) capsule Take 1 capsule by mouth daily.      . ondansetron (ZOFRAN) 8 MG tablet Take 1 tablet (8 mg total) by mouth 2 (two) times daily. Take two times a day starting the day after chemo for 3 days. Then take two times a day as needed for nausea or vomiting.  30 tablet  1  . prochlorperazine (COMPAZINE) 10 MG tablet Take 1 tablet (10 mg total) by mouth every 6 (six) hours as needed (Nausea or vomiting).  30 tablet  1  . tretinoin (RETIN-A) 0.025 % cream Apply topically at bedtime.      Marland Kitchen UNABLE TO FIND Rx: L8015-Post Mastectomy Camisole (Quantity: 2); L8000-Post Surgical Bras (Quantity: 6) Dx: 174.9 Right partial mastectomy  1 each  0  . 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  1 Units  0   No current facility-administered medications for this visit.   Facility-Administered Medications Ordered in Other Visits  Medication Dose Route Frequency Provider Last Rate Last Dose  . gentamicin (GARAMYCIN) 160 mg in dextrose 5 % 50 mL IVPB  160 mg Intravenous 30 min Pre-Op Marcine Matar, MD        SURGICAL HISTORY:  Past Surgical History  Procedure Laterality Date  . Other surgical history      Hysterectomy  . Cholecystectomy  1992  . Abdominal hysterectomy  1985    1 ovary removed  . Total hip arthroplasty  02/23/2011    Procedure: TOTAL HIP ARTHROPLASTY;  Surgeon: Gus Rankin Aluisio;  Location: [facility];  Service: Orthopedics;  Laterality: Left;  . Pubovaginal sling  02/03/2012    Procedure: Leonides Grills;  Surgeon: Marcine Matar, MD;  Location: [facility];  Service: Urology;  Laterality: N/A;  1 HR  LYNX SLING  . Tonsillectomy    . Breast lumpectomy with needle localization and axillary sentinel lymph node bx Right 10/04/2012    Procedure:  RIGHT BREAST LUMPECTOMY WITH NEEDLE LOCALIZATION AND AXILLARY SENTINEL LYMPH NODE BX;  Surgeon: Emelia Loron, MD;  Location: [facility];  Service: General;  Laterality: Right;  . Portacath placement Left 10/04/2012    Procedure: INSERTION PORT-A-CATH;  Surgeon: Emelia Loron, MD;  Location: [facility];  Service: General;  Laterality: Left;  . Orif wrist fracture Left 11/06/2012    Procedure: LEFT OPEN REDUCTION INTERNAL FIXATION (  ORIF) DISTAL RADIUS WRIST FRACTURE;  Surgeon: Tami Ribas, MD;  Location: [facility];  Service: Orthopedics;  Laterality: Left;    REVIEW OF SYSTEMS:  A 10 point review of systems was conducted and is otherwise negative except for what is noted above.    PHYSICAL EXAMINATION: Blood pressure 116/71, pulse 80, temperature 97.4 F (36.3 C), temperature source Oral, resp. rate 20, height 5\' 6"  (1.676 m), weight 185 lb 14.4 oz (84.324 kg). Body mass index is 30.02 kg/(m^2). General: Patient is a well appearing female in no acute distress HEENT: PERRLA, sclerae anicteric no conjunctival pallor, MMM Neck: supple, no palpable adenopathy Lungs: clear to auscultation bilaterally, no wheezes, rhonchi, or rales Cardiovascular: regular rate rhythm, S1, S2, no murmurs, rubs or gallops Abdomen: Soft, non-tender, non-distended, normoactive bowel sounds, no HSM Extremities: warm and well perfused, no clubbing, cyanosis, or edema, left wrist scar is well healed. Skin: No rashes or lesions Neuro: Non-focal ECOG PERFORMANCE STATUS: 1 - Symptomatic but completely ambulatory  LABORATORY DATA: Lab Results  Component Value Date   WBC 12.2* 12/24/2012   HGB 11.5* 12/24/2012   HCT 34.8 12/24/2012   MCV 91.1 12/24/2012   PLT 248 12/24/2012      Chemistry      Component Value Date/Time   NA 134* 12/17/2012 0840   NA 135 11/01/2012 1350   K 3.6 12/17/2012 0840   K 4.2 11/01/2012 1350   CL 96 11/01/2012 1350   CO2 23 12/17/2012 0840    CO2 26 11/01/2012 1350   BUN 11.0 12/17/2012 0840   BUN 10 11/01/2012 1350   CREATININE 0.9 12/17/2012 0840   CREATININE 0.64 11/01/2012 1350      Component Value Date/Time   CALCIUM 10.0 12/17/2012 0840   CALCIUM 9.7 11/01/2012 1350   ALKPHOS 188* 12/17/2012 0840   ALKPHOS 77 02/17/2011 1045   AST 27 12/17/2012 0840   AST 26 02/17/2011 1045   ALT 235* 12/17/2012 0840   ALT 36* 02/17/2011 1045   BILITOT 1.05 12/17/2012 0840   BILITOT 0.5 02/17/2011 1045       RADIOGRAPHIC STUDIES:  Dg Forearm Left  10/30/2012   CLINICAL DATA:  Fall, forearm pain  EXAM: LEFT FOREARM - 2 VIEW  COMPARISON:  None.  FINDINGS: Two views of left forearm submitted. There is nondisplaced minimal impacted fracture in distal left radial metaphysis.  IMPRESSION: Nondisplaced minimally impacted fracture in distal left radial metaphysis.   Electronically Signed   By: Natasha Mead   On: 10/30/2012 18:20    ASSESSMENT: #1 palpable right breast mass at the 12:00 position measuring 7 mm ultrasound confirmed this mass close to the nipple. MRI revealed the mass to be irregular and measuring 1.7 cm not involving the skin. Biopsy revealed invasive ductal carcinoma intermediate grade ER positive PR positive HER-2/neu positive with a Ki-67 of 45%.   #2.  Patient underwent lumpectomy on 10/04/12 with Dr. Dwain Sarna he removed a 1.6cm invasive grade II ductal carcinoma with lymphovascular invasion.  0/5 lymph nodes were positive.   #3 patient is now ready to begin her chemotherapy consisting of Taxotere carboplatinum with Herceptin. She will begin this today 12/10/2012. She will receive day 2 Neulasta. We discussed how to take her antiemetics.  #4 mucositis  #5 dehydration   PLAN:  #1 Patient is feeling improved today.  She would like a plan to help alleviate the severe nausea and dehydration she experienced after the first cycle of TCH.  I ordered Aloxi and  Emend to be given with her next treatment due to this.  I typed out her  anti-emetics and have provided her with information regarding these medications in her AVS.  She will also get IV fluids the day of treatment and the day after when she comes in to receive her Neulasta.    #2 She will proceed with Herceptin today.  She continues to feel slightly dehydrated and would like IV fluids today with her treatment, which she will receive.  Her appetite is improved, I continued to encourage increased PO intake of fluids.    #3 She will return next week for labs and evaluation for chemotherapy.    All questions were answered. The patient knows to call the clinic with any problems, questions or concerns. We can certainly see the patient much sooner if necessary.  I spent 25 minutes counseling the patient face to face.  The total time spent in the appointment was 30 minutes.   Illa Level, NP Medical Oncology [facility] [phone]  12/24/2012, 10:05 AM    ATTENDING'S ATTESTATION:  I personally reviewed patient's chart, examined patient myself, formulated the treatment plan as followed.    Patient will proceed with her scheduled chemotherapy today. So far she is tolerating it reasonably well. We'll continue to follow her very closely.  Drue Second, MD Medical/Oncology [facility] [phone] (beeper) [phone] (Office)  01/14/2013, 3:07 AM

## 2012-12-25 ENCOUNTER — Telehealth: Payer: Self-pay | Admitting: Oncology

## 2012-12-25 NOTE — Telephone Encounter (Signed)
Added IVF's 11/18. S/w pt she is aware and will get new schedule 11/17.

## 2012-12-28 ENCOUNTER — Telehealth: Payer: Self-pay | Admitting: Oncology

## 2012-12-31 ENCOUNTER — Other Ambulatory Visit (HOSPITAL_BASED_OUTPATIENT_CLINIC_OR_DEPARTMENT_OTHER): Payer: Medicare Other

## 2012-12-31 ENCOUNTER — Other Ambulatory Visit: Payer: Self-pay | Admitting: *Deleted

## 2012-12-31 ENCOUNTER — Ambulatory Visit (HOSPITAL_BASED_OUTPATIENT_CLINIC_OR_DEPARTMENT_OTHER): Payer: Medicare Other

## 2012-12-31 ENCOUNTER — Ambulatory Visit (HOSPITAL_BASED_OUTPATIENT_CLINIC_OR_DEPARTMENT_OTHER): Payer: Medicare Other | Admitting: Family

## 2012-12-31 ENCOUNTER — Encounter: Payer: Self-pay | Admitting: Family

## 2012-12-31 VITALS — BP 125/73 | HR 80 | Temp 97.8°F | Resp 18 | Ht 66.0 in | Wt 183.6 lb

## 2012-12-31 DIAGNOSIS — E860 Dehydration: Secondary | ICD-10-CM

## 2012-12-31 DIAGNOSIS — C50111 Malignant neoplasm of central portion of right female breast: Secondary | ICD-10-CM

## 2012-12-31 DIAGNOSIS — C50119 Malignant neoplasm of central portion of unspecified female breast: Secondary | ICD-10-CM

## 2012-12-31 DIAGNOSIS — M899 Disorder of bone, unspecified: Secondary | ICD-10-CM

## 2012-12-31 DIAGNOSIS — Z5112 Encounter for antineoplastic immunotherapy: Secondary | ICD-10-CM

## 2012-12-31 DIAGNOSIS — Z5111 Encounter for antineoplastic chemotherapy: Secondary | ICD-10-CM

## 2012-12-31 DIAGNOSIS — Z171 Estrogen receptor negative status [ER-]: Secondary | ICD-10-CM

## 2012-12-31 DIAGNOSIS — R110 Nausea: Secondary | ICD-10-CM

## 2012-12-31 LAB — COMPREHENSIVE METABOLIC PANEL (CC13)
AST: 9 U/L (ref 5–34)
Alkaline Phosphatase: 127 U/L (ref 40–150)
BUN: 14.1 mg/dL (ref 7.0–26.0)
Calcium: 10.1 mg/dL (ref 8.4–10.4)
Chloride: 104 mEq/L (ref 98–109)
Creatinine: 0.8 mg/dL (ref 0.6–1.1)
Sodium: 139 mEq/L (ref 136–145)
Total Bilirubin: 0.35 mg/dL (ref 0.20–1.20)

## 2012-12-31 LAB — CBC WITH DIFFERENTIAL/PLATELET
BASO%: 0.1 % (ref 0.0–2.0)
EOS%: 0 % (ref 0.0–7.0)
HCT: 35.7 % (ref 34.8–46.6)
LYMPH%: 13.7 % — ABNORMAL LOW (ref 14.0–49.7)
MCH: 30.4 pg (ref 25.1–34.0)
MCHC: 33.3 g/dL (ref 31.5–36.0)
MCV: 91.3 fL (ref 79.5–101.0)
NEUT#: 12 10*3/uL — ABNORMAL HIGH (ref 1.5–6.5)
NEUT%: 80.6 % — ABNORMAL HIGH (ref 38.4–76.8)
Platelets: 462 10*3/uL — ABNORMAL HIGH (ref 145–400)
RDW: 14.4 % (ref 11.2–14.5)
nRBC: 0 % (ref 0–0)

## 2012-12-31 MED ORDER — DOCETAXEL CHEMO INJECTION 160 MG/16ML
75.0000 mg/m2 | Freq: Once | INTRAVENOUS | Status: AC
  Administered 2012-12-31: 150 mg via INTRAVENOUS
  Filled 2012-12-31: qty 15

## 2012-12-31 MED ORDER — ACETAMINOPHEN 325 MG PO TABS
650.0000 mg | ORAL_TABLET | Freq: Once | ORAL | Status: AC
  Administered 2012-12-31: 650 mg via ORAL

## 2012-12-31 MED ORDER — PALONOSETRON HCL INJECTION 0.25 MG/5ML
INTRAVENOUS | Status: AC
  Filled 2012-12-31: qty 5

## 2012-12-31 MED ORDER — SODIUM CHLORIDE 0.9 % IV SOLN
567.6000 mg | Freq: Once | INTRAVENOUS | Status: AC
  Administered 2012-12-31: 570 mg via INTRAVENOUS
  Filled 2012-12-31: qty 57

## 2012-12-31 MED ORDER — DEXAMETHASONE SODIUM PHOSPHATE 20 MG/5ML IJ SOLN
12.0000 mg | Freq: Once | INTRAMUSCULAR | Status: AC
  Administered 2012-12-31: 12 mg via INTRAVENOUS

## 2012-12-31 MED ORDER — ACETAMINOPHEN 325 MG PO TABS
ORAL_TABLET | ORAL | Status: AC
  Filled 2012-12-31: qty 2

## 2012-12-31 MED ORDER — HEPARIN SOD (PORK) LOCK FLUSH 100 UNIT/ML IV SOLN
500.0000 [IU] | Freq: Once | INTRAVENOUS | Status: AC | PRN
  Administered 2012-12-31: 500 [IU]
  Filled 2012-12-31: qty 5

## 2012-12-31 MED ORDER — DIPHENHYDRAMINE HCL 25 MG PO CAPS
ORAL_CAPSULE | ORAL | Status: AC
  Filled 2012-12-31: qty 2

## 2012-12-31 MED ORDER — DEXAMETHASONE SODIUM PHOSPHATE 20 MG/5ML IJ SOLN
INTRAMUSCULAR | Status: AC
  Filled 2012-12-31: qty 5

## 2012-12-31 MED ORDER — TRASTUZUMAB CHEMO INJECTION 440 MG
2.0000 mg/kg | Freq: Once | INTRAVENOUS | Status: AC
  Administered 2012-12-31: 168 mg via INTRAVENOUS
  Filled 2012-12-31: qty 8

## 2012-12-31 MED ORDER — SODIUM CHLORIDE 0.9 % IV SOLN
Freq: Once | INTRAVENOUS | Status: AC
  Administered 2012-12-31: 12:00:00 via INTRAVENOUS

## 2012-12-31 MED ORDER — PALONOSETRON HCL INJECTION 0.25 MG/5ML
0.2500 mg | Freq: Once | INTRAVENOUS | Status: AC
  Administered 2012-12-31: 0.25 mg via INTRAVENOUS

## 2012-12-31 MED ORDER — DIPHENHYDRAMINE HCL 25 MG PO CAPS
50.0000 mg | ORAL_CAPSULE | Freq: Once | ORAL | Status: AC
  Administered 2012-12-31: 50 mg via ORAL

## 2012-12-31 MED ORDER — SODIUM CHLORIDE 0.9 % IV SOLN
150.0000 mg | Freq: Once | INTRAVENOUS | Status: AC
  Administered 2012-12-31: 150 mg via INTRAVENOUS
  Filled 2012-12-31: qty 5

## 2012-12-31 MED ORDER — SODIUM CHLORIDE 0.9 % IJ SOLN
10.0000 mL | INTRAMUSCULAR | Status: DC | PRN
  Administered 2012-12-31: 10 mL
  Filled 2012-12-31: qty 10

## 2012-12-31 NOTE — Patient Instructions (Signed)
[facility] Discharge Instructions for Patients Receiving Chemotherapy  Today you received the following chemotherapy agents Herceptin/Taxotere/Carboplatin To help prevent nausea and vomiting after your treatment, we encourage you to take your nausea medication as prescribed. If you develop nausea and vomiting that is not controlled by your nausea medication, call the clinic.   BELOW ARE SYMPTOMS THAT SHOULD BE REPORTED IMMEDIATELY:  *FEVER GREATER THAN 100.5 F  *CHILLS WITH OR WITHOUT FEVER  NAUSEA AND VOMITING THAT IS NOT CONTROLLED WITH YOUR NAUSEA MEDICATION  *UNUSUAL SHORTNESS OF BREATH  *UNUSUAL BRUISING OR BLEEDING  TENDERNESS IN MOUTH AND THROAT WITH OR WITHOUT PRESENCE OF ULCERS  *URINARY PROBLEMS  *BOWEL PROBLEMS  UNUSUAL RASH Items with * indicate a potential emergency and should be followed up as soon as possible.  Feel free to call the clinic you have any questions or concerns. The clinic phone number is [phone].    

## 2012-12-31 NOTE — Progress Notes (Signed)
[facility]  Telephone:([phone]) [phone] [phone]  OFFICE PROGRESS NOTE  ID: Angela Rosario   DOB: 1940-02-25  MR#: [id number]  JYN#:[id number]   PCP: [name], MD SU: Emelia Loron, M.D.   DIAGNOSIS:  Angela Rosario is a 72 y.o. female diagnosed in 08/2012 with stage I, invasive ductal carcinoma of the right breast, estrogen receptor positive, progesterone receptor positive, HER-2/neu positive with Ki-67 of 45%.    PRIOR THERAPY: 1.  Cyndy Freeze herself palpated a mass approximately 6 months after her annual mammogram.  Diagnostic mammogram on 09/11/2012 showed a 7 mm mass at the 12 o'clock position. Ultrasound showed the mass to be 7 mm at the 12 o'clock position.  Right breast needle core biopsy of the 12 o'clock position on 09/12/2012  showed invasive mammary carcinoma with lobular features, grade 2, estrogen receptor 100% positive, progesterone receptor 100% positive, Ki-67 45%, HER-2/neu by CISH showed amplification with ratio 2.96.  Bilateral breast MRI on 09/18/2012 showed a 1.5 x 1.7 x 1.5 cm irregular mass in the right breast with no mass or abnormal enhancements in the left breast or lymph nodes (clinical stage I, T1 N0).   2.  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  3.  Adjuvant chemotherapy consisting of TCH (Taxotere/Carboplatin/Herceptin) originally scheduled to start on 11/19/2012 however the patient suffered a fall on 11/06/2012 and subsequently underwent arm fracture repair surgery.  She received three weeks of Herceptin therapy that started on 11/19/2012 and adjuvant chemotherapy consisting of TCH started on 12/10/2012.   CURRENT THERAPY:  Adjuvant chemotherapy consisting of Taxotere/Carboplatin administered in 21 day cycles, with day 2 Neulasta injection and adjuvant immunotherapy consisting of weekly Herceptin.  Six cycles of Taxotere/Carboplatin are planned with Herceptin therapy plan to continue for one year.   INTERVAL HISTORY: Angela Rosario is a 72 y.o. female who returns for followup.  She is accompanied for today's office visit by her sister Eber Jones.  Since her last office visit on 12/24/2012, she states she has felt pretty good with the exception of some nausea and she experienced bony pain in her hands, feet and her right leg after Neulasta injection.  We went over her medications in detail and she was provided written instructions on how to take her antinausea medications and medications on the day of and for 3-5 days after receiving Neulasta injection (Claritin daily and Tylenol or Aleve as directed).  Ms. Festa was also encouraged to drink more water between chemotherapy cycles.  We are currently administering 1000 mL IV fluids on each Taxotere/Carboplatin chemotherapy cycle.  Her interval history is otherwise stable and unremarkable.   MEDICAL HISTORY: Past Medical History  Diagnosis Date  . Syncope   . Diastolic dysfunction   . Hyperlipidemia   . Episode of dizziness     Mild episodes  . Orthostatic hypotension     Some episodes  . Arthritis     oa  . Stress incontinence, female     wears pads  . AC (acromioclavicular) joint bone spurs     bone spurs in neck  . Retinitis pigmentosa  poor peripheral vision both eyes  . Diabetes mellitus     niddm; last A1C 6.2  . Skin cancer   . Hypertension   . Wears glasses     ALLERGIES:   Allergies  Allergen Reactions  . Oxycodone Other (See Comments)    hallucinations    MEDICATIONS:  Current  Outpatient Prescriptions  Medication Sig Dispense Refill  . Alpha-Lipoic Acid 600 MG CAPS Take 1 each by mouth daily.      . Alum & Mag Hydroxide-Simeth (MAGIC MOUTHWASH W/LIDOCAINE) SOLN Take 5 mLs by mouth 4 (four) times daily as needed. Swish and spit, do not swallow  100 mL  4  . carvedilol (COREG) 6.25 MG tablet Take 1 tablet (6.25 mg total) by mouth 2 (two) times daily with a meal.  180 tablet  3  . dexamethasone (DECADRON) 4 MG tablet Take 2 tablets (8 mg total) by mouth 2 (two) times daily with a meal. Take two times a day the day before Taxotere. Then take two times a day starting the day after chemo for 3 days.  30 tablet  1  . HYDROcodone-acetaminophen (NORCO) 5-325 MG per tablet 1-2 tabs po q6 hours prn pain  40 tablet  0  . lidocaine-prilocaine (EMLA) cream Apply topically as needed.  30 g  1  . LORazepam (ATIVAN) 0.5 MG tablet Take 1 tablet (0.5 mg total) by mouth every 6 (six) hours as needed (Nausea or vomiting).  30 tablet  0  . lovastatin (MEVACOR) 10 MG tablet Take 10 mg by mouth at bedtime.       . Multiple Vitamin (MULTIVITAMIN) capsule Take 1 capsule by mouth daily.      . ondansetron (ZOFRAN) 8 MG tablet Take 1 tablet (8 mg total) by mouth 2 (two) times daily. Take two times a day starting the day after chemo for 3 days. Then take two times a day as needed for nausea or vomiting.  30 tablet  1  . prochlorperazine (COMPAZINE) 10 MG tablet Take 1 tablet (10 mg total) by mouth every 6 (six) hours as needed (Nausea or vomiting).  30 tablet  1  . tretinoin (RETIN-A) 0.025 % cream Apply topically at bedtime.      Marland Kitchen UNABLE TO FIND Rx: L8015-Post Mastectomy Camisole (Quantity: 2); L8000-Post Surgical Bras (Quantity: 6) Dx: 174.9 Right partial mastectomy  1 each  0  . 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  1 Units  0  . ciprofloxacin (CIPRO) 500 MG tablet Take 1 tablet (500 mg total) by mouth 2 (two) times daily. For 7 days.  14 tablet  0  .  glipiZIDE-metformin (METAGLIP) 2.5-500 MG per tablet Take 1 tablet by mouth every morning.       . glyBURIDE-metformin (GLUCOVANCE) 2.5-500 MG per tablet Take 1 tablet by mouth daily with breakfast.        No current facility-administered medications for this visit.   Facility-Administered Medications Ordered in Other Visits  Medication Dose Route Frequency Provider Last Rate Last Dose  . gentamicin (GARAMYCIN) 160 mg in dextrose 5 % 50 mL IVPB  160 mg Intravenous 30 min Pre-Op Marcine Matar, MD        SURGICAL HISTORY:  Past Surgical History  Procedure Laterality Date  . Other surgical history      Hysterectomy  . Cholecystectomy  1992  . Abdominal hysterectomy  1985    1 ovary removed  . Total hip arthroplasty  02/23/2011    Procedure: TOTAL  HIP ARTHROPLASTY;  Surgeon: Loanne Drilling;  Location: [facility];  Service: Orthopedics;  Laterality: Left;  . Pubovaginal sling  02/03/2012    Procedure: Leonides Grills;  Surgeon: Marcine Matar, MD;  Location: [facility];  Service: Urology;  Laterality: N/A;  1 HR  LYNX SLING  . Tonsillectomy    . Breast lumpectomy with needle localization and axillary sentinel lymph node bx Right 10/04/2012    Procedure: RIGHT BREAST LUMPECTOMY WITH NEEDLE LOCALIZATION AND AXILLARY SENTINEL LYMPH NODE BX;  Surgeon: Emelia Loron, MD;  Location: [facility];  Service: General;  Laterality: Right;  . Portacath placement Left 10/04/2012    Procedure: INSERTION PORT-A-CATH;  Surgeon: Emelia Loron, MD;  Location: [facility];  Service: General;  Laterality: Left;  . Orif wrist fracture Left 11/06/2012    Procedure: LEFT OPEN REDUCTION INTERNAL FIXATION (ORIF) DISTAL RADIUS WRIST FRACTURE;  Surgeon: Tami Ribas, MD;  Location: [facility];  Service: Orthopedics;  Laterality: Left;    REVIEW OF SYSTEMS:  A 10 point review of systems was completed except as noted above.  Ms. Stofer denies any  other symptomatology including fatigue, fever or chills, headache, vision changes, swollen glands, cough or shortness of breath, chest pain or discomfort, vomiting, diarrhea, constipation, change in urinary or bowel habits, any other arthralgias/myalgias, unusual bleeding/bruising or any other symptomatology.   PHYSICAL EXAMINATION: Blood pressure 125/73, pulse 80, temperature 97.8 F (36.6 C), temperature source Oral, resp. rate 18, height 5\' 6"  (1.676 m), weight 183 lb 9.6 oz (83.28 kg). Body mass index is 29.65 kg/(m^2).  ECOG PERFORMANCE STATUS: 1 - Symptomatic but completely ambulatory  General appearance: Alert, cooperative, well nourished, no apparent distress Head: Normocephalic, without obvious abnormality, atraumatic, the patient is wearing a wig Eyes: Arcus senilis, PERRLA, EOMI Nose: Nares, septum and mucosa are normal, no drainage or sinus tenderness Neck: No adenopathy, supple, symmetrical, trachea midline, no tenderness Resp: Clear to auscultation bilaterally, no wheezes/rales/rhonchi Cardio: Regular rate and rhythm, S1, S2 normal, no murmur, click, rub or gallop, no edema, left chest Port-A-Cath covered in EMLA cream with an occlusive dressing Breasts:  Deferred GI: Soft, not distended, non-tender, hypoactive bowel sounds, no organomegaly Skin: No rashes/lesions, skin warm and dry, no erythematous areas, no cyanosis  M/S:  Atraumatic, normal strength in all extremities, normal range of motion, no clubbing  Lymph nodes: Cervical, supraclavicular, and axillary nodes normal Neurologic: Grossly normal, cranial nerves II through XII intact, alert and oriented x 3 Psych: Appropriate affect   LABORATORY DATA: Lab Results  Component Value Date   WBC 14.9* 12/31/2012   HGB 11.9 12/31/2012   HCT 35.7 12/31/2012   MCV 91.3 12/31/2012   PLT 462* 12/31/2012      Chemistry      Component Value Date/Time   NA 139 12/31/2012 0956   NA 135 11/01/2012 1350   K 4.1 12/31/2012 0956     K 4.2 11/01/2012 1350   CL 96 11/01/2012 1350   CO2 24 12/31/2012 0956   CO2 26 11/01/2012 1350   BUN 14.1 12/31/2012 0956   BUN 10 11/01/2012 1350   CREATININE 0.8 12/31/2012 0956   CREATININE 0.64 11/01/2012 1350      Component Value Date/Time   CALCIUM 10.1 12/31/2012 0956   CALCIUM 9.7 11/01/2012 1350   ALKPHOS 127 12/31/2012 0956   ALKPHOS 77 02/17/2011 1045   AST 9 12/31/2012 0956   AST 26 02/17/2011 1045   ALT 15 12/31/2012 0956  ALT 36* 02/17/2011 1045   BILITOT 0.35 12/31/2012 0956   BILITOT 0.5 02/17/2011 1045       RADIOGRAPHIC STUDIES: No results found.   ASSESSMENT:  RHILEE CURRIN is a 72 y.o. female:  1.  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  2.  Adjuvant chemotherapy consisting of TCH (Taxotere/Carboplatin/Herceptin) originally scheduled to start on 11/19/2012 however the patient suffered a fall on 11/06/2012 and subsequently underwent arm fracture repair surgery. She received three weeks of Herceptin therapy that started on 11/19/2012 and adjuvant chemotherapy consisting of TCH started on 12/10/2012.  3.  Dehydration  PLAN:  1.  Ms. Mccutchen will proceed today to adjuvant chemotherapy cycle #2 today consisting of TCH (Taxotere/carboplatin/Herceptin) with a Neulasta injection scheduled for 01/01/2013.  We went over her home medications extensively and she was provided with written medication administration instructions.    2.  Ms. Figiel was asked to consume more water between chemotherapy cycles, and we will continue  to minister IVF with each [facility] chemotherapy cycle.  3.  We plan to see Ms. Compean again on 01/07/2013 for Herceptin infusion.  She is scheduled for genetic counseling/testing on 01/24/2013.  All questions answered.  Ms. Mosher and her sister Eber Jones were encouraged to contact us in the interim with any questions, concerns, or problems.  Larina Bras, NP-C 01/02/2013  10:54 AM

## 2012-12-31 NOTE — Patient Instructions (Addendum)
Please contact us at [phone] if you have any questions or concerns.  Please continue to do well and enjoy life!!!  Get plenty of rest, drink plenty of water, exercise daily (walking), eat a balanced diet.  Complete monthly self-breast examinations.  Have a clinical breast exam by a physician every year.  Results for orders placed in visit on 12/31/12 (from the past 24 hour(s))  CBC WITH DIFFERENTIAL     Status: Abnormal   Collection Time    12/31/12  9:56 AM      Result Value Range   WBC 14.9 (*) 3.9 - 10.3 10e3/uL   NEUT# 12.0 (*) 1.5 - 6.5 10e3/uL   HGB 11.9  11.6 - 15.9 g/dL   HCT 16.1  09.6 - 04.5 %   Platelets 462 (*) 145 - 400 10e3/uL   MCV 91.3  79.5 - 101.0 fL   MCH 30.4  25.1 - 34.0 pg   MCHC 33.3  31.5 - 36.0 g/dL   RBC 4.09  8.11 - 9.14 10e6/uL   RDW 14.4  11.2 - 14.5 %   lymph# 2.1  0.9 - 3.3 10e3/uL   MONO# 0.8  0.1 - 0.9 10e3/uL   Eosinophils Absolute 0.0  0.0 - 0.5 10e3/uL   Basophils Absolute 0.0  0.0 - 0.1 10e3/uL   NEUT% 80.6 (*) 38.4 - 76.8 %   LYMPH% 13.7 (*) 14.0 - 49.7 %   MONO% 5.6  0.0 - 14.0 %   EOS% 0.0  0.0 - 7.0 %   BASO% 0.1  0.0 - 2.0 %   nRBC 0  0 - 0 %   Narrative:    Performed At:  [facility]               [address]               [place], [place] [address]   Medications:  Decadron/Dexamethasone: Take 2 tablets (8 mg) by mouth 2 times daily (as prescribed) with a meal.  Take this medication the day before chemotherapy, and then take this medication the day after chemotherapy for 3 more days.  Ondansetron/Zofran:  Take 1 tablet (8 mg) by mouth 2 times daily.  Take this medication twice a day starting the day after chemotherapy for 3 days.  Then take this medication 2 times a day as needed for nausea or vomiting.  Prochlorperazine/Compazine: Take 1 tablet (10 mg) by mouth every 6 hours as needed for nausea or vomiting.  Lorazepam/Ativan: Take 1 tablet (0.5 mg) by mouth every 6 hours as needed for nausea or vomiting.   May cause drowsiness.  Take Claritin daily plus Aleve or Tylenol (as directed) on day of Neulasta injection and for 3-5 days following Neulasta injection.

## 2013-01-01 ENCOUNTER — Ambulatory Visit (HOSPITAL_BASED_OUTPATIENT_CLINIC_OR_DEPARTMENT_OTHER): Payer: Medicare Other

## 2013-01-01 ENCOUNTER — Ambulatory Visit: Payer: Medicare Other

## 2013-01-01 VITALS — BP 121/66 | HR 68 | Temp 97.0°F | Resp 19 | Ht 66.0 in

## 2013-01-01 DIAGNOSIS — E860 Dehydration: Secondary | ICD-10-CM

## 2013-01-01 DIAGNOSIS — C50119 Malignant neoplasm of central portion of unspecified female breast: Secondary | ICD-10-CM

## 2013-01-01 DIAGNOSIS — C50111 Malignant neoplasm of central portion of right female breast: Secondary | ICD-10-CM

## 2013-01-01 MED ORDER — PEGFILGRASTIM INJECTION 6 MG/0.6ML
6.0000 mg | Freq: Once | SUBCUTANEOUS | Status: AC
  Administered 2013-01-01: 6 mg via SUBCUTANEOUS
  Filled 2013-01-01: qty 0.6

## 2013-01-01 MED ORDER — SODIUM CHLORIDE 0.9 % IJ SOLN
10.0000 mL | Freq: Once | INTRAMUSCULAR | Status: AC
  Administered 2013-01-01: 10 mL via INTRAVENOUS
  Filled 2013-01-01: qty 10

## 2013-01-01 MED ORDER — SODIUM CHLORIDE 0.9 % IV SOLN
Freq: Once | INTRAVENOUS | Status: AC
  Administered 2013-01-01: 10:00:00 via INTRAVENOUS

## 2013-01-01 MED ORDER — HEPARIN SOD (PORK) LOCK FLUSH 100 UNIT/ML IV SOLN
500.0000 [IU] | Freq: Once | INTRAVENOUS | Status: AC
  Administered 2013-01-01: 500 [IU] via INTRAVENOUS
  Filled 2013-01-01: qty 5

## 2013-01-01 NOTE — Patient Instructions (Signed)

## 2013-01-04 ENCOUNTER — Other Ambulatory Visit: Payer: Self-pay | Admitting: *Deleted

## 2013-01-04 ENCOUNTER — Telehealth: Payer: Self-pay | Admitting: *Deleted

## 2013-01-04 DIAGNOSIS — E860 Dehydration: Secondary | ICD-10-CM

## 2013-01-04 MED ORDER — SODIUM CHLORIDE 0.9 % IV SOLN: 1000.0000 mL | INTRAVENOUS | Status: DC

## 2013-01-04 NOTE — Telephone Encounter (Signed)
Pt's sister called states she is requesting IVF on Saturday. Reviewed with MD, Spoke with infusion RN, scheduled pt for IVF 01/05/13 at 0845. Orders in. Pt aware. No further concerns.

## 2013-01-04 NOTE — Addendum Note (Signed)
Addended by: Mardi Mainland on: 01/04/2013 04:04 PM   Modules accepted: Orders

## 2013-01-05 ENCOUNTER — Other Ambulatory Visit: Payer: Self-pay | Admitting: *Deleted

## 2013-01-05 ENCOUNTER — Ambulatory Visit (HOSPITAL_BASED_OUTPATIENT_CLINIC_OR_DEPARTMENT_OTHER): Payer: Medicare Other

## 2013-01-05 VITALS — BP 129/80 | HR 111 | Temp 98.9°F | Resp 20

## 2013-01-05 DIAGNOSIS — C50119 Malignant neoplasm of central portion of unspecified female breast: Secondary | ICD-10-CM

## 2013-01-05 DIAGNOSIS — E860 Dehydration: Secondary | ICD-10-CM

## 2013-01-05 MED ORDER — SODIUM CHLORIDE 0.9 % IV SOLN
1000.0000 mL | INTRAVENOUS | Status: AC
  Administered 2013-01-05: 1000 mL via INTRAVENOUS

## 2013-01-05 MED ORDER — HEPARIN SOD (PORK) LOCK FLUSH 100 UNIT/ML IV SOLN
500.0000 [IU] | Freq: Once | INTRAVENOUS | Status: AC
  Administered 2013-01-05: 500 [IU] via INTRAVENOUS
  Filled 2013-01-05: qty 5

## 2013-01-05 MED ORDER — SODIUM CHLORIDE 0.9 % IJ SOLN
10.0000 mL | Freq: Once | INTRAMUSCULAR | Status: AC
  Administered 2013-01-05: 10 mL via INTRAVENOUS
  Filled 2013-01-05: qty 10

## 2013-01-05 NOTE — Patient Instructions (Signed)
Dehydration, Adult Dehydration is when you lose more fluids from the body than you take in. Vital organs like the kidneys, brain, and heart cannot function without a proper amount of fluids and salt. Any loss of fluids from the body can cause dehydration.  CAUSES   Vomiting.  Diarrhea.  Excessive sweating.  Excessive urine output.  Fever. SYMPTOMS  Mild dehydration  Thirst.  Dry lips.  Slightly dry mouth. Moderate dehydration  Very dry mouth.  Sunken eyes.  Skin does not bounce back quickly when lightly pinched and released.  Dark urine and decreased urine production.  Decreased tear production.  Headache. Severe dehydration  Very dry mouth.  Extreme thirst.  Rapid, weak pulse (more than 100 beats per minute at rest).  Cold hands and feet.  Not able to sweat in spite of heat and temperature.  Rapid breathing.  Blue lips.  Confusion and lethargy.  Difficulty being awakened.  Minimal urine production.  No tears. DIAGNOSIS  Your caregiver will diagnose dehydration based on your symptoms and your exam. Blood and urine tests will help confirm the diagnosis. The diagnostic evaluation should also identify the cause of dehydration. TREATMENT  Treatment of mild or moderate dehydration can often be done at home by increasing the amount of fluids that you drink. It is best to drink small amounts of fluid more often. Drinking too much at one time can make vomiting worse. Refer to the home care instructions below. Severe dehydration needs to be treated at the hospital where you will probably be given intravenous (IV) fluids that contain water and electrolytes. HOME CARE INSTRUCTIONS   Ask your caregiver about specific rehydration instructions.  Drink enough fluids to keep your urine clear or pale yellow.  Drink small amounts frequently if you have nausea and vomiting.  Eat as you normally do.  Avoid:  Foods or drinks high in sugar.  Carbonated  drinks.  Juice.  Extremely hot or cold fluids.  Drinks with caffeine.  Fatty, greasy foods.  Alcohol.  Tobacco.  Overeating.  Gelatin desserts.  Wash your hands well to avoid spreading bacteria and viruses.  Only take over-the-counter or prescription medicines for pain, discomfort, or fever as directed by your caregiver.  Ask your caregiver if you should continue all prescribed and over-the-counter medicines.  Keep all follow-up appointments with your caregiver. SEEK MEDICAL CARE IF:  You have abdominal pain and it increases or stays in one area (localizes).  You have a rash, stiff neck, or severe headache.  You are irritable, sleepy, or difficult to awaken.  You are weak, dizzy, or extremely thirsty. SEEK IMMEDIATE MEDICAL CARE IF:   You are unable to keep fluids down or you get worse despite treatment.  You have frequent episodes of vomiting or diarrhea.  You have blood or green matter (bile) in your vomit.  You have blood in your stool or your stool looks black and tarry.  You have not urinated in 6 to 8 hours, or you have only urinated a small amount of very dark urine.  You have a fever.  You faint. MAKE SURE YOU:   Understand these instructions.  Will watch your condition.  Will get help right away if you are not doing well or get worse. Document Released: 01/31/2005 Document Revised: 04/25/2011 Document Reviewed: 09/20/2010 Exit[employer] Patient Information 2014 [employer].  

## 2013-01-07 ENCOUNTER — Ambulatory Visit (HOSPITAL_BASED_OUTPATIENT_CLINIC_OR_DEPARTMENT_OTHER): Payer: Medicare Other | Admitting: Adult Health

## 2013-01-07 ENCOUNTER — Encounter: Payer: Self-pay | Admitting: Adult Health

## 2013-01-07 ENCOUNTER — Telehealth: Payer: Self-pay | Admitting: Oncology

## 2013-01-07 ENCOUNTER — Ambulatory Visit (HOSPITAL_BASED_OUTPATIENT_CLINIC_OR_DEPARTMENT_OTHER): Payer: Medicare Other

## 2013-01-07 ENCOUNTER — Telehealth: Payer: Self-pay | Admitting: *Deleted

## 2013-01-07 ENCOUNTER — Other Ambulatory Visit (HOSPITAL_BASED_OUTPATIENT_CLINIC_OR_DEPARTMENT_OTHER): Payer: Medicare Other | Admitting: Lab

## 2013-01-07 VITALS — BP 144/84 | HR 80 | Temp 97.8°F | Resp 18 | Ht 66.0 in | Wt 177.8 lb

## 2013-01-07 DIAGNOSIS — R110 Nausea: Secondary | ICD-10-CM

## 2013-01-07 DIAGNOSIS — Z5112 Encounter for antineoplastic immunotherapy: Secondary | ICD-10-CM

## 2013-01-07 DIAGNOSIS — E860 Dehydration: Secondary | ICD-10-CM

## 2013-01-07 DIAGNOSIS — C50111 Malignant neoplasm of central portion of right female breast: Secondary | ICD-10-CM

## 2013-01-07 DIAGNOSIS — C50119 Malignant neoplasm of central portion of unspecified female breast: Secondary | ICD-10-CM

## 2013-01-07 DIAGNOSIS — Z170 Estrogen receptor positive status [ER+]: Secondary | ICD-10-CM

## 2013-01-07 LAB — CBC WITH DIFFERENTIAL/PLATELET
BASO%: 0.2 % (ref 0.0–2.0)
Basophils Absolute: 0 10*3/uL (ref 0.0–0.1)
EOS%: 1.8 % (ref 0.0–7.0)
Eosinophils Absolute: 0.2 10*3/uL (ref 0.0–0.5)
HGB: 12.6 g/dL (ref 11.6–15.9)
MCH: 30 pg (ref 25.1–34.0)
NEUT#: 8.2 10*3/uL — ABNORMAL HIGH (ref 1.5–6.5)
RDW: 14.6 % — ABNORMAL HIGH (ref 11.2–14.5)
lymph#: 2.7 10*3/uL (ref 0.9–3.3)

## 2013-01-07 LAB — COMPREHENSIVE METABOLIC PANEL (CC13)
ALT: 54 U/L (ref 0–55)
AST: 17 U/L (ref 5–34)
Albumin: 3.1 g/dL — ABNORMAL LOW (ref 3.5–5.0)
BUN: 10.5 mg/dL (ref 7.0–26.0)
CO2: 22 mEq/L (ref 22–29)
Calcium: 8.4 mg/dL (ref 8.4–10.4)
Chloride: 102 mEq/L (ref 98–109)
Potassium: 3.4 mEq/L — ABNORMAL LOW (ref 3.5–5.1)
Sodium: 138 mEq/L (ref 136–145)
Total Protein: 5.5 g/dL — ABNORMAL LOW (ref 6.4–8.3)

## 2013-01-07 MED ORDER — ACETAMINOPHEN 325 MG PO TABS
650.0000 mg | ORAL_TABLET | Freq: Once | ORAL | Status: AC
  Administered 2013-01-07: 650 mg via ORAL

## 2013-01-07 MED ORDER — PALONOSETRON HCL INJECTION 0.25 MG/5ML
INTRAVENOUS | Status: AC
  Filled 2013-01-07: qty 5

## 2013-01-07 MED ORDER — TRASTUZUMAB CHEMO INJECTION 440 MG
2.0000 mg/kg | Freq: Once | INTRAVENOUS | Status: AC
  Administered 2013-01-07: 168 mg via INTRAVENOUS
  Filled 2013-01-07: qty 8

## 2013-01-07 MED ORDER — SODIUM CHLORIDE 0.9 % IV SOLN
Freq: Once | INTRAVENOUS | Status: AC
  Administered 2013-01-07: 13:00:00 via INTRAVENOUS

## 2013-01-07 MED ORDER — HEPARIN SOD (PORK) LOCK FLUSH 100 UNIT/ML IV SOLN
500.0000 [IU] | Freq: Once | INTRAVENOUS | Status: AC | PRN
  Administered 2013-01-07: 500 [IU]
  Filled 2013-01-07: qty 5

## 2013-01-07 MED ORDER — PALONOSETRON HCL INJECTION 0.25 MG/5ML
0.2500 mg | Freq: Once | INTRAVENOUS | Status: AC
  Administered 2013-01-07: 0.25 mg via INTRAVENOUS

## 2013-01-07 MED ORDER — ACETAMINOPHEN 325 MG PO TABS
ORAL_TABLET | ORAL | Status: AC
  Filled 2013-01-07: qty 2

## 2013-01-07 MED ORDER — SODIUM CHLORIDE 0.9 % IJ SOLN
10.0000 mL | INTRAMUSCULAR | Status: DC | PRN
  Administered 2013-01-07: 10 mL
  Filled 2013-01-07: qty 10

## 2013-01-07 MED ORDER — SODIUM CHLORIDE 0.9 % IV SOLN
Freq: Once | INTRAVENOUS | Status: AC
  Administered 2013-01-07: 12:00:00 via INTRAVENOUS

## 2013-01-07 MED ORDER — DIPHENHYDRAMINE HCL 25 MG PO CAPS
ORAL_CAPSULE | ORAL | Status: AC
  Filled 2013-01-07: qty 2

## 2013-01-07 MED ORDER — DIPHENHYDRAMINE HCL 25 MG PO CAPS
50.0000 mg | ORAL_CAPSULE | Freq: Once | ORAL | Status: AC
  Administered 2013-01-07: 50 mg via ORAL

## 2013-01-07 NOTE — Patient Instructions (Signed)
Nausea medication   Decadron/Dexamethasone: take two tablets twice a day, the day prior to chemotherapy, then take two tablets twice a day for three days starting the day after chemotherapy.   Ondansetron/Zofran: take twice a day as needed starting the third day after chemotherapy   Prochlorperazine/Compazine: Take every 6 hours as needed for nausea   Lorazepam/Ativan: Take every 6 hours as needed for nausea   Prochlorperazine/Compazine suppository: Use daily as needed for vomiting if you cannot keep your oral medication down.

## 2013-01-07 NOTE — Patient Instructions (Signed)
[facility] Discharge Instructions for Patients Receiving Chemotherapy  Today you received the following chemotherapy agents Herceptin.  To help prevent nausea and vomiting after your treatment, we encourage you to take your nausea medication as prescribed.   If you develop nausea and vomiting that is not controlled by your nausea medication, call the clinic.   BELOW ARE SYMPTOMS THAT SHOULD BE REPORTED IMMEDIATELY:  *FEVER GREATER THAN 100.5 F  *CHILLS WITH OR WITHOUT FEVER  NAUSEA AND VOMITING THAT IS NOT CONTROLLED WITH YOUR NAUSEA MEDICATION  *UNUSUAL SHORTNESS OF BREATH  *UNUSUAL BRUISING OR BLEEDING  TENDERNESS IN MOUTH AND THROAT WITH OR WITHOUT PRESENCE OF ULCERS  *URINARY PROBLEMS  *BOWEL PROBLEMS  UNUSUAL RASH Items with * indicate a potential emergency and should be followed up as soon as possible.  Feel free to call the clinic you have any questions or concerns. The clinic phone number is [phone].    

## 2013-01-07 NOTE — Progress Notes (Signed)
[facility]  Telephone:(336) [phone] Fax:(336) [phone]  OFFICE PROGRESS NOTE  ID: Angela Rosario   DOB: Apr 10, 1940  MR#: [id number]  JYN#:[id number]   PCP: [name], MD SU: Emelia Loron, M.D.   DIAGNOSIS:  Angela Rosario is a 72 y.o. female diagnosed in 08/2012 with stage I, invasive ductal carcinoma of the right breast, estrogen receptor positive, progesterone receptor positive, HER-2/neu positive with Ki-67 of 45%.    PRIOR THERAPY: 1.  Angela Rosario herself palpated a mass approximately 6 months after her annual mammogram.  Diagnostic mammogram on 09/11/2012 showed a 7 mm mass at the 12 o'clock position. Ultrasound showed the mass to be 7 mm at the 12 o'clock position.  Right breast needle core biopsy of the 12 o'clock position on 09/12/2012  showed invasive mammary carcinoma with lobular features, grade 2, estrogen receptor 100% positive, progesterone receptor 100% positive, Ki-67 45%, HER-2/neu by CISH showed amplification with ratio 2.96.  Bilateral breast MRI on 09/18/2012 showed a 1.5 x 1.7 x 1.5 cm irregular mass in the right breast with no mass or abnormal enhancements in the left breast or lymph nodes (clinical stage I, T1 N0).   2.  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  3.  Adjuvant chemotherapy consisting of TCH (Taxotere/Carboplatin/Herceptin) originally scheduled to start on 11/19/2012 however the patient suffered a fall on 11/06/2012 and subsequently underwent arm fracture repair surgery.  She received three weeks of Herceptin therapy that started on 11/19/2012 and adjuvant chemotherapy consisting of TCH started on 12/10/2012.   CURRENT THERAPY:  TCH cycle 2 day 8 (weekly Herceptin)   INTERVAL HISTORY: Angela Rosario is a 72 y.o. female who returns for followup after receiving her second cycle of TCH.  She received two days of hydration with her treatment, and she still got slightly confused and forgot to drink.  She came in and received IV fluids on Saturday, 11/22 and felt much improved.  She experienced diarrhea and stomach aches with the treatment.  She took Imodium for the diarrhea.  She otherwise denies fevers, chills, vomiting, constipation, numbness.  She has been nauseated.  She is confused about her anti-emetics and cannot find the nausea medication instructions I printed out for her.  Otherwise, a 10 point ROS is negative.    MEDICAL HISTORY: Past Medical History  Diagnosis Date  . Syncope   . Diastolic dysfunction   . Hyperlipidemia   . Episode of dizziness     Mild episodes  . Orthostatic hypotension     Some episodes  . Arthritis     oa  . Stress incontinence, female     wears pads  . AC (acromioclavicular) joint bone spurs     bone spurs in neck  . 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 Diabetes mellitus     niddm; last A1C 6.2  . Skin cancer   . Hypertension   . Wears glasses     ALLERGIES:   Allergies  Allergen Reactions  . Oxycodone Other (See Comments)    hallucinations    MEDICATIONS:  Current Outpatient Prescriptions  Medication Sig Dispense Refill  . Alpha-Lipoic Acid 600 MG CAPS Take 1 each by mouth daily.      . Alum & Mag Hydroxide-Simeth (MAGIC MOUTHWASH W/LIDOCAINE) SOLN Take 5 mLs by mouth 4 (four) times daily as needed. Swish and spit, do not swallow  100 mL  4  . carvedilol (COREG) 6.25 MG tablet Take 1 tablet (6.25 mg total) by mouth 2 (two) times daily with a meal.  180 tablet  3  . dexamethasone  (DECADRON) 4 MG tablet Take 2 tablets (8 mg total) by mouth 2 (two) times daily with a meal. Take two times a day the day before Taxotere. Then take two times a day starting the day after chemo for 3 days.  30 tablet  1  . glipiZIDE-metformin (METAGLIP) 2.5-500 MG per tablet Take 1 tablet by mouth every morning.       . lidocaine-prilocaine (EMLA) cream Apply topically as needed.  30 g  1  . LORazepam (ATIVAN) 0.5 MG tablet Take 1 tablet (0.5 mg total) by mouth every 6 (six) hours as needed (Nausea or vomiting).  30 tablet  0  . lovastatin (MEVACOR) 10 MG tablet Take 10 mg by mouth at bedtime.       . Multiple Vitamin (MULTIVITAMIN) capsule Take 1 capsule by mouth daily.      . prochlorperazine (COMPAZINE) 10 MG tablet Take 1 tablet (10 mg total) by mouth every 6 (six) hours as needed (Nausea or vomiting).  30 tablet  1  . tretinoin (RETIN-A) 0.025 % cream Apply topically at bedtime.      Marland Kitchen UNABLE TO FIND Rx: L8015-Post Mastectomy Camisole (Quantity: 2); L8000-Post Surgical Bras (Quantity: 6) Dx: 174.9 Right partial mastectomy  1 each  0  . 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  1 Units  0  . glyBURIDE-metformin (GLUCOVANCE) 2.5-500 MG per tablet Take 1 tablet by mouth daily with breakfast.       . HYDROcodone-acetaminophen (NORCO) 5-325 MG per tablet 1-2 tabs po q6 hours prn pain  40 tablet  0  . ondansetron (ZOFRAN) 8 MG tablet Take 1 tablet (8 mg total) by mouth 2 (two) times daily. Take two times a day starting the day after chemo for 3 days. Then take two times a day as needed for nausea or vomiting.  30 tablet  1   No current facility-administered medications for this visit.   Facility-Administered Medications Ordered in Other Visits  Medication Dose Route Frequency Provider Last Rate Last Dose  . gentamicin (GARAMYCIN) 160 mg in dextrose 5 % 50 mL IVPB  160 mg Intravenous 30 min Pre-Op Marcine Matar, MD        SURGICAL HISTORY:  Past Surgical History   Procedure Laterality Date  . Other surgical history      Hysterectomy  . Cholecystectomy  1992  . Abdominal hysterectomy  1985    1 ovary removed  . Total hip arthroplasty  02/23/2011    Procedure: TOTAL HIP ARTHROPLASTY;  Surgeon: Gus Rankin Aluisio;  Location: [facility];  Service: Orthopedics;  Laterality: Left;  . Pubovaginal sling  02/03/2012    Procedure: Leonides Grills;  Surgeon: Marcine Matar, MD;  Location: [facility];  Service: Urology;  Laterality: N/A;  1 HR  LYNX SLING  . Tonsillectomy    . Breast lumpectomy with needle localization and axillary sentinel lymph node bx Right 10/04/2012    Procedure: RIGHT BREAST LUMPECTOMY WITH NEEDLE LOCALIZATION AND  AXILLARY SENTINEL LYMPH NODE BX;  Surgeon: Emelia Loron, MD;  Location: [facility];  Service: General;  Laterality: Right;  . Portacath placement Left 10/04/2012    Procedure: INSERTION PORT-A-CATH;  Surgeon: Emelia Loron, MD;  Location: [facility];  Service: General;  Laterality: Left;  . Orif wrist fracture Left 11/06/2012    Procedure: LEFT OPEN REDUCTION INTERNAL FIXATION (ORIF) DISTAL RADIUS WRIST FRACTURE;  Surgeon: Tami Ribas, MD;  Location: [facility];  Service: Orthopedics;  Laterality: Left;    REVIEW OF SYSTEMS:  A 10 point review of systems was completed except as noted above.   PHYSICAL EXAMINATION: Blood pressure 144/84, pulse 80, temperature 97.8 F (36.6 C), temperature source Oral, resp. rate 18, height 5\' 6"  (1.676 m), weight 177 lb 12.8 oz (80.65 kg). Body mass index is 28.71 kg/(m^2). General: Patient is a well appearing female in no acute distress HEENT: PERRLA, sclerae anicteric no conjunctival pallor, MMM Neck: supple, no palpable adenopathy Lungs: clear to auscultation bilaterally, no wheezes, rhonchi, or rales Cardiovascular: regular rate rhythm, S1, S2, no murmurs, rubs or gallops Abdomen: Soft, non-tender, non-distended,  normoactive bowel sounds, no HSM Extremities: warm and well perfused, no clubbing, cyanosis, or edema Skin: No rashes or lesions Neuro: Non-focal ECOG PERFORMANCE STATUS: 1 - Symptomatic but completely ambulatory    LABORATORY DATA: Lab Results  Component Value Date   WBC 12.5* 01/07/2013   HGB 12.6 01/07/2013   HCT 37.8 01/07/2013   MCV 90.0 01/07/2013   PLT 156 01/07/2013      Chemistry      Component Value Date/Time   NA 138 01/07/2013 1101   NA 135 11/01/2012 1350   K 3.4* 01/07/2013 1101   K 4.2 11/01/2012 1350   CL 96 11/01/2012 1350   CO2 22 01/07/2013 1101   CO2 26 11/01/2012 1350   BUN 10.5 01/07/2013 1101   BUN 10 11/01/2012 1350   CREATININE 0.8 01/07/2013 1101   CREATININE 0.64 11/01/2012 1350      Component Value Date/Time   CALCIUM 8.4 01/07/2013 1101   CALCIUM 9.7 11/01/2012 1350   ALKPHOS 166* 01/07/2013 1101   ALKPHOS 77 02/17/2011 1045   AST 17 01/07/2013 1101   AST 26 02/17/2011 1045   ALT 54 01/07/2013 1101   ALT 36* 02/17/2011 1045   BILITOT 0.44 01/07/2013 1101   BILITOT 0.5 02/17/2011 1045       RADIOGRAPHIC STUDIES: No results found.   ASSESSMENT:  Angela Rosario is a 72 y.o. female:  1.  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  2.  Adjuvant chemotherapy consisting of TCH (Taxotere/Carboplatin/Herceptin) originally scheduled to start on 11/19/2012 however the patient suffered a fall on 11/06/2012 and subsequently underwent arm fracture repair surgery. She received three weeks of Herceptin therapy  that started on 11/19/2012 and adjuvant chemotherapy consisting of TCH started on 12/10/2012.  3.  Dehydration  PLAN:  1.  Ms. Conchas will proceed with Herceptin today.  Her CBC is stable, I reviewed it with her in detail.    2.  She is nauseated and dehydrated.  She will receive IV fluids and Aloxi today.  I will request repeat IV fluids on 01/09/13.    3.  She will return Wednesday, 11/26 for IV fluids, and next week for her weekly Herceptin.  She is scheduled for genetic counseling/testing on 01/24/2013.  All questions answered.  Ms. Hakeem and her sister Eber Jones were encouraged to contact us in the interim with any questions, concerns, or problems.  I spent 25 minutes counseling the patient face to face.  The total time spent in the appointment was 30 minutes.   Illa Level, NP Medical Oncology [facility] [phone] 01/08/2013  1:36 PM

## 2013-01-07 NOTE — Telephone Encounter (Signed)
Per staff message and POF I have scheduled appts.  JMW  

## 2013-01-09 ENCOUNTER — Ambulatory Visit (HOSPITAL_BASED_OUTPATIENT_CLINIC_OR_DEPARTMENT_OTHER): Payer: Medicare Other

## 2013-01-09 ENCOUNTER — Other Ambulatory Visit: Payer: Self-pay | Admitting: Adult Health

## 2013-01-09 VITALS — BP 129/56 | HR 83 | Temp 97.6°F | Resp 18

## 2013-01-09 DIAGNOSIS — E860 Dehydration: Secondary | ICD-10-CM

## 2013-01-09 DIAGNOSIS — C50119 Malignant neoplasm of central portion of unspecified female breast: Secondary | ICD-10-CM

## 2013-01-09 DIAGNOSIS — C50111 Malignant neoplasm of central portion of right female breast: Secondary | ICD-10-CM

## 2013-01-09 DIAGNOSIS — R197 Diarrhea, unspecified: Secondary | ICD-10-CM

## 2013-01-09 MED ORDER — SODIUM CHLORIDE 0.9 % IJ SOLN
10.0000 mL | Freq: Once | INTRAMUSCULAR | Status: AC
  Administered 2013-01-09: 10 mL via INTRAVENOUS
  Filled 2013-01-09: qty 10

## 2013-01-09 MED ORDER — HEPARIN SOD (PORK) LOCK FLUSH 100 UNIT/ML IV SOLN
500.0000 [IU] | Freq: Once | INTRAVENOUS | Status: AC
  Administered 2013-01-09: 500 [IU] via INTRAVENOUS
  Filled 2013-01-09: qty 5

## 2013-01-09 MED ORDER — SODIUM CHLORIDE 0.9 % IV SOLN
Freq: Once | INTRAVENOUS | Status: AC
  Administered 2013-01-09: 14:00:00 via INTRAVENOUS

## 2013-01-09 NOTE — Patient Instructions (Signed)
[facility] Discharge Instructions for Patients Receiving fluids. Today you received the following IV fluids.   To help prevent nausea and vomiting after your treatment, we encourage you to take your nausea medication  As per Dr. Welton Flakes. (zofran and ativan as ordered) If you develop nausea and vomiting that is not controlled by your nausea medication, call the clinic.   BELOW ARE SYMPTOMS THAT SHOULD BE REPORTED IMMEDIATELY:  *FEVER GREATER THAN 100.5 F  *CHILLS WITH OR WITHOUT FEVER  NAUSEA AND VOMITING THAT IS NOT CONTROLLED WITH YOUR NAUSEA MEDICATION  *UNUSUAL SHORTNESS OF BREATH  *UNUSUAL BRUISING OR BLEEDING  TENDERNESS IN MOUTH AND THROAT WITH OR WITHOUT PRESENCE OF ULCERS  *URINARY PROBLEMS  *BOWEL PROBLEMS  UNUSUAL RASH Items with * indicate a potential emergency and should be followed up as soon as possible.  Feel free to call the clinic you have any questions or concerns. The clinic phone number is [phone].

## 2013-01-09 NOTE — Progress Notes (Signed)
Pt. arrived for IVF today. No order in system. Cherie Ouch NP notified and order received to give one  Liter of fluid.  Pt. stated that she has had >8 episodes of diarrhea on Monday. Taken anti-diarrheal medications with improvement.  Had 2 episodes yesterday and stool is more firm today. Pt. stated that she  had lost of appetite and lost 6 lbs since 11/17. Dietician consult offered to pt.  and order placed. Pt. aware dieitcian will call her to review status. HL

## 2013-01-11 ENCOUNTER — Telehealth: Payer: Self-pay | Admitting: Emergency Medicine

## 2013-01-14 ENCOUNTER — Other Ambulatory Visit: Payer: Medicare Other | Admitting: Lab

## 2013-01-14 ENCOUNTER — Ambulatory Visit: Payer: Medicare Other

## 2013-01-14 ENCOUNTER — Ambulatory Visit: Payer: Medicare Other | Admitting: Family

## 2013-01-15 ENCOUNTER — Encounter: Payer: Self-pay | Admitting: *Deleted

## 2013-01-15 ENCOUNTER — Encounter: Payer: Self-pay | Admitting: Oncology

## 2013-01-15 ENCOUNTER — Other Ambulatory Visit (HOSPITAL_BASED_OUTPATIENT_CLINIC_OR_DEPARTMENT_OTHER): Payer: Medicare Other

## 2013-01-15 ENCOUNTER — Ambulatory Visit (HOSPITAL_BASED_OUTPATIENT_CLINIC_OR_DEPARTMENT_OTHER): Payer: Medicare Other | Admitting: Oncology

## 2013-01-15 ENCOUNTER — Ambulatory Visit (HOSPITAL_BASED_OUTPATIENT_CLINIC_OR_DEPARTMENT_OTHER): Payer: Medicare Other

## 2013-01-15 VITALS — BP 153/78 | HR 90 | Temp 98.2°F | Resp 18 | Ht 66.0 in | Wt 179.6 lb

## 2013-01-15 DIAGNOSIS — E876 Hypokalemia: Secondary | ICD-10-CM

## 2013-01-15 DIAGNOSIS — E860 Dehydration: Secondary | ICD-10-CM

## 2013-01-15 DIAGNOSIS — C50111 Malignant neoplasm of central portion of right female breast: Secondary | ICD-10-CM

## 2013-01-15 DIAGNOSIS — C50119 Malignant neoplasm of central portion of unspecified female breast: Secondary | ICD-10-CM

## 2013-01-15 DIAGNOSIS — Z5112 Encounter for antineoplastic immunotherapy: Secondary | ICD-10-CM

## 2013-01-15 DIAGNOSIS — Z170 Estrogen receptor positive status [ER+]: Secondary | ICD-10-CM

## 2013-01-15 LAB — COMPREHENSIVE METABOLIC PANEL (CC13)
AST: 18 U/L (ref 5–34)
Alkaline Phosphatase: 254 U/L — ABNORMAL HIGH (ref 40–150)
Anion Gap: 14 mEq/L — ABNORMAL HIGH (ref 3–11)
BUN: 8.3 mg/dL (ref 7.0–26.0)
CO2: 23 mEq/L (ref 22–29)
Chloride: 106 mEq/L (ref 98–109)
Creatinine: 0.7 mg/dL (ref 0.6–1.1)
Glucose: 148 mg/dl — ABNORMAL HIGH (ref 70–140)
Total Bilirubin: 0.43 mg/dL (ref 0.20–1.20)

## 2013-01-15 LAB — CBC WITH DIFFERENTIAL/PLATELET
Basophils Absolute: 0 10*3/uL (ref 0.0–0.1)
EOS%: 0.5 % (ref 0.0–7.0)
Eosinophils Absolute: 0.1 10*3/uL (ref 0.0–0.5)
HCT: 33.6 % — ABNORMAL LOW (ref 34.8–46.6)
HGB: 10.9 g/dL — ABNORMAL LOW (ref 11.6–15.9)
LYMPH%: 23.2 % (ref 14.0–49.7)
MCH: 29.7 pg (ref 25.1–34.0)
MCV: 91.6 fL (ref 79.5–101.0)
MONO#: 0.6 10*3/uL (ref 0.1–0.9)
MONO%: 6.3 % (ref 0.0–14.0)
NEUT%: 69.7 % (ref 38.4–76.8)
Platelets: 270 10*3/uL (ref 145–400)
RBC: 3.67 10*6/uL — ABNORMAL LOW (ref 3.70–5.45)
RDW: 15.2 % — ABNORMAL HIGH (ref 11.2–14.5)
nRBC: 0 % (ref 0–0)

## 2013-01-15 MED ORDER — ACETAMINOPHEN 325 MG PO TABS
ORAL_TABLET | ORAL | Status: AC
  Filled 2013-01-15: qty 2

## 2013-01-15 MED ORDER — ACETAMINOPHEN 325 MG PO TABS
650.0000 mg | ORAL_TABLET | Freq: Once | ORAL | Status: AC
  Administered 2013-01-15: 650 mg via ORAL

## 2013-01-15 MED ORDER — HEPARIN SOD (PORK) LOCK FLUSH 100 UNIT/ML IV SOLN
500.0000 [IU] | Freq: Once | INTRAVENOUS | Status: AC | PRN
  Administered 2013-01-15: 500 [IU]
  Filled 2013-01-15: qty 5

## 2013-01-15 MED ORDER — DIPHENHYDRAMINE HCL 25 MG PO CAPS
ORAL_CAPSULE | ORAL | Status: AC
  Filled 2013-01-15: qty 2

## 2013-01-15 MED ORDER — SODIUM CHLORIDE 0.9 % IJ SOLN
10.0000 mL | INTRAMUSCULAR | Status: DC | PRN
  Administered 2013-01-15: 10 mL
  Filled 2013-01-15: qty 10

## 2013-01-15 MED ORDER — SODIUM CHLORIDE 0.9 % IV SOLN
Freq: Once | INTRAVENOUS | Status: AC
  Administered 2013-01-15: 15:00:00 via INTRAVENOUS

## 2013-01-15 MED ORDER — TRASTUZUMAB CHEMO INJECTION 440 MG
2.0000 mg/kg | Freq: Once | INTRAVENOUS | Status: AC
  Administered 2013-01-15: 168 mg via INTRAVENOUS
  Filled 2013-01-15: qty 8

## 2013-01-15 MED ORDER — DIPHENHYDRAMINE HCL 25 MG PO CAPS
50.0000 mg | ORAL_CAPSULE | Freq: Once | ORAL | Status: AC
  Administered 2013-01-15: 50 mg via ORAL

## 2013-01-15 NOTE — Patient Instructions (Signed)
[facility] Discharge Instructions for Patients Receiving Chemotherapy  Today you received the following chemotherapy agents Herceptin.  To help prevent nausea and vomiting after your treatment, we encourage you to take your nausea medication as prescribed.   If you develop nausea and vomiting that is not controlled by your nausea medication, call the clinic.   BELOW ARE SYMPTOMS THAT SHOULD BE REPORTED IMMEDIATELY:  *FEVER GREATER THAN 100.5 F  *CHILLS WITH OR WITHOUT FEVER  NAUSEA AND VOMITING THAT IS NOT CONTROLLED WITH YOUR NAUSEA MEDICATION  *UNUSUAL SHORTNESS OF BREATH  *UNUSUAL BRUISING OR BLEEDING  TENDERNESS IN MOUTH AND THROAT WITH OR WITHOUT PRESENCE OF ULCERS  *URINARY PROBLEMS  *BOWEL PROBLEMS  UNUSUAL RASH Items with * indicate a potential emergency and should be followed up as soon as possible.  Feel free to call the clinic you have any questions or concerns. The clinic phone number is [phone].    

## 2013-01-15 NOTE — Progress Notes (Signed)
[facility]  Telephone:(336) [phone] Fax:(336) [phone]  OFFICE PROGRESS NOTE  ID: Angela Rosario   DOB: [birth year]  MR#: [id number]  ZHY#:[id number]   PCP: [name], MD SU: Emelia Loron, M.D.   DIAGNOSIS:  Angela Rosario is a 72 y.o. female diagnosed in 08/2012 with stage I, invasive ductal carcinoma of the right breast, estrogen receptor positive, progesterone receptor positive, HER-2/neu positive with Ki-67 of 45%.    PRIOR THERAPY: 1.  Angela Rosario palpated a mass approximately 6 months after her annual mammogram.  Diagnostic mammogram on 09/11/2012 showed a 7 mm mass at the 12 o'clock position. Ultrasound showed the mass to be 7 mm at the 12 o'clock position.  Right breast needle core biopsy of the 12 o'clock position on 09/12/2012  showed invasive mammary carcinoma with lobular features, grade 2, estrogen receptor 100% positive, progesterone receptor 100% positive, Ki-67 45%, HER-2/neu by CISH showed amplification with ratio 2.96.  Bilateral breast MRI on 09/18/2012 showed a 1.5 x 1.7 x 1.5 cm irregular mass in the right breast with no mass or abnormal enhancements in the left breast or lymph nodes (clinical stage I, T1 N0).   2.  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  3.  Adjuvant chemotherapy consisting of TCH (Taxotere/Carboplatin/Herceptin) originally scheduled to start on 11/19/2012 however the patient suffered a fall on 11/06/2012 and subsequently underwent arm fracture repair surgery.  She received three weeks of Herceptin therapy that started on 11/19/2012 and adjuvant chemotherapy consisting of TCH started on 12/10/2012.   CURRENT THERAPY:  TCH cycle 2 day 15 (weekly Herceptin)   INTERVAL HISTORY: Angela Rosario is a 72 y.o. female who returns for followup prior to herceptin . Overall patient is doing well. She still continues to have issues with combination chemotherapy. She has diarrhea with Taxotere and carboplatinum combination. Weakness and dehydration. Patient does not do as well at home. We have been trying to give her IV fluids during her treatments. She denies any nausea vomiting today no fevers chills night sweats. Remainder of the 10 point review of systems is negative.  MEDICAL HISTORY: Past Medical History  Diagnosis Date  . Syncope   . Diastolic dysfunction   . Hyperlipidemia   . Episode of dizziness     Mild episodes  . Orthostatic hypotension     Some episodes  . Arthritis     oa  . Stress incontinence, female     wears pads  . AC (acromioclavicular) joint bone spurs     bone spurs in neck  . 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 Diabetes mellitus     niddm; last A1C 6.2  . Skin cancer   . Hypertension   . Wears glasses     ALLERGIES:   Allergies  Allergen Reactions  . Oxycodone Other (See Comments)    hallucinations    MEDICATIONS:  Current Outpatient Prescriptions  Medication Sig Dispense Refill  . Alpha-Lipoic Acid 600 MG CAPS Take 1 each by mouth daily.      . Alum & Mag Hydroxide-Simeth (  MAGIC MOUTHWASH W/LIDOCAINE) SOLN Take 5 mLs by mouth 4 (four) times daily as needed. Swish and spit, do not swallow  100 mL  4  . carvedilol (COREG) 6.25 MG tablet Take 1 tablet (6.25 mg total) by mouth 2 (two) times daily with a meal.  180 tablet  3  . glyBURIDE-metformin (GLUCOVANCE) 2.5-500 MG per tablet Take 1 tablet by mouth daily with breakfast.       . lidocaine-prilocaine (EMLA) cream Apply topically as  needed.  30 g  1  . LORazepam (ATIVAN) 0.5 MG tablet Take 1 tablet (0.5 mg total) by mouth every 6 (six) hours as needed (Nausea or vomiting).  30 tablet  0  . ondansetron (ZOFRAN) 8 MG tablet Take 1 tablet (8 mg total) by mouth 2 (two) times daily. Take two times a day starting the day after chemo for 3 days. Then take two times a day as needed for nausea or vomiting.  30 tablet  1  . prochlorperazine (COMPAZINE) 10 MG tablet Take 1 tablet (10 mg total) by mouth every 6 (six) hours as needed (Nausea or vomiting).  30 tablet  1  . tretinoin (RETIN-A) 0.025 % cream Apply topically at bedtime.      Marland Kitchen UNABLE TO FIND Rx: L8015-Post Mastectomy Camisole (Quantity: 2); L8000-Post Surgical Bras (Quantity: 6) Dx: 174.9 Right partial mastectomy  1 each  0  . 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  1 Units  0  . dexamethasone (DECADRON) 4 MG tablet Take 2 tablets (8 mg total) by mouth 2 (two) times daily with a meal. Take two times a day the day before Taxotere. Then take two times a day starting the day after chemo for 3 days.  30 tablet  1  . glipiZIDE-metformin (METAGLIP) 2.5-500 MG per tablet Take 1 tablet by mouth every morning.       Marland Kitchen HYDROcodone-acetaminophen (NORCO) 5-325 MG per tablet 1-2 tabs po q6 hours prn pain  40 tablet  0  . lovastatin (MEVACOR) 10 MG tablet Take 10 mg by mouth at bedtime.       . Multiple Vitamin (MULTIVITAMIN) capsule Take 1 capsule by mouth daily.       No current facility-administered medications for this visit.   Facility-Administered Medications Ordered in Other Visits  Medication Dose Route Frequency Provider Last Rate Last Dose  . gentamicin (GARAMYCIN) 160 mg in dextrose 5 % 50 mL IVPB  160 mg Intravenous 30 min Pre-Op Marcine Matar, MD        SURGICAL HISTORY:  Past Surgical History  Procedure Laterality Date  . Other surgical history      Hysterectomy  . Cholecystectomy  1992  . Abdominal hysterectomy  1985    1 ovary  removed  . Total hip arthroplasty  02/23/2011    Procedure: TOTAL HIP ARTHROPLASTY;  Surgeon: Gus Rankin Aluisio;  Location: [facility];  Service: Orthopedics;  Laterality: Left;  . Pubovaginal Rosario  02/03/2012    Procedure: Leonides Grills;  Surgeon: Marcine Matar, MD;  Location: [facility];  Service: Urology;  Laterality: N/A;  1 HR  Angela Rosario  . Tonsillectomy    . Breast lumpectomy with needle localization and axillary sentinel lymph node bx Right 10/04/2012    Procedure: RIGHT BREAST LUMPECTOMY WITH NEEDLE LOCALIZATION AND AXILLARY SENTINEL LYMPH NODE BX;  Surgeon: Emelia Loron, MD;  Location: [facility];  Service: General;  Laterality: Right;  . Portacath placement Left 10/04/2012  Procedure: INSERTION PORT-A-CATH;  Surgeon: Emelia Loron, MD;  Location: [facility];  Service: General;  Laterality: Left;  . Orif wrist fracture Left 11/06/2012    Procedure: LEFT OPEN REDUCTION INTERNAL FIXATION (ORIF) DISTAL RADIUS WRIST FRACTURE;  Surgeon: Tami Ribas, MD;  Location: [facility];  Service: Orthopedics;  Laterality: Left;    REVIEW OF SYSTEMS:  A 10 point review of systems was completed except as noted above.   PHYSICAL EXAMINATION: Blood pressure 153/78, pulse 90, temperature 98.2 F (36.8 C), temperature source Oral, resp. rate 18, height 5\' 6"  (1.676 m), weight 179 lb 9 oz (81.449 kg). Body mass index is 29 kg/(m^2). General: Patient is a well appearing female in no acute distress HEENT: PERRLA, sclerae anicteric no conjunctival pallor, MMM Neck: supple, no palpable adenopathy Lungs: clear to auscultation bilaterally, no wheezes, rhonchi, or rales Cardiovascular: regular rate rhythm, S1, S2, no murmurs, rubs or gallops Abdomen: Soft, non-tender, non-distended, normoactive bowel sounds, no HSM Extremities: warm and well perfused, no clubbing, cyanosis, or edema Skin: No rashes or lesions Neuro: Non-focal ECOG  PERFORMANCE STATUS: 1 - Symptomatic but completely ambulatory    LABORATORY DATA: Lab Results  Component Value Date   WBC 9.3 01/15/2013   HGB 10.9* 01/15/2013   HCT 33.6* 01/15/2013   MCV 91.6 01/15/2013   PLT 270 01/15/2013      Chemistry      Component Value Date/Time   NA 138 01/07/2013 1101   NA 135 11/01/2012 1350   K 3.4* 01/07/2013 1101   K 4.2 11/01/2012 1350   CL 96 11/01/2012 1350   CO2 22 01/07/2013 1101   CO2 26 11/01/2012 1350   BUN 10.5 01/07/2013 1101   BUN 10 11/01/2012 1350   CREATININE 0.8 01/07/2013 1101   CREATININE 0.64 11/01/2012 1350      Component Value Date/Time   CALCIUM 8.4 01/07/2013 1101   CALCIUM 9.7 11/01/2012 1350   ALKPHOS 166* 01/07/2013 1101   ALKPHOS 77 02/17/2011 1045   AST 17 01/07/2013 1101   AST 26 02/17/2011 1045   ALT 54 01/07/2013 1101   ALT 36* 02/17/2011 1045   BILITOT 0.44 01/07/2013 1101   BILITOT 0.5 02/17/2011 1045       RADIOGRAPHIC STUDIES: No results found.   ASSESSMENT:  Angela Rosario is a 72 y.o. female:  1.  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  2.  Adjuvant chemotherapy consisting of TCH (Taxotere/Carboplatin/Herceptin) originally scheduled to start on 11/19/2012 however the patient suffered a fall on 11/06/2012 and subsequently underwent arm fracture repair surgery. She received three weeks of Herceptin therapy that started on 11/19/2012 and adjuvant chemotherapy consisting of TCH started on 12/10/2012.  3.  Dehydration: Patient will continue to get IV fluids when she  receives [facility] combination next week  PLAN:  #1 proceed with Herceptin today.  #2 hypokalemia: I have recommended she coconut water which is rich in potassium and he will also help to hydrate her.  #3 rash on the face occurring after Thanksgiving is now resolved. She has been taking hydrocortisone cream and Benadryl.  #4  patient will be seen back in one week's time or cycle 3 of TCH  All questions answered.  Ms. Strohman and her sister Eber Jones were encouraged to contact us in the interim with any questions, concerns, or problems.  I spent 25 minutes counseling the patient face to face.  The total time spent in the appointment was 30 minutes.   Drue Second, MD Medical/Oncology [facility] [phone] (beeper) [phone] (Office)  01/15/2013, 2:14 PM

## 2013-01-15 NOTE — Progress Notes (Signed)
Chaplain made follow-up visit. Pt was present with her son and in good spirits. She stated that she was almost to the halfway point of her treatment and looking forward to the second half or "downhill" of treatment, which she expects to be easier. Pt stated that her friends came over and put up a Christmas tree for her to enjoy. She seems to have excellent support from her network of friends. Pt states that everything is going well. Chaplain will follow up as necessary.

## 2013-01-17 ENCOUNTER — Telehealth: Payer: Self-pay | Admitting: *Deleted

## 2013-01-17 NOTE — Telephone Encounter (Signed)
Per staff message and POF I have scheduled appts.  JMW  

## 2013-01-21 ENCOUNTER — Other Ambulatory Visit (HOSPITAL_BASED_OUTPATIENT_CLINIC_OR_DEPARTMENT_OTHER): Payer: Medicare Other

## 2013-01-21 ENCOUNTER — Telehealth: Payer: Self-pay | Admitting: *Deleted

## 2013-01-21 ENCOUNTER — Ambulatory Visit (HOSPITAL_BASED_OUTPATIENT_CLINIC_OR_DEPARTMENT_OTHER): Payer: Medicare Other | Admitting: Adult Health

## 2013-01-21 ENCOUNTER — Ambulatory Visit (HOSPITAL_BASED_OUTPATIENT_CLINIC_OR_DEPARTMENT_OTHER): Payer: Medicare Other

## 2013-01-21 ENCOUNTER — Encounter: Payer: Self-pay | Admitting: Adult Health

## 2013-01-21 VITALS — BP 127/75 | HR 79 | Temp 98.4°F | Resp 18 | Ht 66.0 in | Wt 181.9 lb

## 2013-01-21 DIAGNOSIS — E860 Dehydration: Secondary | ICD-10-CM

## 2013-01-21 DIAGNOSIS — C50919 Malignant neoplasm of unspecified site of unspecified female breast: Secondary | ICD-10-CM

## 2013-01-21 DIAGNOSIS — C50111 Malignant neoplasm of central portion of right female breast: Secondary | ICD-10-CM

## 2013-01-21 DIAGNOSIS — C773 Secondary and unspecified malignant neoplasm of axilla and upper limb lymph nodes: Secondary | ICD-10-CM

## 2013-01-21 DIAGNOSIS — Z5111 Encounter for antineoplastic chemotherapy: Secondary | ICD-10-CM

## 2013-01-21 DIAGNOSIS — Z5112 Encounter for antineoplastic immunotherapy: Secondary | ICD-10-CM

## 2013-01-21 DIAGNOSIS — C50119 Malignant neoplasm of central portion of unspecified female breast: Secondary | ICD-10-CM

## 2013-01-21 LAB — CBC WITH DIFFERENTIAL/PLATELET
BASO%: 0.1 % (ref 0.0–2.0)
EOS%: 0.1 % (ref 0.0–7.0)
HCT: 33.7 % — ABNORMAL LOW (ref 34.8–46.6)
MCH: 30.5 pg (ref 25.1–34.0)
MCHC: 32.9 g/dL (ref 31.5–36.0)
NEUT%: 80.9 % — ABNORMAL HIGH (ref 38.4–76.8)
Platelets: 494 10*3/uL — ABNORMAL HIGH (ref 145–400)
RDW: 16 % — ABNORMAL HIGH (ref 11.2–14.5)
lymph#: 2.1 10*3/uL (ref 0.9–3.3)

## 2013-01-21 LAB — COMPREHENSIVE METABOLIC PANEL (CC13)
AST: 10 U/L (ref 5–34)
Alkaline Phosphatase: 150 U/L (ref 40–150)
BUN: 14.3 mg/dL (ref 7.0–26.0)
CO2: 24 mEq/L (ref 22–29)
Creatinine: 0.8 mg/dL (ref 0.6–1.1)
Potassium: 4.4 mEq/L (ref 3.5–5.1)
Total Bilirubin: 0.36 mg/dL (ref 0.20–1.20)

## 2013-01-21 MED ORDER — SODIUM CHLORIDE 0.9 % IV SOLN
567.6000 mg | Freq: Once | INTRAVENOUS | Status: AC
  Administered 2013-01-21: 570 mg via INTRAVENOUS
  Filled 2013-01-21: qty 57

## 2013-01-21 MED ORDER — SODIUM CHLORIDE 0.9 % IV SOLN
Freq: Once | INTRAVENOUS | Status: AC
  Administered 2013-01-21: 13:00:00 via INTRAVENOUS

## 2013-01-21 MED ORDER — DEXAMETHASONE SODIUM PHOSPHATE 20 MG/5ML IJ SOLN
INTRAMUSCULAR | Status: AC
  Filled 2013-01-21: qty 5

## 2013-01-21 MED ORDER — DIPHENHYDRAMINE HCL 25 MG PO CAPS
ORAL_CAPSULE | ORAL | Status: AC
  Filled 2013-01-21: qty 2

## 2013-01-21 MED ORDER — SODIUM CHLORIDE 0.9 % IV SOLN
150.0000 mg | Freq: Once | INTRAVENOUS | Status: AC
  Administered 2013-01-21: 150 mg via INTRAVENOUS
  Filled 2013-01-21: qty 5

## 2013-01-21 MED ORDER — TRASTUZUMAB CHEMO INJECTION 440 MG
2.0000 mg/kg | Freq: Once | INTRAVENOUS | Status: AC
  Administered 2013-01-21: 168 mg via INTRAVENOUS
  Filled 2013-01-21: qty 8

## 2013-01-21 MED ORDER — PALONOSETRON HCL INJECTION 0.25 MG/5ML
INTRAVENOUS | Status: AC
  Filled 2013-01-21: qty 5

## 2013-01-21 MED ORDER — ACETAMINOPHEN 325 MG PO TABS
650.0000 mg | ORAL_TABLET | Freq: Once | ORAL | Status: AC
  Administered 2013-01-21: 650 mg via ORAL

## 2013-01-21 MED ORDER — ACETAMINOPHEN 325 MG PO TABS
ORAL_TABLET | ORAL | Status: AC
  Filled 2013-01-21: qty 2

## 2013-01-21 MED ORDER — DEXAMETHASONE SODIUM PHOSPHATE 20 MG/5ML IJ SOLN
12.0000 mg | Freq: Once | INTRAMUSCULAR | Status: AC
  Administered 2013-01-21: 12 mg via INTRAVENOUS

## 2013-01-21 MED ORDER — SODIUM CHLORIDE 0.9 % IV SOLN
75.0000 mg/m2 | Freq: Once | INTRAVENOUS | Status: AC
  Administered 2013-01-21: 150 mg via INTRAVENOUS
  Filled 2013-01-21: qty 15

## 2013-01-21 MED ORDER — DIPHENHYDRAMINE HCL 25 MG PO CAPS
50.0000 mg | ORAL_CAPSULE | Freq: Once | ORAL | Status: AC
  Administered 2013-01-21: 50 mg via ORAL

## 2013-01-21 MED ORDER — HEPARIN SOD (PORK) LOCK FLUSH 100 UNIT/ML IV SOLN
500.0000 [IU] | Freq: Once | INTRAVENOUS | Status: AC | PRN
  Administered 2013-01-21: 500 [IU]
  Filled 2013-01-21: qty 5

## 2013-01-21 MED ORDER — PALONOSETRON HCL INJECTION 0.25 MG/5ML
0.2500 mg | Freq: Once | INTRAVENOUS | Status: AC
  Administered 2013-01-21: 0.25 mg via INTRAVENOUS

## 2013-01-21 MED ORDER — SODIUM CHLORIDE 0.9 % IJ SOLN
10.0000 mL | INTRAMUSCULAR | Status: DC | PRN
  Administered 2013-01-21: 10 mL
  Filled 2013-01-21: qty 10

## 2013-01-21 NOTE — Telephone Encounter (Signed)
Per staff message and POF I have scheduled appts.  JMW  

## 2013-01-21 NOTE — Telephone Encounter (Signed)
appts made and printed. Pt is aware that tx will be added. i emailed MW to add the tx...td 

## 2013-01-21 NOTE — Patient Instructions (Signed)
[facility] Discharge Instructions for Patients Receiving Chemotherapy  Today you received the following chemotherapy agents: Herceptin, Taxotere, Carboplatin   To help prevent nausea and vomiting after your treatment, we encourage you to take your nausea medication as prescribed.    If you develop nausea and vomiting that is not controlled by your nausea medication, call the clinic.   BELOW ARE SYMPTOMS THAT SHOULD BE REPORTED IMMEDIATELY:  *FEVER GREATER THAN 100.5 F  *CHILLS WITH OR WITHOUT FEVER  NAUSEA AND VOMITING THAT IS NOT CONTROLLED WITH YOUR NAUSEA MEDICATION  *UNUSUAL SHORTNESS OF BREATH  *UNUSUAL BRUISING OR BLEEDING  TENDERNESS IN MOUTH AND THROAT WITH OR WITHOUT PRESENCE OF ULCERS  *URINARY PROBLEMS  *BOWEL PROBLEMS  UNUSUAL RASH Items with * indicate a potential emergency and should be followed up as soon as possible.  Feel free to call the clinic you have any questions or concerns. The clinic phone number is [phone].    

## 2013-01-21 NOTE — Progress Notes (Addendum)
[facility]  [phone] [phone]  OFFICE PROGRESS NOTE  ID: [name]   DOB: 04-30-1940  MR#: [id number]  WUX#:[id number]   PCP: [name], MD SU: Emelia Loron, M.D.   DIAGNOSIS:  Angela Rosario is a 72 y.o. female diagnosed in 08/2012 with stage I, invasive ductal carcinoma of the right breast, estrogen receptor positive, progesterone receptor positive, HER-2/neu positive with Ki-67 of 45%.    PRIOR THERAPY: 1.  Angela Rosario herself palpated a mass approximately 6 months after her annual mammogram.  Diagnostic mammogram on 09/11/2012 showed a 7 mm mass at the 12 o'clock position. Ultrasound showed the mass to be 7 mm at the 12 o'clock position.  Right breast needle core biopsy of the 12 o'clock position on 09/12/2012  showed invasive mammary carcinoma with lobular features, grade 2, estrogen receptor 100% positive, progesterone receptor 100% positive, Ki-67 45%, HER-2/neu by CISH showed amplification with ratio 2.96.  Bilateral breast MRI on 09/18/2012 showed a 1.5 x 1.7 x 1.5 cm irregular mass in the right breast with no mass or abnormal enhancements in the left breast or lymph nodes (clinical stage I, T1 N0).   2.  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  3.  Adjuvant chemotherapy consisting of TCH (Taxotere/Carboplatin/Herceptin) originally scheduled to start on 11/19/2012 however the patient suffered a fall on 11/06/2012 and subsequently underwent arm fracture repair surgery.  She received three weeks of Herceptin therapy that started on 11/19/2012 and adjuvant chemotherapy consisting of TCH started on 12/10/2012.   CURRENT THERAPY:  TCH cycle 3 day 1 (weekly Herceptin)   INTERVAL HISTORY: Angela Rosario is a 72 y.o. female who returns for followup prior to [facility].  She is doing well today.  She tried drinking coconut water and wasn't successful.  She is drinking G2 a gatorade drink 4-6 bottles per day in addition to water.  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  This is working well for her.  Her diarrhea has resolved, she hasn't had this in 3-4 days.  Otherwise, she denies fevers, chills, nausea, vomiting, constipation, numbness, or any further concerns.   MEDICAL HISTORY: Past Medical History  Diagnosis Date  . Syncope   . Diastolic dysfunction   . Hyperlipidemia   . Episode of dizziness     Mild episodes  . Orthostatic hypotension     Some episodes  . Arthritis     oa  . Stress incontinence, female     wears pads  . AC (acromioclavicular) joint bone spurs     bone spurs in neck  . 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 Diabetes mellitus     niddm; last A1C 6.2  . Skin cancer   . Hypertension   . Wears glasses     ALLERGIES:   Allergies  Allergen Reactions  . Oxycodone Other (See Comments)    hallucinations    MEDICATIONS:  Current Outpatient Prescriptions  Medication Sig Dispense Refill  . Alpha-Lipoic Acid 600 MG CAPS Take 1  each by mouth daily.      . carvedilol (COREG) 6.25 MG tablet Take 1 tablet (6.25 mg total) by mouth 2 (two) times daily with a meal.  180 tablet  3  . dexamethasone (DECADRON) 4 MG tablet Take 2 tablets (8 mg total) by mouth 2 (two) times daily with a meal. Take two times a day the day before Taxotere. Then take two times a day starting the day after chemo for 3 days.  30 tablet  1  . fluorometholone (FML) 0.1 % ophthalmic  suspension       . glyBURIDE-metformin (GLUCOVANCE) 2.5-500 MG per tablet Take 1 tablet by mouth daily with breakfast.       . HYDROcodone-acetaminophen (NORCO) 5-325 MG per tablet 1-2 tabs po q6 hours prn pain  40 tablet  0  . lidocaine-prilocaine (EMLA) cream Apply topically as needed.  30 g  1  . LORazepam (ATIVAN) 0.5 MG tablet Take 1 tablet (0.5 mg total) by mouth every 6 (six) hours as needed (Nausea or vomiting).  30 tablet  0  . lovastatin (MEVACOR) 10 MG tablet Take 10 mg by mouth at bedtime.       . Multiple Vitamin (MULTIVITAMIN) capsule Take 1 capsule by mouth daily.      . ondansetron (ZOFRAN) 8 MG tablet Take 1 tablet (8 mg total) by mouth 2 (two) times daily. Take two times a day starting the day after chemo for 3 days. Then take two times a day as needed for nausea or vomiting.  30 tablet  1  . tretinoin (RETIN-A) 0.025 % cream Apply topically at bedtime.      Marland Kitchen UNABLE TO FIND Rx: L8015-Post Mastectomy Camisole (Quantity: 2); L8000-Post Surgical Bras (Quantity: 6) Dx: 174.9 Right partial mastectomy  1 each  0  . 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  1 Units  0  . Alum & Mag Hydroxide-Simeth (MAGIC MOUTHWASH W/LIDOCAINE) SOLN Take 5 mLs by mouth 4 (four) times daily as needed. Swish and spit, do not swallow  100 mL  4  . prochlorperazine (COMPAZINE) 10 MG tablet Take 1 tablet (10 mg total) by mouth every 6 (six) hours as needed (Nausea or vomiting).  30 tablet  1   No current facility-administered medications for this visit.   Facility-Administered Medications Ordered in Other Visits  Medication Dose Route Frequency Provider Last Rate Last Dose  . 0.9 %  sodium chloride infusion   Intravenous Once Victorino December, MD      . CARBOplatin (PARAPLATIN) 570 mg in sodium chloride 0.9 % 250 mL chemo infusion  570 mg Intravenous Once Victorino December, MD      . DOCEtaxel (TAXOTERE) 150 mg in sodium chloride 0.9 % 250 mL chemo infusion  75 mg/m2 (Treatment Plan  Actual) Intravenous Once Victorino December, MD 265 mL/hr at 01/21/13 1445 150 mg at 01/21/13 1445  . gentamicin (GARAMYCIN) 160 mg in dextrose 5 % 50 mL IVPB  160 mg Intravenous 30 min Pre-Op Marcine Matar, MD      . heparin lock flush 100 unit/mL  500 Units Intracatheter Once PRN Victorino December, MD      . sodium chloride 0.9 % injection 10 mL  10 mL Intracatheter PRN Victorino December, MD        SURGICAL HISTORY:  Past Surgical History  Procedure Laterality Date  . Other surgical history      Hysterectomy  . Cholecystectomy  1992  . Abdominal  hysterectomy  1985    1 ovary removed  . Total hip arthroplasty  02/23/2011    Procedure: TOTAL HIP ARTHROPLASTY;  Surgeon: Gus Rankin Aluisio;  Location: [facility];  Service: Orthopedics;  Laterality: Left;  . Pubovaginal sling  02/03/2012    Procedure: Leonides Grills;  Surgeon: Marcine Matar, MD;  Location: [facility];  Service: Urology;  Laterality: N/A;  1 HR  LYNX SLING  . Tonsillectomy    . Breast lumpectomy with needle localization and axillary sentinel lymph node bx Right 10/04/2012    Procedure: RIGHT BREAST LUMPECTOMY WITH NEEDLE LOCALIZATION AND AXILLARY SENTINEL LYMPH NODE BX;  Surgeon: Emelia Loron, MD;  Location: [facility];  Service: General;  Laterality: Right;  . Portacath placement Left 10/04/2012    Procedure: INSERTION PORT-A-CATH;  Surgeon: Emelia Loron, MD;  Location: [facility];  Service: General;  Laterality: Left;  . Orif wrist fracture Left 11/06/2012    Procedure: LEFT OPEN REDUCTION INTERNAL FIXATION (ORIF) DISTAL RADIUS WRIST FRACTURE;  Surgeon: Tami Ribas, MD;  Location:  SURGERY CENTER;  Service: Orthopedics;  Laterality: Left;    REVIEW OF SYSTEMS:  A 10 point review of systems was completed except as noted above.   PHYSICAL EXAMINATION: Blood pressure 127/75, pulse 79, temperature 98.4 F (36.9 C), temperature source Oral, resp. rate 18, height 5\' 6"   (1.676 m), weight 181 lb 14.4 oz (82.509 kg). Body mass index is 29.37 kg/(m^2). General: Patient is a well appearing female in no acute distress HEENT: PERRLA, sclerae anicteric no conjunctival pallor, MMM Neck: supple, no palpable adenopathy Lungs: clear to auscultation bilaterally, no wheezes, rhonchi, or rales Cardiovascular: regular rate rhythm, S1, S2, no murmurs, rubs or gallops Abdomen: Soft, non-tender, non-distended, normoactive bowel sounds, no HSM Extremities: warm and well perfused, no clubbing, cyanosis, or edema Skin: No rashes or lesions Neuro: Non-focal ECOG PERFORMANCE STATUS: 1 - Symptomatic but completely ambulatory    LABORATORY DATA: Lab Results  Component Value Date   WBC 17.8* 01/21/2013   HGB 11.1* 01/21/2013   HCT 33.7* 01/21/2013   MCV 92.6 01/21/2013   PLT 494* 01/21/2013      Chemistry      Component Value Date/Time   NA 136 01/21/2013 1105   NA 135 11/01/2012 1350   K 4.4 01/21/2013 1105   K 4.2 11/01/2012 1350   CL 96 11/01/2012 1350   CO2 24 01/21/2013 1105   CO2 26 11/01/2012 1350   BUN 14.3 01/21/2013 1105   BUN 10 11/01/2012 1350   CREATININE 0.8 01/21/2013 1105   CREATININE 0.64 11/01/2012 1350      Component Value Date/Time   CALCIUM 10.2 01/21/2013 1105   CALCIUM 9.7 11/01/2012 1350   ALKPHOS 150 01/21/2013 1105   ALKPHOS 77 02/17/2011 1045   AST 10 01/21/2013 1105   AST 26 02/17/2011 1045   ALT 16 01/21/2013 1105   ALT 36* 02/17/2011 1045   BILITOT 0.36 01/21/2013 1105   BILITOT 0.5 02/17/2011 1045       RADIOGRAPHIC STUDIES: No results found.   ASSESSMENT:  Angela Rosario is a 72 y.o. female:  1.  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  2.  Adjuvant chemotherapy consisting of TCH (Taxotere/Carboplatin/Herceptin) originally scheduled to start on 11/19/2012 however the patient suffered a fall on 11/06/2012 and subsequently underwent arm fracture repair surgery. She received three weeks of Herceptin therapy that started on 11/19/2012 and adjuvant chemotherapy consisting of TCH started on 12/10/2012.  3.  Dehydration: Patient will continue to get IV fluids when she receives [facility] combination therapy  PLAN:  #1 Patient is doing well today.  I reviewed her CBC with her in detail.  She will proceed with [facility] therapy today.  She will receive IV fluids today, tomorrow, and Thursday.    #2 She will continue to drink her gatorade beverages along with water, will f/u on her CMP today.   #3 She will return tomorrow for Neulasta/IV fluids, Thursday for genetic evaluation and IV fluids, and next week for labs, evaluation, and herceptin.   All questions answered.  Ms. Tidmore and her sister Eber Jones were encouraged to contact us in the interim with any questions, concerns, or problems.  I spent 25 minutes counseling the patient face to face.  The total time spent in the appointment was 30 minutes.  Illa Level, NP Medical Oncology [facility] [phone] 01/21/2013, 3:21 PM  ATTENDING'S ATTESTATION:  I personally reviewed patient's chart, examined patient myself, formulated the treatment plan as followed.    Patient will proceed with cycle 3 of TCH today. She will return in one week's time for Herceptin only. Patient is again encouraged to continue taking oral fluids to keep herself hydrated. I have recommended coconut water but she does not like it so she is trying to get water as well as Gatorade beverages as much as she possibly can. She will receive IV fluids today  and tomorrow.  Patient will be seen back tomorrow for Neulasta and in one week for lab as well as next Herceptin.  Drue Second, MD Medical/Oncology [facility] [phone] (beeper) [phone] (Office)  01/21/2013, 5:11 PM

## 2013-01-22 ENCOUNTER — Ambulatory Visit (HOSPITAL_BASED_OUTPATIENT_CLINIC_OR_DEPARTMENT_OTHER): Payer: Medicare Other

## 2013-01-22 ENCOUNTER — Other Ambulatory Visit: Payer: Self-pay | Admitting: *Deleted

## 2013-01-22 ENCOUNTER — Ambulatory Visit: Payer: Medicare Other

## 2013-01-22 VITALS — BP 124/72 | HR 65 | Temp 97.8°F | Resp 20

## 2013-01-22 DIAGNOSIS — C50119 Malignant neoplasm of central portion of unspecified female breast: Secondary | ICD-10-CM

## 2013-01-22 DIAGNOSIS — C50111 Malignant neoplasm of central portion of right female breast: Secondary | ICD-10-CM

## 2013-01-22 DIAGNOSIS — E860 Dehydration: Secondary | ICD-10-CM

## 2013-01-22 DIAGNOSIS — Z5189 Encounter for other specified aftercare: Secondary | ICD-10-CM

## 2013-01-22 MED ORDER — SODIUM CHLORIDE 0.9 % IV SOLN
Freq: Once | INTRAVENOUS | Status: AC
  Administered 2013-01-22: 10:00:00 via INTRAVENOUS

## 2013-01-22 MED ORDER — HEPARIN SOD (PORK) LOCK FLUSH 100 UNIT/ML IV SOLN
500.0000 [IU] | Freq: Once | INTRAVENOUS | Status: AC
  Administered 2013-01-22: 500 [IU] via INTRAVENOUS
  Filled 2013-01-22: qty 5

## 2013-01-22 MED ORDER — SODIUM CHLORIDE 0.9 % IJ SOLN
10.0000 mL | INTRAMUSCULAR | Status: DC | PRN
  Administered 2013-01-22: 10 mL via INTRAVENOUS
  Filled 2013-01-22: qty 10

## 2013-01-22 MED ORDER — PEGFILGRASTIM INJECTION 6 MG/0.6ML
6.0000 mg | Freq: Once | SUBCUTANEOUS | Status: AC
  Administered 2013-01-22: 6 mg via SUBCUTANEOUS
  Filled 2013-01-22: qty 0.6

## 2013-01-22 NOTE — Patient Instructions (Signed)
Dehydration, Adult Dehydration is when you lose more fluids from the body than you take in. Vital organs like the kidneys, brain, and heart cannot function without a proper amount of fluids and salt. Any loss of fluids from the body can cause dehydration.  CAUSES   Vomiting.  Diarrhea.  Excessive sweating.  Excessive urine output.  Fever. SYMPTOMS  Mild dehydration  Thirst.  Dry lips.  Slightly dry mouth. Moderate dehydration  Very dry mouth.  Sunken eyes.  Skin does not bounce back quickly when lightly pinched and released.  Dark urine and decreased urine production.  Decreased tear production.  Headache. Severe dehydration  Very dry mouth.  Extreme thirst.  Rapid, weak pulse (more than 100 beats per minute at rest).  Cold hands and feet.  Not able to sweat in spite of heat and temperature.  Rapid breathing.  Blue lips.  Confusion and lethargy.  Difficulty being awakened.  Minimal urine production.  No tears. DIAGNOSIS  Your caregiver will diagnose dehydration based on your symptoms and your exam. Blood and urine tests will help confirm the diagnosis. The diagnostic evaluation should also identify the cause of dehydration. TREATMENT  Treatment of mild or moderate dehydration can often be done at home by increasing the amount of fluids that you drink. It is best to drink small amounts of fluid more often. Drinking too much at one time can make vomiting worse. Refer to the home care instructions below. Severe dehydration needs to be treated at the hospital where you will probably be given intravenous (IV) fluids that contain water and electrolytes. HOME CARE INSTRUCTIONS   Ask your caregiver about specific rehydration instructions.  Drink enough fluids to keep your urine clear or pale yellow.  Drink small amounts frequently if you have nausea and vomiting.  Eat as you normally do.  Avoid:  Foods or drinks high in sugar.  Carbonated  drinks.  Juice.  Extremely hot or cold fluids.  Drinks with caffeine.  Fatty, greasy foods.  Alcohol.  Tobacco.  Overeating.  Gelatin desserts.  Wash your hands well to avoid spreading bacteria and viruses.  Only take over-the-counter or prescription medicines for pain, discomfort, or fever as directed by your caregiver.  Ask your caregiver if you should continue all prescribed and over-the-counter medicines.  Keep all follow-up appointments with your caregiver. SEEK MEDICAL CARE IF:  You have abdominal pain and it increases or stays in one area (localizes).  You have a rash, stiff neck, or severe headache.  You are irritable, sleepy, or difficult to awaken.  You are weak, dizzy, or extremely thirsty. SEEK IMMEDIATE MEDICAL CARE IF:   You are unable to keep fluids down or you get worse despite treatment.  You have frequent episodes of vomiting or diarrhea.  You have blood or green matter (bile) in your vomit.  You have blood in your stool or your stool looks black and tarry.  You have not urinated in 6 to 8 hours, or you have only urinated a small amount of very dark urine.  You have a fever.  You faint. MAKE SURE YOU:   Understand these instructions.  Will watch your condition.  Will get help right away if you are not doing well or get worse. Document Released: 01/31/2005 Document Revised: 04/25/2011 Document Reviewed: 09/20/2010 Exit[employer] Patient Information 2014 [employer].  

## 2013-01-23 ENCOUNTER — Ambulatory Visit: Payer: Medicare Other

## 2013-01-24 ENCOUNTER — Ambulatory Visit (HOSPITAL_BASED_OUTPATIENT_CLINIC_OR_DEPARTMENT_OTHER): Payer: Medicare Other

## 2013-01-24 ENCOUNTER — Other Ambulatory Visit: Payer: Self-pay | Admitting: *Deleted

## 2013-01-24 ENCOUNTER — Other Ambulatory Visit: Payer: Medicare Other

## 2013-01-24 ENCOUNTER — Encounter: Payer: Self-pay | Admitting: Genetic Counselor

## 2013-01-24 ENCOUNTER — Ambulatory Visit (HOSPITAL_BASED_OUTPATIENT_CLINIC_OR_DEPARTMENT_OTHER): Payer: Medicare Other | Admitting: Genetic Counselor

## 2013-01-24 DIAGNOSIS — E860 Dehydration: Secondary | ICD-10-CM

## 2013-01-24 DIAGNOSIS — Z8042 Family history of malignant neoplasm of prostate: Secondary | ICD-10-CM

## 2013-01-24 DIAGNOSIS — Z803 Family history of malignant neoplasm of breast: Secondary | ICD-10-CM

## 2013-01-24 DIAGNOSIS — IMO0002 Reserved for concepts with insufficient information to code with codable children: Secondary | ICD-10-CM

## 2013-01-24 DIAGNOSIS — C50119 Malignant neoplasm of central portion of unspecified female breast: Secondary | ICD-10-CM

## 2013-01-24 MED ORDER — SODIUM CHLORIDE 0.9 % IV SOLN
Freq: Once | INTRAVENOUS | Status: DC
  Administered 2013-01-24: 11:00:00 via INTRAVENOUS

## 2013-01-24 NOTE — Progress Notes (Addendum)
Dr.  Drue Second requested a consultation for genetic counseling and risk assessment for Angela Rosario, a 72 y.o. female, for discussion of her personal history of breast cancer and skin cancers and family history of breast cancer, including female breast cancer, and prostate cancer.  She presents to clinic today to discuss the possibility of a genetic predisposition to cancer, and to further clarify her risks, as well as her family members' risks for cancer.   HISTORY OF PRESENT ILLNESS: In 2014, at the age of 78, Angela Rosario was diagnosed with invasive ductal carcinoma of the breast. This was treated with lumpectomy, chemotherapy, radiation and tamoxifen for 5 years.  The tumor was triple positive.      Past Medical History  Diagnosis Date  . Syncope   . Diastolic dysfunction   . Hyperlipidemia   . Episode of dizziness     Mild episodes  . Orthostatic hypotension     Some episodes  . Arthritis     oa  . Stress incontinence, female     wears pads  . AC (acromioclavicular) joint bone spurs     bone spurs in neck  . 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 Diabetes mellitus     niddm; last A1C 6.2  . Skin cancer   . Hypertension   . Wears glasses     Past Surgical History  Procedure Laterality Date  . Other surgical history      Hysterectomy  . Cholecystectomy  1992  . Abdominal hysterectomy  1985    1 ovary removed  . Total hip arthroplasty  02/23/2011    Procedure: TOTAL HIP ARTHROPLASTY;  Surgeon: Gus Rankin Aluisio;  Location: [facility];  Service: Orthopedics;  Laterality: Left;  . Pubovaginal sling  02/03/2012    Procedure: Leonides Grills;  Surgeon: Marcine Matar, MD;  Location: [facility];  Service: Urology;  Laterality: N/A;  1 HR  LYNX SLING  . Tonsillectomy    . Breast lumpectomy with needle localization and axillary sentinel lymph node bx Right 10/04/2012    Procedure: RIGHT BREAST LUMPECTOMY WITH NEEDLE LOCALIZATION  AND AXILLARY SENTINEL LYMPH NODE BX;  Surgeon: Emelia Loron, MD;  Location: [facility];  Service: General;  Laterality: Right;  . Portacath placement Left 10/04/2012    Procedure: INSERTION PORT-A-CATH;  Surgeon: Emelia Loron, MD;  Location: [facility];  Service: General;  Laterality: Left;  . Orif wrist fracture Left 11/06/2012    Procedure: LEFT OPEN REDUCTION INTERNAL FIXATION (ORIF) DISTAL RADIUS WRIST FRACTURE;  Surgeon: Tami Ribas, MD;  Location: [facility];  Service: Orthopedics;  Laterality: Left;    History   Social History  . Marital Status: Single    Spouse Name: N/A    Number of Children: N/A  . Years of Education: N/A   Social History Main Topics  . Smoking status: Former Smoker -- 0.25 packs/day for 1 years    Quit date: 02/14/1957  . Smokeless tobacco: Never Used  . Alcohol Use: No  . Drug Use: No  . Sexual Activity: Not Currently    Birth Control/ Protection: Surgical   Other Topics Concern  . None   Social History Narrative  . None    REPRODUCTIVE HISTORY AND PERSONAL RISK ASSESSMENT FACTORS: Menarche was at age 60.   postmenopausal Uterus Intact: yes, but only one Ovaries Intact: no G2P2A0, first live birth at age 40  She  has not previously undergone treatment for infertility.   Oral Contraceptive use: 30 years   She has not used HRT in the past.    FAMILY HISTORY:  We obtained a detailed, 4-generation family history.  Significant diagnoses are listed below: Family History  Problem Relation Age of Onset  . Lung cancer Maternal Uncle   . Breast cancer Paternal Aunt     dx <50  . Breast cancer Paternal Uncle 43  . Prostate cancer Cousin     paternal cousin   Patient's ancestors are of [place] descent. There is no reported Ashkenazi Jewish ancestry. There is possible known consanguinity.  GENETIC COUNSELING ASSESSMENT: Angela Rosario is a 72 y.o. female with a personal history of  breast cancer and family history of breast cancer, including female breast cancer, which somewhat suggestive of a hereditary cancer syndrome and predisposition to cancer. We, therefore, discussed and recommended the following at today's visit.   DISCUSSION: We reviewed the characteristics, features and inheritance patterns of hereditary cancer syndromes. We also discussed genetic testing, including the appropriate family members to test, the process of testing, insurance coverage and turn-around-time for results. We discussed that when there is a family history of female breast cancer there is a higher liklihood that his breast cancer is associated with a hereditary cancer syndrome.  AT this time, Angela Rosario, Angela Rosario mutations have an increased risk for female breast cancer.  It is thought that about 2/3 of female breast cancer is associated with hereditary cancer syndromes.  USing that risk, the patient's chance of testing positive for a hereditary cancer syndrome would be approximately 1/6.  In order to estimate her chance of having a BRCA mutation, we used statistical models (Penn II) and laboratory data that take into account her personal medical history, family history and ancestry.  Because each model is different, there can be a lot of variability in the risks they give.  Therefore, these numbers must be considered a rough range and not a precise risk of having a BRCA mutation.  These models estimate that she has approximately a 23% chance of having a mutation. Based on this assessment of her family and personal history, genetic testing is recommended.  PLAN: After considering the risks, benefits, and limitations, Angela Rosario declined testing today, but thought she may want to pursue testing at a later date.  She will consider having her blood drawn when she comes in for chemotherapy, and have it drawn from her port.  We discussed the implications of a positive, negative and/ or variant of  uncertain significance genetic test result. Results should be available within approximately 3 weeks' time, at which point they will be disclosed by telephone to Angela Rosario, as will any additional recommendations warranted by these results. Angela Rosario will receive a summary of her genetic counseling visit and a copy of her results once available. This information will also be available in Epic. We encouraged Angela Rosario to remain in contact with cancer genetics annually so that we can continuously update the family history and inform her of any changes in cancer genetics and testing that may be of benefit for her family. Virlee Stroschein Capili's questions were answered to her satisfaction today. Our contact information was provided should additional questions or concerns arise.  The patient was seen for a total of 60 minutes, greater than 50% of which was spent face-to-face counseling.  This note will also be sent to the referring provider  via the electronic medical record. The patient will be supplied with a summary of this genetic counseling discussion as well as educational information on the discussed hereditary cancer syndromes following the conclusion of their visit.   Patient was discussed with Dr. Drue Second.   _______________________________________________________________________ For Office Staff:  Number of people involved in session: 2 Was an Intern/ student involved with case: yes

## 2013-01-24 NOTE — Patient Instructions (Signed)
Dehydration, Adult Dehydration is when you lose more fluids from the body than you take in. Vital organs like the kidneys, brain, and heart cannot function without a proper amount of fluids and salt. Any loss of fluids from the body can cause dehydration.  CAUSES   Vomiting.  Diarrhea.  Excessive sweating.  Excessive urine output.  Fever. SYMPTOMS  Mild dehydration  Thirst.  Dry lips.  Slightly dry mouth. Moderate dehydration  Very dry mouth.  Sunken eyes.  Skin does not bounce back quickly when lightly pinched and released.  Dark urine and decreased urine production.  Decreased tear production.  Headache. Severe dehydration  Very dry mouth.  Extreme thirst.  Rapid, weak pulse (more than 100 beats per minute at rest).  Cold hands and feet.  Not able to sweat in spite of heat and temperature.  Rapid breathing.  Blue lips.  Confusion and lethargy.  Difficulty being awakened.  Minimal urine production.  No tears. DIAGNOSIS  Your caregiver will diagnose dehydration based on your symptoms and your exam. Blood and urine tests will help confirm the diagnosis. The diagnostic evaluation should also identify the cause of dehydration. TREATMENT  Treatment of mild or moderate dehydration can often be done at home by increasing the amount of fluids that you drink. It is best to drink small amounts of fluid more often. Drinking too much at one time can make vomiting worse. Refer to the home care instructions below. Severe dehydration needs to be treated at the hospital where you will probably be given intravenous (IV) fluids that contain water and electrolytes. HOME CARE INSTRUCTIONS   Ask your caregiver about specific rehydration instructions.  Drink enough fluids to keep your urine clear or pale yellow.  Drink small amounts frequently if you have nausea and vomiting.  Eat as you normally do.  Avoid:  Foods or drinks high in sugar.  Carbonated  drinks.  Juice.  Extremely hot or cold fluids.  Drinks with caffeine.  Fatty, greasy foods.  Alcohol.  Tobacco.  Overeating.  Gelatin desserts.  Wash your hands well to avoid spreading bacteria and viruses.  Only take over-the-counter or prescription medicines for pain, discomfort, or fever as directed by your caregiver.  Ask your caregiver if you should continue all prescribed and over-the-counter medicines.  Keep all follow-up appointments with your caregiver. SEEK MEDICAL CARE IF:  You have abdominal pain and it increases or stays in one area (localizes).  You have a rash, stiff neck, or severe headache.  You are irritable, sleepy, or difficult to awaken.  You are weak, dizzy, or extremely thirsty. SEEK IMMEDIATE MEDICAL CARE IF:   You are unable to keep fluids down or you get worse despite treatment.  You have frequent episodes of vomiting or diarrhea.  You have blood or green matter (bile) in your vomit.  You have blood in your stool or your stool looks black and tarry.  You have not urinated in 6 to 8 hours, or you have only urinated a small amount of very dark urine.  You have a fever.  You faint. MAKE SURE YOU:   Understand these instructions.  Will watch your condition.  Will get help right away if you are not doing well or get worse. Document Released: 01/31/2005 Document Revised: 04/25/2011 Document Reviewed: 09/20/2010 Exit[employer] Patient Information 2014 [employer].  

## 2013-01-25 ENCOUNTER — Encounter: Payer: Self-pay | Admitting: Oncology

## 2013-01-25 NOTE — Progress Notes (Signed)
Patient's sister had come by checking on asst with [facility]. They have a bill showing balance as her responsibility. I called [employer] and they will refer for asst. I will let her know to be looking for call from them.

## 2013-01-25 NOTE — Progress Notes (Signed)
Called [facility] and spoke with Mindi Junker and they are going to review for copay asst. I called to let the patient know also and she said to call sister Eber Jones.(we have ok to speak to her)--- [phone]. I called to let Ms. Eber Jones know and she said to see if we can get [employer] call her instead of patient.--she handles all for her.

## 2013-01-28 ENCOUNTER — Encounter: Payer: Self-pay | Admitting: Adult Health

## 2013-01-28 ENCOUNTER — Ambulatory Visit (HOSPITAL_BASED_OUTPATIENT_CLINIC_OR_DEPARTMENT_OTHER): Payer: Medicare Other

## 2013-01-28 ENCOUNTER — Telehealth: Payer: Self-pay | Admitting: Adult Health

## 2013-01-28 ENCOUNTER — Other Ambulatory Visit (HOSPITAL_BASED_OUTPATIENT_CLINIC_OR_DEPARTMENT_OTHER): Payer: Medicare Other

## 2013-01-28 ENCOUNTER — Ambulatory Visit: Payer: Medicare Other | Admitting: Family

## 2013-01-28 ENCOUNTER — Other Ambulatory Visit: Payer: Medicare Other | Admitting: Lab

## 2013-01-28 ENCOUNTER — Ambulatory Visit (HOSPITAL_BASED_OUTPATIENT_CLINIC_OR_DEPARTMENT_OTHER): Payer: Medicare Other | Admitting: Adult Health

## 2013-01-28 ENCOUNTER — Ambulatory Visit (HOSPITAL_COMMUNITY)
Admission: RE | Admit: 2013-01-28 | Discharge: 2013-01-28 | Disposition: A | Discharge status: Home / Self Care | Payer: Medicare Other | Source: Ambulatory Visit | Attending: Adult Health | Admitting: Adult Health

## 2013-01-28 VITALS — BP 129/84 | HR 103 | Temp 100.9°F | Resp 20 | Ht 66.0 in | Wt 174.8 lb

## 2013-01-28 DIAGNOSIS — C773 Secondary and unspecified malignant neoplasm of axilla and upper limb lymph nodes: Secondary | ICD-10-CM

## 2013-01-28 DIAGNOSIS — R0602 Shortness of breath: Secondary | ICD-10-CM | POA: Insufficient documentation

## 2013-01-28 DIAGNOSIS — E860 Dehydration: Secondary | ICD-10-CM

## 2013-01-28 DIAGNOSIS — R059 Cough, unspecified: Secondary | ICD-10-CM

## 2013-01-28 DIAGNOSIS — R509 Fever, unspecified: Secondary | ICD-10-CM

## 2013-01-28 DIAGNOSIS — C50119 Malignant neoplasm of central portion of unspecified female breast: Secondary | ICD-10-CM

## 2013-01-28 DIAGNOSIS — R05 Cough: Secondary | ICD-10-CM

## 2013-01-28 DIAGNOSIS — R0989 Other specified symptoms and signs involving the circulatory and respiratory systems: Secondary | ICD-10-CM | POA: Insufficient documentation

## 2013-01-28 DIAGNOSIS — I10 Essential (primary) hypertension: Secondary | ICD-10-CM | POA: Insufficient documentation

## 2013-01-28 DIAGNOSIS — C50111 Malignant neoplasm of central portion of right female breast: Secondary | ICD-10-CM

## 2013-01-28 DIAGNOSIS — Z79899 Other long term (current) drug therapy: Secondary | ICD-10-CM | POA: Insufficient documentation

## 2013-01-28 DIAGNOSIS — E119 Type 2 diabetes mellitus without complications: Secondary | ICD-10-CM | POA: Insufficient documentation

## 2013-01-28 DIAGNOSIS — C50919 Malignant neoplasm of unspecified site of unspecified female breast: Secondary | ICD-10-CM

## 2013-01-28 DIAGNOSIS — M5380 Other specified dorsopathies, site unspecified: Secondary | ICD-10-CM | POA: Insufficient documentation

## 2013-01-28 DIAGNOSIS — Z5112 Encounter for antineoplastic immunotherapy: Secondary | ICD-10-CM

## 2013-01-28 DIAGNOSIS — Z5111 Encounter for antineoplastic chemotherapy: Secondary | ICD-10-CM

## 2013-01-28 DIAGNOSIS — R110 Nausea: Secondary | ICD-10-CM

## 2013-01-28 LAB — URINALYSIS, MICROSCOPIC - CHCC
Protein: 100 mg/dL
Specific Gravity, Urine: 1.03 (ref 1.003–1.035)
Urobilinogen, UR: 0.2 mg/dL (ref 0.2–1)
pH: 6 (ref 4.6–8.0)

## 2013-01-28 LAB — CBC WITH DIFFERENTIAL/PLATELET
Basophils Absolute: 0 10*3/uL (ref 0.0–0.1)
EOS%: 0.4 % (ref 0.0–7.0)
HCT: 36.4 % (ref 34.8–46.6)
HGB: 12.3 g/dL (ref 11.6–15.9)
MCH: 30.8 pg (ref 25.1–34.0)
MCV: 91 fL (ref 79.5–101.0)
MONO#: 1.6 10*3/uL — ABNORMAL HIGH (ref 0.1–0.9)
MONO%: 16.3 % — ABNORMAL HIGH (ref 0.0–14.0)
NEUT%: 67.8 % (ref 38.4–76.8)
lymph#: 1.5 10*3/uL (ref 0.9–3.3)

## 2013-01-28 LAB — COMPREHENSIVE METABOLIC PANEL (CC13)
AST: 37 U/L — ABNORMAL HIGH (ref 5–34)
Albumin: 3.7 g/dL (ref 3.5–5.0)
Alkaline Phosphatase: 327 U/L — ABNORMAL HIGH (ref 40–150)
Anion Gap: 13 mEq/L — ABNORMAL HIGH (ref 3–11)
BUN: 10.2 mg/dL (ref 7.0–26.0)
CO2: 23 mEq/L (ref 22–29)
Calcium: 9.9 mg/dL (ref 8.4–10.4)
Glucose: 226 mg/dl — ABNORMAL HIGH (ref 70–140)
Potassium: 3.9 mEq/L (ref 3.5–5.1)
Total Bilirubin: 0.99 mg/dL (ref 0.20–1.20)

## 2013-01-28 MED ORDER — SODIUM CHLORIDE 0.9 % IV SOLN
Freq: Once | INTRAVENOUS | Status: AC
  Administered 2013-01-28: 16:00:00 via INTRAVENOUS

## 2013-01-28 MED ORDER — AZITHROMYCIN 250 MG PO TABS: ORAL_TABLET | ORAL | Status: DC

## 2013-01-28 MED ORDER — DIPHENHYDRAMINE HCL 25 MG PO CAPS
50.0000 mg | ORAL_CAPSULE | Freq: Once | ORAL | Status: AC
  Administered 2013-01-28: 50 mg via ORAL

## 2013-01-28 MED ORDER — TRASTUZUMAB CHEMO INJECTION 440 MG
2.0000 mg/kg | Freq: Once | INTRAVENOUS | Status: AC
  Administered 2013-01-28: 168 mg via INTRAVENOUS
  Filled 2013-01-28: qty 8

## 2013-01-28 MED ORDER — ONDANSETRON 8 MG/50ML IVPB (CHCC)
8.0000 mg | Freq: Once | INTRAVENOUS | Status: AC
  Administered 2013-01-28: 8 mg via INTRAVENOUS

## 2013-01-28 MED ORDER — ACETAMINOPHEN 325 MG PO TABS
ORAL_TABLET | ORAL | Status: AC
  Filled 2013-01-28: qty 2

## 2013-01-28 MED ORDER — SODIUM CHLORIDE 0.9 % IJ SOLN
10.0000 mL | INTRAMUSCULAR | Status: DC | PRN
  Administered 2013-01-28: 10 mL
  Filled 2013-01-28: qty 10

## 2013-01-28 MED ORDER — ACETAMINOPHEN 325 MG PO TABS
650.0000 mg | ORAL_TABLET | Freq: Once | ORAL | Status: AC
  Administered 2013-01-28: 650 mg via ORAL

## 2013-01-28 MED ORDER — ONDANSETRON 8 MG/NS 50 ML IVPB
INTRAVENOUS | Status: AC
  Filled 2013-01-28: qty 8

## 2013-01-28 MED ORDER — DIPHENHYDRAMINE HCL 25 MG PO CAPS
ORAL_CAPSULE | ORAL | Status: AC
  Filled 2013-01-28: qty 2

## 2013-01-28 MED ORDER — HEPARIN SOD (PORK) LOCK FLUSH 100 UNIT/ML IV SOLN
500.0000 [IU] | Freq: Once | INTRAVENOUS | Status: AC | PRN
  Administered 2013-01-28: 500 [IU]
  Filled 2013-01-28: qty 5

## 2013-01-28 NOTE — Patient Instructions (Signed)
[facility] Discharge Instructions for Patients Receiving Chemotherapy  Today you received the following chemotherapy agents Herceptin.  To help prevent nausea and vomiting after your treatment, we encourage you to take your nausea medication Zofran 8 mg every 12 hours as needed.   If you develop nausea and vomiting that is not controlled by your nausea medication, call the clinic.   BELOW ARE SYMPTOMS THAT SHOULD BE REPORTED IMMEDIATELY:  *FEVER GREATER THAN 100.5 F  *CHILLS WITH OR WITHOUT FEVER  NAUSEA AND VOMITING THAT IS NOT CONTROLLED WITH YOUR NAUSEA MEDICATION  *UNUSUAL SHORTNESS OF BREATH  *UNUSUAL BRUISING OR BLEEDING  TENDERNESS IN MOUTH AND THROAT WITH OR WITHOUT PRESENCE OF ULCERS  *URINARY PROBLEMS  *BOWEL PROBLEMS  UNUSUAL RASH Items with * indicate a potential emergency and should be followed up as soon as possible.  Feel free to call the clinic you have any questions or concerns. The clinic phone number is [phone].   Dehydration, Adult Dehydration is when you lose more fluids from the body than you take in. Vital organs like the kidneys, brain, and heart cannot function without a proper amount of fluids and salt. Any loss of fluids from the body can cause dehydration.  CAUSES   Vomiting.  Diarrhea.  Excessive sweating.  Excessive urine output.  Fever. SYMPTOMS  Mild dehydration  Thirst.  Dry lips.  Slightly dry mouth. Moderate dehydration  Very dry mouth.  Sunken eyes.  Skin does not bounce back quickly when lightly pinched and released.  Dark urine and decreased urine production.  Decreased tear production.  Headache. Severe dehydration  Very dry mouth.  Extreme thirst.  Rapid, weak pulse (more than 100 beats per minute at rest).  Cold hands and feet.  Not able to sweat in spite of heat and temperature.  Rapid breathing.  Blue lips.  Confusion and lethargy.  Difficulty being  awakened.  Minimal urine production.  No tears. DIAGNOSIS  Your caregiver will diagnose dehydration based on your symptoms and your exam. Blood and urine tests will help confirm the diagnosis. The diagnostic evaluation should also identify the cause of dehydration. TREATMENT  Treatment of mild or moderate dehydration can often be done at home by increasing the amount of fluids that you drink. It is best to drink small amounts of fluid more often. Drinking too much at one time can make vomiting worse. Refer to the home care instructions below. Severe dehydration needs to be treated at the hospital where you will probably be given intravenous (IV) fluids that contain water and electrolytes. HOME CARE INSTRUCTIONS   Ask your caregiver about specific rehydration instructions.  Drink enough fluids to keep your urine clear or pale yellow.  Drink small amounts frequently if you have nausea and vomiting.  Eat as you normally do.  Avoid:  Foods or drinks high in sugar.  Carbonated drinks.  Juice.  Extremely hot or cold fluids.  Drinks with caffeine.  Fatty, greasy foods.  Alcohol.  Tobacco.  Overeating.  Gelatin desserts.  Wash your hands well to avoid spreading bacteria and viruses.  Only take over-the-counter or prescription medicines for pain, discomfort, or fever as directed by your caregiver.  Ask your caregiver if you should continue all prescribed and over-the-counter medicines.  Keep all follow-up appointments with your caregiver. SEEK MEDICAL CARE IF:  You have abdominal pain and it increases or stays in one area (localizes).  You have a rash, stiff neck, or severe headache.  You are irritable, sleepy,  or difficult to awaken.  You are weak, dizzy, or extremely thirsty. SEEK IMMEDIATE MEDICAL CARE IF:   You are unable to keep fluids down or you get worse despite treatment.  You have frequent episodes of vomiting or diarrhea.  You have blood or green  matter (bile) in your vomit.  You have blood in your stool or your stool looks black and tarry.  You have not urinated in 6 to 8 hours, or you have only urinated a small amount of very dark urine.  You have a fever.  You faint. MAKE SURE YOU:   Understand these instructions.  Will watch your condition.  Will get help right away if you are not doing well or get worse. Document Released: 01/31/2005 Document Revised: 04/25/2011 Document Reviewed: 09/20/2010 [facility] Patient Information 2014 [place], [place].

## 2013-01-28 NOTE — Progress Notes (Signed)
[facility]  Telephone:([phone] [phone]  OFFICE PROGRESS NOTE  ID: Angela Rosario   DOB: [birth year]  MR#: [id number]  MWU#:[id number]   PCP: [name], MD SU: Emelia Loron, M.D.   DIAGNOSIS:  Angela Rosario is a 72 y.o. female diagnosed in 08/2012 with stage I, invasive ductal carcinoma of the right breast, estrogen receptor positive, progesterone receptor positive, HER-2/neu positive with Ki-67 of 45%.    PRIOR THERAPY: 1.  Cyndy Freeze herself palpated a mass approximately 6 months after her annual mammogram.  Diagnostic mammogram on 09/11/2012 showed a 7 mm mass at the 12 o'clock position. Ultrasound showed the mass to be 7 mm at the 12 o'clock position.  Right breast needle core biopsy of the 12 o'clock position on 09/12/2012  showed invasive mammary carcinoma with lobular features, grade 2, estrogen receptor 100% positive, progesterone receptor 100% positive, Ki-67 45%, HER-2/neu by CISH showed amplification with ratio 2.96.  Bilateral breast MRI on 09/18/2012 showed a 1.5 x 1.7 x 1.5 cm irregular mass in the right breast with no mass or abnormal enhancements in the left breast or lymph nodes (clinical stage I, T1 N0).   2.  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  3.  Adjuvant chemotherapy consisting of TCH (Taxotere/Carboplatin/Herceptin) originally scheduled to start on 11/19/2012 however the patient suffered a fall on 11/06/2012 and subsequently underwent arm fracture repair surgery.  She received three weeks of Herceptin therapy that started on 11/19/2012 and adjuvant chemotherapy consisting of TCH started on 12/10/2012.   CURRENT THERAPY:  TCH cycle 3 day 8 (weekly Herceptin)   INTERVAL HISTORY: Angela Rosario is a 72 y.o. female who returns for evaluation following cycle 3 of TCH.  She is not feeling well today.  She has been feeling sick since last Thursday, December 11.  It started out as a cold with nasal drainage, a non-productive cough, fevers, and mild intermittent nausea.  Her sister who helps take care of her had a cold a week ago.  She denies skin changes.  She had minimal diarrhea Saturday, and today. Otherwise, she is feeling moderately well and a 10 point ROS is negative.   MEDICAL HISTORY: Past Medical History  Diagnosis Date  . Syncope   . Diastolic dysfunction   . Hyperlipidemia   . Episode of dizziness     Mild episodes  . Orthostatic hypotension     Some episodes  . Arthritis     oa  . Stress incontinence, female     wears pads  . AC (acromioclavicular) joint bone spurs     bone spurs in neck  . 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 Diabetes mellitus     niddm; last A1C 6.2  . Skin cancer   . Hypertension   . Wears glasses     ALLERGIES:   Allergies  Allergen Reactions  . Oxycodone Other (See Comments)    hallucinations    MEDICATIONS:  Current Outpatient Prescriptions  Medication Sig Dispense Refill  . carvedilol (COREG) 6.25 MG tablet Take 1 tablet (  6.25 mg total) by mouth 2 (two) times daily with a meal.  180 tablet  3  . DM-Phenylephrine-Acetaminophen (TYLENOL COLD MULTI-SYMPTOM DAY PO) Take 2 tablets by mouth 3 (three) times daily.      Marland Kitchen glyBURIDE-metformin (GLUCOVANCE) 2.5-500 MG per tablet Take 1 tablet by mouth daily with breakfast.       . lidocaine-prilocaine (EMLA) cream Apply topically as needed.  30 g  1  . lovastatin (MEVACOR) 10 MG tablet Take 10 mg by mouth at bedtime.       . Multiple  Vitamin (MULTIVITAMIN) capsule Take 1 capsule by mouth daily.      . ondansetron (ZOFRAN) 8 MG tablet Take 1 tablet (8 mg total) by mouth 2 (two) times daily. Take two times a day starting the day after chemo for 3 days. Then take two times a day as needed for nausea or vomiting.  30 tablet  1  . tretinoin (RETIN-A) 0.025 % cream Apply topically at bedtime.      Marland Kitchen UNABLE TO FIND Rx: L8015-Post Mastectomy Camisole (Quantity: 2); L8000-Post Surgical Bras (Quantity: 6) Dx: 174.9 Right partial mastectomy  1 each  0  . 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  1 Units  0  . Alpha-Lipoic Acid 600 MG CAPS Take 1 each by mouth daily.      . Alum & Mag Hydroxide-Simeth (MAGIC MOUTHWASH W/LIDOCAINE) SOLN Take 5 mLs by mouth 4 (four) times daily as needed. Swish and spit, do not swallow  100 mL  4  . dexamethasone (DECADRON) 4 MG tablet Take 2 tablets (8 mg total) by mouth 2 (two) times daily with a meal. Take two times a day the day before Taxotere. Then take two times a day starting the day after chemo for 3 days.  30 tablet  1  . fluorometholone (FML) 0.1 % ophthalmic suspension       . HYDROcodone-acetaminophen (NORCO) 5-325 MG per tablet 1-2 tabs po q6 hours prn pain  40 tablet  0  . LORazepam (ATIVAN) 0.5 MG tablet Take 1 tablet (0.5 mg total) by mouth every 6 (six) hours as needed (Nausea or vomiting).  30 tablet  0  . prochlorperazine (COMPAZINE) 10 MG tablet Take 1 tablet (10 mg total) by mouth every 6 (six) hours as needed (Nausea or vomiting).  30 tablet  1   No current facility-administered medications for this visit.   Facility-Administered Medications Ordered in Other Visits  Medication Dose Route Frequency Provider Last Rate Last Dose  . gentamicin (GARAMYCIN) 160 mg in dextrose 5 % 50 mL IVPB  160 mg Intravenous 30 min Pre-Op Marcine Matar, MD        SURGICAL HISTORY:  Past Surgical History  Procedure Laterality Date  . Other surgical history       Hysterectomy  . Cholecystectomy  1992  . Abdominal hysterectomy  1985    1 ovary removed  . Total hip arthroplasty  02/23/2011    Procedure: TOTAL HIP ARTHROPLASTY;  Surgeon: Gus Rankin Aluisio;  Location: [facility];  Service: Orthopedics;  Laterality: Left;  . Pubovaginal sling  02/03/2012    Procedure: Leonides Grills;  Surgeon: Marcine Matar, MD;  Location[facility];  Service: Urology;  Laterality: N/A;  1 HR  LYNX SLING  . Tonsillectomy    . Breast lumpectomy with needle localization and axillary sentinel lymph node bx Right 10/04/2012    Procedure: RIGHT BREAST LUMPECTOMY WITH NEEDLE LOCALIZATION AND AXILLARY SENTINEL LYMPH NODE  BX;  Surgeon: Emelia Loron, MD;  Location: [facility];  Service: General;  Laterality: Right;  . Portacath placement Left 10/04/2012    Procedure: INSERTION PORT-A-CATH;  Surgeon: Emelia Loron, MD;  Location: [facility];  Service: General;  Laterality: Left;  . Orif wrist fracture Left 11/06/2012    Procedure: LEFT OPEN REDUCTION INTERNAL FIXATION (ORIF) DISTAL RADIUS WRIST FRACTURE;  Surgeon: Tami Ribas, MD;  Location: [facility];  Service: Orthopedics;  Laterality: Left;    REVIEW OF SYSTEMS:  A 10 point review of systems was completed except as noted above.   PHYSICAL EXAMINATION: Blood pressure 129/84, pulse 103, temperature 100.9 F (38.3 C), temperature source Oral, resp. rate 20, height 5\' 6"  (1.676 m), weight 174 lb 12.8 oz (79.289 kg). Body mass index is 28.23 kg/(m^2). General: Patient is a well appearing female in no acute distress HEENT: PERRLA, sclerae anicteric no conjunctival pallor, MMM Neck: supple, no palpable adenopathy Lungs: clear to auscultation bilaterally, no wheezes, rhonchi, or rales Cardiovascular: regular rate rhythm, S1, S2, no murmurs, rubs or gallops Abdomen: Soft, non-tender, non-distended, normoactive bowel sounds, no HSM Extremities: warm and well  perfused, no clubbing, cyanosis, or edema Skin: No rashes or lesions Neuro: Non-focal ECOG PERFORMANCE STATUS: 1 - Symptomatic but completely ambulatory    LABORATORY DATA: Lab Results  Component Value Date   WBC 10.0 01/28/2013   HGB 12.3 01/28/2013   HCT 36.4 01/28/2013   MCV 91.0 01/28/2013   PLT 234 01/28/2013      Chemistry      Component Value Date/Time   NA 136 01/21/2013 1105   NA 135 11/01/2012 1350   K 4.4 01/21/2013 1105   K 4.2 11/01/2012 1350   CL 96 11/01/2012 1350   CO2 24 01/21/2013 1105   CO2 26 11/01/2012 1350   BUN 14.3 01/21/2013 1105   BUN 10 11/01/2012 1350   CREATININE 0.8 01/21/2013 1105   CREATININE 0.64 11/01/2012 1350      Component Value Date/Time   CALCIUM 10.2 01/21/2013 1105   CALCIUM 9.7 11/01/2012 1350   ALKPHOS 150 01/21/2013 1105   ALKPHOS 77 02/17/2011 1045   AST 10 01/21/2013 1105   AST 26 02/17/2011 1045   ALT 16 01/21/2013 1105   ALT 36* 02/17/2011 1045   BILITOT 0.36 01/21/2013 1105   BILITOT 0.5 02/17/2011 1045       RADIOGRAPHIC STUDIES: No results found.   ASSESSMENT:  Angela Rosario is a 72 y.o. female:  1.  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  2.  Adjuvant chemotherapy consisting of TCH (Taxotere/Carboplatin/Herceptin) originally scheduled to start on 11/19/2012 however the patient suffered a fall on 11/06/2012 and subsequently underwent arm fracture repair surgery. She received three weeks of Herceptin therapy that started on 11/19/2012 and adjuvant chemotherapy consisting of TCH  started on 12/10/2012.  3.  Dehydration: Patient will continue to get IV fluids when she receives [facility] combination therapy  PLAN:  #1  Angela Rosario is not feeling well today.  She will proceed with Herceptin. Her CBC is stable.  I reviewed it  with her.   #2 For her fever and likely upper respiratory tract infection, I ordered a urinalysis, culture, blood cultures x 2, and a chest x ray.  I prescribed a Zpak, after discussion with Dr. Welton Flakes.  #3 She will receive IV fluids today in the treatment room.  I also ordered Zofran at the patient's request since she is feeling nauseated right now.  #4 She will return next week for labs and evaluation along with weekly Herceptin.    All questions answered.  Angela Rosario and her sister Angela Rosario were encouraged to contact us in the interim with any questions, concerns, or problems.  I spent 25 minutes counseling the patient face to face.  The total time spent in the appointment was 30 minutes.  Illa Level, NP Medical Oncology [facility] [phone] 01/28/2013, 3:05 PM

## 2013-01-28 NOTE — Telephone Encounter (Signed)
, °

## 2013-01-29 ENCOUNTER — Telehealth: Payer: Self-pay | Admitting: *Deleted

## 2013-01-29 ENCOUNTER — Ambulatory Visit: Payer: Medicare Other

## 2013-01-29 DIAGNOSIS — C50119 Malignant neoplasm of central portion of unspecified female breast: Secondary | ICD-10-CM

## 2013-01-29 NOTE — Telephone Encounter (Signed)
POF sent 

## 2013-01-29 NOTE — Telephone Encounter (Signed)
Eber Jones called concerning results from chest xray, will review with povrider and call pt with additional information

## 2013-01-29 NOTE — Telephone Encounter (Signed)
Message copied by Cooper Render on Tue Jan 29, 2013 12:37 PM ------      Message from: Illa Level      Created: Tue Jan 29, 2013  9:41 AM      Regarding: RE: due for echo       Patient had echo 8/19, didn't start Herceptin until October.  Please refer her to Dr. Gala Romney.              Thanks,       Mardella Layman      ----- Message -----         From: Jolinda Croak, RN         Sent: 01/29/2013   8:50 AM           To: Victorino December, MD, Illa Level, NP      Subject: due for echo                                             Call from [facility] in Pharmacy, pt is due for echo.      Thanks,      Corrie Dandy       ------

## 2013-01-30 ENCOUNTER — Telehealth: Payer: Self-pay | Admitting: Oncology

## 2013-01-30 LAB — URINE CULTURE

## 2013-01-30 NOTE — Telephone Encounter (Signed)
Xray reviewed by Provider, called pt advised per MD, Chest Xray looks good. Pt verbalized understanding. No further concerns

## 2013-02-01 ENCOUNTER — Ambulatory Visit: Payer: Medicare Other | Admitting: Adult Health

## 2013-02-01 ENCOUNTER — Other Ambulatory Visit: Payer: Medicare Other

## 2013-02-03 LAB — CULTURE, BLOOD (SINGLE)

## 2013-02-04 ENCOUNTER — Ambulatory Visit (HOSPITAL_BASED_OUTPATIENT_CLINIC_OR_DEPARTMENT_OTHER): Payer: Medicare Other | Admitting: Adult Health

## 2013-02-04 ENCOUNTER — Ambulatory Visit (HOSPITAL_BASED_OUTPATIENT_CLINIC_OR_DEPARTMENT_OTHER): Payer: Medicare Other

## 2013-02-04 ENCOUNTER — Other Ambulatory Visit (HOSPITAL_BASED_OUTPATIENT_CLINIC_OR_DEPARTMENT_OTHER): Payer: Medicare Other

## 2013-02-04 ENCOUNTER — Encounter: Payer: Self-pay | Admitting: Adult Health

## 2013-02-04 VITALS — BP 124/72 | HR 87 | Temp 98.3°F | Resp 20 | Ht 66.0 in | Wt 180.1 lb

## 2013-02-04 DIAGNOSIS — Z170 Estrogen receptor positive status [ER+]: Secondary | ICD-10-CM

## 2013-02-04 DIAGNOSIS — C50919 Malignant neoplasm of unspecified site of unspecified female breast: Secondary | ICD-10-CM

## 2013-02-04 DIAGNOSIS — C50111 Malignant neoplasm of central portion of right female breast: Secondary | ICD-10-CM

## 2013-02-04 DIAGNOSIS — Z5112 Encounter for antineoplastic immunotherapy: Secondary | ICD-10-CM

## 2013-02-04 DIAGNOSIS — E860 Dehydration: Secondary | ICD-10-CM

## 2013-02-04 LAB — COMPREHENSIVE METABOLIC PANEL (CC13)
Albumin: 3.3 g/dL — ABNORMAL LOW (ref 3.5–5.0)
Alkaline Phosphatase: 165 U/L — ABNORMAL HIGH (ref 40–150)
Anion Gap: 10 mEq/L (ref 3–11)
BUN: 6.9 mg/dL — ABNORMAL LOW (ref 7.0–26.0)
Chloride: 104 mEq/L (ref 98–109)
Creatinine: 0.7 mg/dL (ref 0.6–1.1)
Glucose: 185 mg/dl — ABNORMAL HIGH (ref 70–140)
Sodium: 140 mEq/L (ref 136–145)
Total Bilirubin: 0.53 mg/dL (ref 0.20–1.20)
Total Protein: 6.4 g/dL (ref 6.4–8.3)

## 2013-02-04 LAB — CBC WITH DIFFERENTIAL/PLATELET
BASO%: 0.4 % (ref 0.0–2.0)
Eosinophils Absolute: 0 10*3/uL (ref 0.0–0.5)
HCT: 31.8 % — ABNORMAL LOW (ref 34.8–46.6)
LYMPH%: 18.8 % (ref 14.0–49.7)
MCHC: 32.7 g/dL (ref 31.5–36.0)
MCV: 91.6 fL (ref 79.5–101.0)
MONO%: 6.6 % (ref 0.0–14.0)
NEUT%: 74.1 % (ref 38.4–76.8)
Platelets: 293 10*3/uL (ref 145–400)
RBC: 3.47 10*6/uL — ABNORMAL LOW (ref 3.70–5.45)

## 2013-02-04 MED ORDER — SODIUM CHLORIDE 0.9 % IV SOLN
1000.0000 mL | Freq: Once | INTRAVENOUS | Status: AC
  Administered 2013-02-04: 1000 mL via INTRAVENOUS

## 2013-02-04 MED ORDER — SODIUM CHLORIDE 0.9 % IV SOLN
Freq: Once | INTRAVENOUS | Status: AC
  Administered 2013-02-04: 11:00:00 via INTRAVENOUS

## 2013-02-04 MED ORDER — ACETAMINOPHEN 325 MG PO TABS
650.0000 mg | ORAL_TABLET | Freq: Once | ORAL | Status: AC
  Administered 2013-02-04: 650 mg via ORAL

## 2013-02-04 MED ORDER — SODIUM CHLORIDE 0.9 % IJ SOLN
10.0000 mL | INTRAMUSCULAR | Status: DC | PRN
  Administered 2013-02-04: 10 mL
  Filled 2013-02-04: qty 10

## 2013-02-04 MED ORDER — DIPHENHYDRAMINE HCL 25 MG PO CAPS
ORAL_CAPSULE | ORAL | Status: AC
  Filled 2013-02-04: qty 2

## 2013-02-04 MED ORDER — HEPARIN SOD (PORK) LOCK FLUSH 100 UNIT/ML IV SOLN
500.0000 [IU] | Freq: Once | INTRAVENOUS | Status: AC | PRN
  Administered 2013-02-04: 500 [IU]
  Filled 2013-02-04: qty 5

## 2013-02-04 MED ORDER — TRASTUZUMAB CHEMO INJECTION 440 MG
2.0000 mg/kg | Freq: Once | INTRAVENOUS | Status: AC
  Administered 2013-02-04: 168 mg via INTRAVENOUS
  Filled 2013-02-04: qty 8

## 2013-02-04 MED ORDER — ACETAMINOPHEN 325 MG PO TABS
ORAL_TABLET | ORAL | Status: AC
  Filled 2013-02-04: qty 2

## 2013-02-04 MED ORDER — DIPHENHYDRAMINE HCL 25 MG PO CAPS
50.0000 mg | ORAL_CAPSULE | Freq: Once | ORAL | Status: AC
  Administered 2013-02-04: 50 mg via ORAL

## 2013-02-04 NOTE — Progress Notes (Signed)
z  [facility]  Telephone:(336) [phone] Fax:([phone]  OFFICE PROGRESS NOTE  ID: Angela Rosario   DOB: 23-Aug-1940  MR#: [id number]  JYN#:[id number]   PCP: [name], MD SU: Emelia Loron, M.D.   DIAGNOSIS:  Angela Rosario is a 72 y.o. female diagnosed in 08/2012 with stage I, invasive ductal carcinoma of the right breast, estrogen receptor positive, progesterone receptor positive, HER-2/neu positive with Ki-67 of 45%.    PRIOR THERAPY: 1.  Angela Rosario herself palpated a mass approximately 6 months after her annual mammogram.  Diagnostic mammogram on 09/11/2012 showed a 7 mm mass at the 12 o'clock position. Ultrasound showed the mass to be 7 mm at the 12 o'clock position.  Right breast needle core biopsy of the 12 o'clock position on 09/12/2012  showed invasive mammary carcinoma with lobular features, grade 2, estrogen receptor 100% positive, progesterone receptor 100% positive, Ki-67 45%, HER-2/neu by CISH showed amplification with ratio 2.96.  Bilateral breast MRI on 09/18/2012 showed a 1.5 x 1.7 x 1.5 cm irregular mass in the right breast with no mass or abnormal enhancements in the left breast or lymph nodes (clinical stage I, T1 N0).   2.  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  3.  Adjuvant chemotherapy consisting of TCH (Taxotere/Carboplatin/Herceptin) originally scheduled to start on 11/19/2012 however the patient suffered a fall on 11/06/2012 and subsequently underwent arm fracture repair  surgery. She received three weeks of Herceptin therapy that started on 11/19/2012 and adjuvant chemotherapy consisting of TCH started on 12/10/2012.   CURRENT THERAPY:  TCH cycle 3 day 15 (weekly Herceptin)   INTERVAL HISTORY: Angela Rosario is a 72 y.o. female who returns for evaluation following prior to her weekly Herceptin.  She is feeling improved since last week.  She continues to work on hydrating herself by drinking fluids and gatorade.  She has not had any further fevers.  Her nausea, vomiting has improved greatly.  She still feels like she is catching up with her hydration, and is requesting more IV fluids today, as they help her feel much better.  She denies fevers, chills, diarrhea, constipation, numbness, or any further concerns.     MEDICAL HISTORY: Past Medical History  Diagnosis Date  . Syncope   . Diastolic dysfunction   . Hyperlipidemia   . Episode of dizziness     Mild episodes  . Orthostatic hypotension     Some episodes  . Arthritis     oa  . Stress incontinence, female     wears pads  . AC (acromioclavicular) joint bone spurs     bone spurs in neck  . 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 Diabetes mellitus     niddm; last A1C 6.2  . Skin cancer   . Hypertension   . Wears glasses     ALLERGIES:   Allergies  Allergen Reactions  . Oxycodone Other (See Comments)    hallucinations    MEDICATIONS:  Current Outpatient Prescriptions  Medication Sig Dispense Refill  . Alpha-Lipoic Acid 600 MG CAPS Take 1  each by mouth daily.      . Alum & Mag Hydroxide-Simeth (MAGIC MOUTHWASH W/LIDOCAINE) SOLN Take 5 mLs by mouth 4 (four) times daily as needed. Swish and spit, do not swallow  100 mL  4  . azithromycin (ZITHROMAX) 250 MG tablet 2 tabs po x 1 on day 1, then one tab daily  6 each  0  . carvedilol (COREG) 6.25 MG tablet Take 1 tablet (6.25 mg total) by mouth 2 (two) times daily with a meal.  180 tablet  3  . dexamethasone (DECADRON) 4  MG tablet Take 2 tablets (8 mg total) by mouth 2 (two) times daily with a meal. Take two times a day the day before Taxotere. Then take two times a day starting the day after chemo for 3 days.  30 tablet  1  . DM-Phenylephrine-Acetaminophen (TYLENOL COLD MULTI-SYMPTOM DAY PO) Take 2 tablets by mouth 3 (three) times daily.      . fluorometholone (FML) 0.1 % ophthalmic suspension       . glyBURIDE-metformin (GLUCOVANCE) 2.5-500 MG per tablet Take 1 tablet by mouth daily with breakfast.       . HYDROcodone-acetaminophen (NORCO) 5-325 MG per tablet 1-2 tabs po q6 hours prn pain  40 tablet  0  . lidocaine-prilocaine (EMLA) cream Apply topically as needed.  30 g  1  . LORazepam (ATIVAN) 0.5 MG tablet Take 1 tablet (0.5 mg total) by mouth every 6 (six) hours as needed (Nausea or vomiting).  30 tablet  0  . lovastatin (MEVACOR) 10 MG tablet Take 10 mg by mouth at bedtime.       . Multiple Vitamin (MULTIVITAMIN) capsule Take 1 capsule by mouth daily.      . ondansetron (ZOFRAN) 8 MG tablet Take 1 tablet (8 mg total) by mouth 2 (two) times daily. Take two times a day starting the day after chemo for 3 days. Then take two times a day as needed for nausea or vomiting.  30 tablet  1  . prochlorperazine (COMPAZINE) 10 MG tablet Take 1 tablet (10 mg total) by mouth every 6 (six) hours as needed (Nausea or vomiting).  30 tablet  1  . tretinoin (RETIN-A) 0.025 % cream Apply topically at bedtime.      Marland Kitchen UNABLE TO FIND Rx: L8015-Post Mastectomy Camisole (Quantity: 2); L8000-Post Surgical Bras (Quantity: 6) Dx: 174.9 Right partial mastectomy  1 each  0  . 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  1 Units  0   No current facility-administered medications for this visit.   Facility-Administered Medications Ordered in Other Visits  Medication Dose Route Frequency Provider Last Rate Last Dose  . gentamicin (GARAMYCIN) 160 mg in dextrose 5 % 50 mL IVPB  160 mg Intravenous 30 min Pre-Op Marcine Matar, MD        SURGICAL HISTORY:  Past Surgical History  Procedure Laterality Date  . Other surgical history      Hysterectomy  . Cholecystectomy  1992  . Abdominal hysterectomy  1985    1 ovary removed  . Total hip arthroplasty  02/23/2011    Procedure: TOTAL HIP ARTHROPLASTY;  Surgeon: Gus Rankin Aluisio;  Location: [facility];  Service: Orthopedics;  Laterality: Left;  . Pubovaginal sling  02/03/2012    Procedure: Leonides Grills;  Surgeon: Marcine Matar, MD;  Location: [facility];  Service: Urology;  Laterality: N/A;  1 HR  LYNX SLING  . Tonsillectomy    . Breast lumpectomy with  needle localization and axillary sentinel lymph node bx Right 10/04/2012    Procedure: RIGHT BREAST LUMPECTOMY WITH NEEDLE LOCALIZATION AND AXILLARY SENTINEL LYMPH NODE BX;  Surgeon: Emelia Loron, MD;  Location: [facility];  Service: General;  Laterality: Right;  . Portacath placement Left 10/04/2012    Procedure: INSERTION PORT-A-CATH;  Surgeon: Emelia Loron, MD;  Location: [facility];  Service: General;  Laterality: Left;  . Orif wrist fracture Left 11/06/2012    Procedure: LEFT OPEN REDUCTION INTERNAL FIXATION (ORIF) DISTAL RADIUS WRIST FRACTURE;  Surgeon: Tami Ribas, MD;  Location: [facility];  Service: Orthopedics;  Laterality: Left;    REVIEW OF SYSTEMS:  A 10 point review of systems was completed except as noted above.   PHYSICAL EXAMINATION: Blood pressure 124/72, pulse 87, temperature 98.3 F (36.8 C), temperature source Oral, resp. rate 20, height 5\' 6"  (1.676 m), weight 180 lb 1.6 oz (81.693 kg). Body mass index is 29.08 kg/(m^2). General: Patient is a well appearing female in no acute distress HEENT: PERRLA, sclerae anicteric no conjunctival pallor, MMM Neck: supple, no palpable adenopathy Lungs: clear to auscultation bilaterally, no wheezes, rhonchi, or rales Cardiovascular: regular rate rhythm, S1, S2, no murmurs,  rubs or gallops Abdomen: Soft, non-tender, non-distended, normoactive bowel sounds, no HSM Extremities: warm and well perfused, no clubbing, cyanosis, or edema Skin: No rashes or lesions Neuro: Non-focal ECOG PERFORMANCE STATUS: 1 - Symptomatic but completely ambulatory    LABORATORY DATA: Lab Results  Component Value Date   WBC 10.8* 02/04/2013   HGB 10.4* 02/04/2013   HCT 31.8* 02/04/2013   MCV 91.6 02/04/2013   PLT 293 02/04/2013      Chemistry      Component Value Date/Time   NA 140 02/04/2013 0905   NA 135 11/01/2012 1350   K 3.7 02/04/2013 0905   K 4.2 11/01/2012 1350   CL 96 11/01/2012 1350   CO2 26 02/04/2013 0905   CO2 26 11/01/2012 1350   BUN 6.9* 02/04/2013 0905   BUN 10 11/01/2012 1350   CREATININE 0.7 02/04/2013 0905   CREATININE 0.64 11/01/2012 1350      Component Value Date/Time   CALCIUM 9.2 02/04/2013 0905   CALCIUM 9.7 11/01/2012 1350   ALKPHOS 165* 02/04/2013 0905   ALKPHOS 77 02/17/2011 1045   AST 18 02/04/2013 0905   AST 26 02/17/2011 1045   ALT 29 02/04/2013 0905   ALT 36* 02/17/2011 1045   BILITOT 0.53 02/04/2013 0905   BILITOT 0.5 02/17/2011 1045       RADIOGRAPHIC STUDIES: No results found.   ASSESSMENT:  Angela Rosario is a 72 y.o. female:  1.  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  2.  Adjuvant chemotherapy consisting of TCH (Taxotere/Carboplatin/Herceptin) originally scheduled to start on 11/19/2012 however the patient suffered a fall on 11/06/2012 and subsequently underwent arm fracture  repair surgery. She received three weeks of Herceptin therapy that started on 11/19/2012 and adjuvant chemotherapy consisting of TCH started on 12/10/2012.  3.  Dehydration: Patient will continue to get IV fluids when she receives [facility] combination therapy, along  with when she receives her weekly Herceptin.    PLAN:  #1   Patient is doing better today.  Her CBC is stable, I reviewed the results with her in detail.  She will proceed with Herceptin.   #2 We have requested f/u with Dr. Prescott Gum office as of 01/29/13.  We continue to await its' scheduling.    #3 She will return to [place] in one week for labs, evaluation, and TCH therapy.  I have requested an appointment for additional IV fluids on 1/3.     All questions answered.  Ms. Cibrian and her sister Eber Jones were encouraged to contact us in the interim with any questions, concerns, or problems.  I spent 25 minutes counseling the patient face to face.  The total time spent in the appointment was 30 minutes.  Illa Level, NP Medical Oncology [facility] [phone] 02/05/2013, 12:10 PM

## 2013-02-04 NOTE — Patient Instructions (Signed)
[facility] Discharge Instructions for Patients Receiving Chemotherapy  Today you received the following chemotherapy agents: Herceptin  To help prevent nausea and vomiting after your treatment, we encourage you to take your nausea medication: Compazine 10 mg every 6 hrs as needed.    If you develop nausea and vomiting that is not controlled by your nausea medication, call the clinic.   BELOW ARE SYMPTOMS THAT SHOULD BE REPORTED IMMEDIATELY:  *FEVER GREATER THAN 100.5 F  *CHILLS WITH OR WITHOUT FEVER  NAUSEA AND VOMITING THAT IS NOT CONTROLLED WITH YOUR NAUSEA MEDICATION  *UNUSUAL SHORTNESS OF BREATH  *UNUSUAL BRUISING OR BLEEDING  TENDERNESS IN MOUTH AND THROAT WITH OR WITHOUT PRESENCE OF ULCERS  *URINARY PROBLEMS  *BOWEL PROBLEMS  UNUSUAL RASH Items with * indicate a potential emergency and should be followed up as soon as possible.  Feel free to call the clinic you have any questions or concerns. The clinic phone number is [phone].    

## 2013-02-05 ENCOUNTER — Telehealth: Payer: Self-pay | Admitting: Oncology

## 2013-02-11 ENCOUNTER — Ambulatory Visit (HOSPITAL_BASED_OUTPATIENT_CLINIC_OR_DEPARTMENT_OTHER): Payer: Medicare Other

## 2013-02-11 ENCOUNTER — Other Ambulatory Visit: Payer: Medicare Other | Admitting: Lab

## 2013-02-11 ENCOUNTER — Ambulatory Visit: Payer: Medicare Other | Admitting: Family

## 2013-02-11 ENCOUNTER — Ambulatory Visit (HOSPITAL_BASED_OUTPATIENT_CLINIC_OR_DEPARTMENT_OTHER): Payer: Medicare Other | Admitting: Adult Health

## 2013-02-11 ENCOUNTER — Other Ambulatory Visit (HOSPITAL_BASED_OUTPATIENT_CLINIC_OR_DEPARTMENT_OTHER): Payer: Medicare Other

## 2013-02-11 ENCOUNTER — Telehealth: Payer: Self-pay | Admitting: Oncology

## 2013-02-11 ENCOUNTER — Encounter: Payer: Self-pay | Admitting: Adult Health

## 2013-02-11 ENCOUNTER — Telehealth: Payer: Self-pay | Admitting: *Deleted

## 2013-02-11 VITALS — BP 129/76 | HR 97 | Temp 98.6°F | Resp 18 | Ht 66.0 in | Wt 175.8 lb

## 2013-02-11 DIAGNOSIS — C50111 Malignant neoplasm of central portion of right female breast: Secondary | ICD-10-CM

## 2013-02-11 DIAGNOSIS — Z5112 Encounter for antineoplastic immunotherapy: Secondary | ICD-10-CM

## 2013-02-11 DIAGNOSIS — Z170 Estrogen receptor positive status [ER+]: Secondary | ICD-10-CM

## 2013-02-11 DIAGNOSIS — C50119 Malignant neoplasm of central portion of unspecified female breast: Secondary | ICD-10-CM

## 2013-02-11 DIAGNOSIS — E860 Dehydration: Secondary | ICD-10-CM

## 2013-02-11 DIAGNOSIS — Z5111 Encounter for antineoplastic chemotherapy: Secondary | ICD-10-CM

## 2013-02-11 LAB — CBC WITH DIFFERENTIAL/PLATELET
BASO%: 0.1 % (ref 0.0–2.0)
Basophils Absolute: 0 10*3/uL (ref 0.0–0.1)
Eosinophils Absolute: 0 10*3/uL (ref 0.0–0.5)
HCT: 32 % — ABNORMAL LOW (ref 34.8–46.6)
HGB: 10.6 g/dL — ABNORMAL LOW (ref 11.6–15.9)
LYMPH%: 15 % (ref 14.0–49.7)
MCHC: 33.1 g/dL (ref 31.5–36.0)
MONO#: 0.6 10*3/uL (ref 0.1–0.9)
NEUT#: 8.4 10*3/uL — ABNORMAL HIGH (ref 1.5–6.5)
NEUT%: 78.9 % — ABNORMAL HIGH (ref 38.4–76.8)
Platelets: 395 10*3/uL (ref 145–400)
RBC: 3.45 10*6/uL — ABNORMAL LOW (ref 3.70–5.45)
WBC: 10.7 10*3/uL — ABNORMAL HIGH (ref 3.9–10.3)
lymph#: 1.6 10*3/uL (ref 0.9–3.3)

## 2013-02-11 LAB — COMPREHENSIVE METABOLIC PANEL (CC13)
ALT: 16 U/L (ref 0–55)
Anion Gap: 12 mEq/L — ABNORMAL HIGH (ref 3–11)
BUN: 13.9 mg/dL (ref 7.0–26.0)
CO2: 23 mEq/L (ref 22–29)
Calcium: 10 mg/dL (ref 8.4–10.4)
Chloride: 103 mEq/L (ref 98–109)
Creatinine: 0.8 mg/dL (ref 0.6–1.1)
Glucose: 321 mg/dl — ABNORMAL HIGH (ref 70–140)
Total Bilirubin: 0.36 mg/dL (ref 0.20–1.20)

## 2013-02-11 MED ORDER — DEXAMETHASONE SODIUM PHOSPHATE 20 MG/5ML IJ SOLN
INTRAMUSCULAR | Status: AC
  Filled 2013-02-11: qty 5

## 2013-02-11 MED ORDER — ACETAMINOPHEN 325 MG PO TABS
650.0000 mg | ORAL_TABLET | Freq: Once | ORAL | Status: AC
  Administered 2013-02-11: 650 mg via ORAL

## 2013-02-11 MED ORDER — ACETAMINOPHEN 325 MG PO TABS
ORAL_TABLET | ORAL | Status: AC
  Filled 2013-02-11: qty 2

## 2013-02-11 MED ORDER — PALONOSETRON HCL INJECTION 0.25 MG/5ML
INTRAVENOUS | Status: AC
  Filled 2013-02-11: qty 5

## 2013-02-11 MED ORDER — SODIUM CHLORIDE 0.9 % IJ SOLN
10.0000 mL | INTRAMUSCULAR | Status: DC | PRN
  Administered 2013-02-11: 10 mL
  Filled 2013-02-11: qty 10

## 2013-02-11 MED ORDER — DIPHENHYDRAMINE HCL 25 MG PO CAPS
50.0000 mg | ORAL_CAPSULE | Freq: Once | ORAL | Status: AC
  Administered 2013-02-11: 50 mg via ORAL

## 2013-02-11 MED ORDER — SODIUM CHLORIDE 0.9 % IV SOLN
Freq: Once | INTRAVENOUS | Status: AC
  Administered 2013-02-11: 12:00:00 via INTRAVENOUS

## 2013-02-11 MED ORDER — HEPARIN SOD (PORK) LOCK FLUSH 100 UNIT/ML IV SOLN
500.0000 [IU] | Freq: Once | INTRAVENOUS | Status: AC | PRN
  Administered 2013-02-11: 500 [IU]
  Filled 2013-02-11: qty 5

## 2013-02-11 MED ORDER — DIPHENHYDRAMINE HCL 25 MG PO CAPS
ORAL_CAPSULE | ORAL | Status: AC
  Filled 2013-02-11: qty 2

## 2013-02-11 MED ORDER — DEXAMETHASONE SODIUM PHOSPHATE 20 MG/5ML IJ SOLN
12.0000 mg | Freq: Once | INTRAMUSCULAR | Status: AC
  Administered 2013-02-11: 12:00:00 via INTRAVENOUS

## 2013-02-11 MED ORDER — SODIUM CHLORIDE 0.9 % IV SOLN
150.0000 mg | Freq: Once | INTRAVENOUS | Status: AC
  Administered 2013-02-11: 150 mg via INTRAVENOUS
  Filled 2013-02-11: qty 5

## 2013-02-11 MED ORDER — SODIUM CHLORIDE 0.9 % IV SOLN
75.0000 mg/m2 | Freq: Once | INTRAVENOUS | Status: AC
  Administered 2013-02-11: 150 mg via INTRAVENOUS
  Filled 2013-02-11: qty 15

## 2013-02-11 MED ORDER — TRASTUZUMAB CHEMO INJECTION 440 MG
2.0000 mg/kg | Freq: Once | INTRAVENOUS | Status: AC
  Administered 2013-02-11: 168 mg via INTRAVENOUS
  Filled 2013-02-11: qty 8

## 2013-02-11 MED ORDER — PALONOSETRON HCL INJECTION 0.25 MG/5ML
0.2500 mg | Freq: Once | INTRAVENOUS | Status: AC
  Administered 2013-02-11: 0.25 mg via INTRAVENOUS

## 2013-02-11 MED ORDER — CARBOPLATIN CHEMO INJECTION 600 MG/60ML
567.6000 mg | Freq: Once | INTRAVENOUS | Status: AC
  Administered 2013-02-11: 570 mg via INTRAVENOUS
  Filled 2013-02-11: qty 57

## 2013-02-11 NOTE — Telephone Encounter (Signed)
Per staff message and POF I have scheduled appts.  JMW  

## 2013-02-11 NOTE — Telephone Encounter (Signed)
, °

## 2013-02-11 NOTE — Progress Notes (Signed)
z  [name]  Telephone:(336) [phone] Fax:(336) [phone]  OFFICE PROGRESS NOTE  ID: Angela Rosario   DOB: December 26, 1940  MR#: [id number]  MVH#:[id number]   PCP: [name], MD SU: Emelia Loron, M.D.   DIAGNOSIS:  Angela Rosario is a 72 y.o. female diagnosed in 08/2012 with stage I, invasive ductal carcinoma of the right breast, estrogen receptor positive, progesterone receptor positive, HER-2/neu positive with Ki-67 of 45%.    PRIOR THERAPY: 1.  Cyndy Freeze herself palpated a mass approximately 6 months after her annual mammogram.  Diagnostic mammogram on 09/11/2012 showed a 7 mm mass at the 12 o'clock position. Ultrasound showed the mass to be 7 mm at the 12 o'clock position.  Right breast needle core biopsy of the 12 o'clock position on 09/12/2012  showed invasive mammary carcinoma with lobular features, grade 2, estrogen receptor 100% positive, progesterone receptor 100% positive, Ki-67 45%, HER-2/neu by CISH showed amplification with ratio 2.96.  Bilateral breast MRI on 09/18/2012 showed a 1.5 x 1.7 x 1.5 cm irregular mass in the right breast with no mass or abnormal enhancements in the left breast or lymph nodes (clinical stage I, T1 N0).   2.  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  3.  Adjuvant chemotherapy consisting of TCH (Taxotere/Carboplatin/Herceptin) originally scheduled to start on 11/19/2012 however the patient suffered a fall on 11/06/2012 and subsequently underwent arm fracture repair  surgery. She received three weeks of Herceptin therapy that started on 11/19/2012 and adjuvant chemotherapy consisting of TCH started on 12/10/2012.   CURRENT THERAPY:  TCH cycle 4 day 1 (weekly Herceptin)   INTERVAL HISTORY: Angela Rosario is a 72 y.o. female who returns for evaluation following prior to cycle 4 of [facility] therapy.  She has an echo ordered on 02/13/13 with Dr. Gala Romney.  She is following up with her dermatologist this week as well.  She has not been nauseated and her appetite has returned.  She does have some left breast pain, this past week, but hasn't palpated any abnormalities in the left breast.  She is requesting a referral to the ABC class.  She denies fevers, chills, nausea, vomiting, constipation, diarrhea, numbness, or any further concerns.   MEDICAL HISTORY: Past Medical History  Diagnosis Date  . Syncope   . Diastolic dysfunction   . Hyperlipidemia   . Episode of dizziness     Mild episodes  . Orthostatic hypotension     Some episodes  . Arthritis     oa  . Stress incontinence, female     wears pads  . AC (acromioclavicular) joint bone spurs     bone spurs in neck  . 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 Diabetes mellitus     niddm; last A1C 6.2  . Skin cancer   . Hypertension   . Wears glasses     ALLERGIES:   Allergies  Allergen Reactions  . Oxycodone Other (See Comments)    hallucinations    MEDICATIONS:  Current Outpatient Prescriptions  Medication Sig Dispense Refill  . carvedilol (COREG) 6.25  MG tablet Take 1 tablet (6.25 mg total) by mouth 2 (two) times daily with a meal.  180 tablet  3  . fluorometholone (FML) 0.1 % ophthalmic suspension       . glyBURIDE-metformin (GLUCOVANCE) 2.5-500 MG per tablet Take 1 tablet by mouth daily with breakfast.       . lidocaine-prilocaine (EMLA) cream Apply topically as needed.  30 g  1  . LORazepam (ATIVAN) 0.5 MG tablet Take 1 tablet (0.5 mg total) by mouth every 6 (six)  hours as needed (Nausea or vomiting).  30 tablet  0  . lovastatin (MEVACOR) 10 MG tablet Take 10 mg by mouth at bedtime.       . Multiple Vitamin (MULTIVITAMIN) capsule Take 1 capsule by mouth daily.      . ondansetron (ZOFRAN) 8 MG tablet Take 1 tablet (8 mg total) by mouth 2 (two) times daily. Take two times a day starting the day after chemo for 3 days. Then take two times a day as needed for nausea or vomiting.  30 tablet  1  . tretinoin (RETIN-A) 0.025 % cream Apply topically at bedtime.      Marland Kitchen UNABLE TO FIND Rx: L8015-Post Mastectomy Camisole (Quantity: 2); L8000-Post Surgical Bras (Quantity: 6) Dx: 174.9 Right partial mastectomy  1 each  0  . 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  1 Units  0  . Alpha-Lipoic Acid 600 MG CAPS Take 1 each by mouth daily.      . Alum & Mag Hydroxide-Simeth (MAGIC MOUTHWASH W/LIDOCAINE) SOLN Take 5 mLs by mouth 4 (four) times daily as needed. Swish and spit, do not swallow  100 mL  4  . dexamethasone (DECADRON) 4 MG tablet Take 2 tablets (8 mg total) by mouth 2 (two) times daily with a meal. Take two times a day the day before Taxotere. Then take two times a day starting the day after chemo for 3 days.  30 tablet  1  . prochlorperazine (COMPAZINE) 10 MG tablet Take 1 tablet (10 mg total) by mouth every 6 (six) hours as needed (Nausea or vomiting).  30 tablet  1   No current facility-administered medications for this visit.   Facility-Administered Medications Ordered in Other Visits  Medication Dose Route Frequency Provider Last Rate Last Dose  . gentamicin (GARAMYCIN) 160 mg in dextrose 5 % 50 mL IVPB  160 mg Intravenous 30 min Pre-Op Marcine Matar, MD        SURGICAL HISTORY:  Past Surgical History  Procedure Laterality Date  . Other surgical history      Hysterectomy  . Cholecystectomy  1992  . Abdominal hysterectomy  1985    1 ovary removed  . Total hip arthroplasty  02/23/2011    Procedure: TOTAL HIP ARTHROPLASTY;   Surgeon: Gus Rankin Aluisio;  Location: [facility];  Service: Orthopedics;  Laterality: Left;  . Pubovaginal sling  02/03/2012    Procedure: Leonides Grills;  Surgeon: Marcine Matar, MD;  Location: [facility];  Service: Urology;  Laterality: N/A;  1 HR  LYNX SLING  . Tonsillectomy    . Breast lumpectomy with needle localization and axillary sentinel lymph node bx Right 10/04/2012    Procedure: RIGHT BREAST LUMPECTOMY WITH NEEDLE LOCALIZATION AND AXILLARY SENTINEL LYMPH NODE BX;  Surgeon: Emelia Loron, MD;  Location: [facility];  Service: General;  Laterality: Right;  . Portacath placement Left 10/04/2012    Procedure: INSERTION PORT-A-CATH;  Surgeon: Emelia Loron, MD;  Location: [facility];  Service: General;  Laterality: Left;  . Orif wrist fracture Left 11/06/2012    Procedure: LEFT OPEN REDUCTION INTERNAL FIXATION (ORIF) DISTAL RADIUS WRIST FRACTURE;  Surgeon: Tami Ribas, MD;  Location: [facility];  Service: Orthopedics;  Laterality: Left;    REVIEW OF SYSTEMS:  A 10 point review of systems was completed except as noted above.   PHYSICAL EXAMINATION: Blood pressure 129/76, pulse 97, temperature 98.6 F (37 C), temperature source Oral, resp. rate 18, height 5\' 6"  (1.676 m), weight 175 lb 12.8 oz (79.742 kg). Body mass index is 28.39 kg/(m^2). General: Patient is a well appearing female in no acute distress HEENT: PERRLA, sclerae anicteric no conjunctival pallor, MMM Neck: supple, no palpable adenopathy Lungs: clear to auscultation bilaterally, no wheezes, rhonchi, or rales Cardiovascular: regular rate rhythm, S1, S2, no murmurs, rubs or gallops Abdomen: Soft, non-tender, non-distended, normoactive bowel sounds, no HSM Extremities: warm and well perfused, no clubbing, cyanosis, or edema Skin: No rashes or lesions Neuro: Non-focal Breasts: right breast lumpectomy without masses, nodularity, skin changes, or sign of  recurrence, left breast no masses lesions, skin changes, or sign of recurrence.   ECOG PERFORMANCE STATUS: 1 - Symptomatic but completely ambulatory    LABORATORY DATA: Lab Results  Component Value Date   WBC 10.7* 02/11/2013   HGB 10.6* 02/11/2013   HCT 32.0* 02/11/2013   MCV 92.8 02/11/2013   PLT 395 02/11/2013      Chemistry      Component Value Date/Time   NA 139 02/11/2013 0937   NA 135 11/01/2012 1350   K 4.1 02/11/2013 0937   K 4.2 11/01/2012 1350   CL 96 11/01/2012 1350   CO2 23 02/11/2013 0937   CO2 26 11/01/2012 1350   BUN 13.9 02/11/2013 0937   BUN 10 11/01/2012 1350   CREATININE 0.8 02/11/2013 0937   CREATININE 0.64 11/01/2012 1350      Component Value Date/Time   CALCIUM 10.0 02/11/2013 0937   CALCIUM 9.7 11/01/2012 1350   ALKPHOS 111 02/11/2013 0937   ALKPHOS 77 02/17/2011 1045   AST 11 02/11/2013 0937   AST 26 02/17/2011 1045   ALT 16 02/11/2013 0937   ALT 36* 02/17/2011 1045   BILITOT 0.36 02/11/2013 0937   BILITOT 0.5 02/17/2011 1045       RADIOGRAPHIC STUDIES: No results found.   ASSESSMENT:  JOSEFA SYRACUSE is a 72 y.o. female:  1.  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  2.  Adjuvant chemotherapy consisting of TCH (Taxotere/Carboplatin/Herceptin) originally scheduled to start on 11/19/2012 however the patient suffered a fall on 11/06/2012 and subsequently underwent arm fracture repair surgery. She received three weeks of Herceptin therapy that started on 11/19/2012 and adjuvant chemotherapy consisting of  TCH started on 12/10/2012.  3.  Dehydration: Patient will continue to get IV fluids when she receives [facility] combination therapy, along with when she receives her weekly Herceptin.    PLAN:  #1   Patient is doing well today.  Her CBC is stable, I reviewed it with her in detail.  Her CMP  is pending.  Due to here severe CINV and h/o dehydration with chemotherapy she will receive IV fluids today, 12/30, 12/31, 1/3 along with Zofran on 1/3.  I again reviewed her anti-emetic regimen with her.    #2 Patient will undergo echo on 12/31 and Dr. Gala Romney appointment on 02/20/13.  #3 She will return to [place] in one week for labs, evaluation, and Herceptin therapy.    #4 I referred her to the ABC class.    All questions answered.  Ms. Parrow and her sister Eber Jones were encouraged to contact us in the interim with any questions, concerns, or problems.  I spent 25 minutes counseling the patient face to face.  The total time spent in the appointment was 30 minutes.  Illa Level, NP Medical Oncology [facility] [phone] 02/12/2013, 11:01 AM

## 2013-02-11 NOTE — Patient Instructions (Signed)
[facility] Discharge Instructions for Patients Receiving Chemotherapy  Today you received the following chemotherapy agents: Herceptin, Taxotere, Carboplatin   To help prevent nausea and vomiting after your treatment, we encourage you to take your nausea medication as prescribed.    If you develop nausea and vomiting that is not controlled by your nausea medication, call the clinic.   BELOW ARE SYMPTOMS THAT SHOULD BE REPORTED IMMEDIATELY:  *FEVER GREATER THAN 100.5 F  *CHILLS WITH OR WITHOUT FEVER  NAUSEA AND VOMITING THAT IS NOT CONTROLLED WITH YOUR NAUSEA MEDICATION  *UNUSUAL SHORTNESS OF BREATH  *UNUSUAL BRUISING OR BLEEDING  TENDERNESS IN MOUTH AND THROAT WITH OR WITHOUT PRESENCE OF ULCERS  *URINARY PROBLEMS  *BOWEL PROBLEMS  UNUSUAL RASH Items with * indicate a potential emergency and should be followed up as soon as possible.  Feel free to call the clinic you have any questions or concerns. The clinic phone number is [phone].    

## 2013-02-12 ENCOUNTER — Ambulatory Visit (HOSPITAL_BASED_OUTPATIENT_CLINIC_OR_DEPARTMENT_OTHER): Payer: Medicare Other

## 2013-02-12 ENCOUNTER — Ambulatory Visit: Payer: Medicare Other

## 2013-02-12 ENCOUNTER — Other Ambulatory Visit: Payer: Medicare Other | Admitting: Lab

## 2013-02-12 ENCOUNTER — Ambulatory Visit: Payer: Medicare Other | Admitting: Adult Health

## 2013-02-12 VITALS — BP 116/65 | HR 69 | Temp 97.5°F | Resp 18

## 2013-02-12 DIAGNOSIS — C50119 Malignant neoplasm of central portion of unspecified female breast: Secondary | ICD-10-CM

## 2013-02-12 DIAGNOSIS — C50111 Malignant neoplasm of central portion of right female breast: Secondary | ICD-10-CM

## 2013-02-12 DIAGNOSIS — E860 Dehydration: Secondary | ICD-10-CM

## 2013-02-12 DIAGNOSIS — Z5189 Encounter for other specified aftercare: Secondary | ICD-10-CM

## 2013-02-12 MED ORDER — PEGFILGRASTIM INJECTION 6 MG/0.6ML
6.0000 mg | Freq: Once | SUBCUTANEOUS | Status: AC
  Administered 2013-02-12: 6 mg via SUBCUTANEOUS
  Filled 2013-02-12: qty 0.6

## 2013-02-12 MED ORDER — SODIUM CHLORIDE 0.9 % IV SOLN
INTRAVENOUS | Status: DC
  Administered 2013-02-12: 12:00:00 via INTRAVENOUS

## 2013-02-12 MED ORDER — SODIUM CHLORIDE 0.9 % IJ SOLN
10.0000 mL | INTRAMUSCULAR | Status: DC | PRN
  Administered 2013-02-12: 10 mL via INTRAVENOUS
  Filled 2013-02-12: qty 10

## 2013-02-12 MED ORDER — HEPARIN SOD (PORK) LOCK FLUSH 100 UNIT/ML IV SOLN
500.0000 [IU] | Freq: Once | INTRAVENOUS | Status: AC
  Administered 2013-02-12: 500 [IU] via INTRAVENOUS
  Filled 2013-02-12: qty 5

## 2013-02-12 NOTE — Patient Instructions (Addendum)
Pegfilgrastim injection What is this medicine? PEGFILGRASTIM (peg fil GRA stim) helps the body make more white blood cells. It is used to prevent infection in people with low amounts of white blood cells following cancer treatment. This medicine may be used for other purposes; ask your health care provider or pharmacist if you have questions. COMMON BRAND NAME(S): Neulasta What should I tell my health care provider before I take this medicine? They need to know if you have any of these conditions: -sickle cell disease -an unusual or allergic reaction to pegfilgrastim, filgrastim, E.coli protein, other medicines, foods, dyes, or preservatives -pregnant or trying to get pregnant -breast-feeding How should I use this medicine? This medicine is for injection under the skin. It is usually given by a health care professional in a hospital or clinic setting. If you get this medicine at home, you will be taught how to prepare and give this medicine. Do not shake this medicine. Use exactly as directed. Take your medicine at regular intervals. Do not take your medicine more often than directed. It is important that you put your used needles and syringes in a special sharps container. Do not put them in a trash can. If you do not have a sharps container, call your pharmacist or healthcare provider to get one. Talk to your pediatrician regarding the use of this medicine in children. While this drug may be prescribed for children who weigh more than 45 kg for selected conditions, precautions do apply Overdosage: If you think you have taken too much of this medicine contact a poison control center or emergency room at once. NOTE: This medicine is only for you. Do not share this medicine with others. What if I miss a dose? If you miss a dose, take it as soon as you can. If it is almost time for your next dose, take only that dose. Do not take double or extra doses. What may interact with this  medicine? -lithium -medicines for growth therapy This list may not describe all possible interactions. Give your health care provider a list of all the medicines, herbs, non-prescription drugs, or dietary supplements you use. Also tell them if you smoke, drink alcohol, or use illegal drugs. Some items may interact with your medicine. What should I watch for while using this medicine? Visit your doctor for regular check ups. You will need important blood work done while you are taking this medicine. What side effects may I notice from receiving this medicine? Side effects that you should report to your doctor or health care professional as soon as possible: -allergic reactions like skin rash, itching or hives, swelling of the face, lips, or tongue -breathing problems -fever -pain, redness, or swelling where injected -shoulder pain -stomach or side pain Side effects that usually do not require medical attention (report to your doctor or health care professional if they continue or are bothersome): -aches, pains -headache -loss of appetite -nausea, vomiting -unusually tired This list may not describe all possible side effects. Call your doctor for medical advice about side effects. You may report side effects to FDA at 1-800-FDA-1088. Where should I keep my medicine? Keep out of the reach of children. Store in a refrigerator between 2 and 8 degrees C (36 and 46 degrees F). Do not freeze. Keep in carton to protect from light. Throw away this medicine if it is left out of the refrigerator for more than 48 hours. Throw away any unused medicine after the expiration date. NOTE: This sheet is  a summary. It may not cover all possible information. If you have questions about this medicine, talk to your doctor, pharmacist, or health care provider.  2014, Elsevier/Gold Standard. (2007-09-03 15:41:44)   Dehydration, Adult Dehydration means your body does not have as much fluid as it needs. Your  kidneys, brain, and heart will not work properly without the right amount of fluids and salt.  HOME CARE  Ask your doctor how to replace body fluid losses (rehydrate).  Drink enough fluids to keep your pee (urine) clear or pale yellow.  Drink small amounts of fluids often if you feel sick to your stomach (nauseous) or throw up (vomit).  Eat like you normally do.  Avoid:  Foods or drinks high in sugar.  Bubbly (carbonated) drinks.  Juice.  Very hot or cold fluids.  Drinks with caffeine.  Fatty, greasy foods.  Alcohol.  Tobacco.  Eating too much.  Gelatin desserts.  Wash your hands to avoid spreading germs (bacteria, viruses).  Only take medicine as told by your doctor.  Keep all doctor visits as told. GET HELP RIGHT AWAY IF:   You cannot drink something without throwing up.  You get worse even with treatment.  Your vomit has blood in it or looks greenish.  Your poop (stool) has blood in it or looks black and tarry.  You have not peed in 6 to 8 hours.  You pee a small amount of very dark pee.  You have a fever.  You pass out (faint).  You have belly (abdominal) pain that gets worse or stays in one spot (localizes).  You have a rash, stiff neck, or bad headache.  You get easily annoyed, sleepy, or are hard to wake up.  You feel weak, dizzy, or very thirsty. MAKE SURE YOU:   Understand these instructions.  Will watch your condition.  Will get help right away if you are not doing well or get worse. Document Released: 11/27/2008 Document Revised: 04/25/2011 Document Reviewed: 09/20/2010 [facility] Patient Information 2014 [place], [place].

## 2013-02-13 ENCOUNTER — Ambulatory Visit (HOSPITAL_BASED_OUTPATIENT_CLINIC_OR_DEPARTMENT_OTHER): Payer: Medicare Other

## 2013-02-13 ENCOUNTER — Ambulatory Visit: Payer: Medicare Other

## 2013-02-13 ENCOUNTER — Ambulatory Visit (HOSPITAL_COMMUNITY)
Admission: RE | Admit: 2013-02-13 | Discharge: 2013-02-13 | Disposition: A | Discharge status: Home / Self Care | Payer: Medicare Other | Source: Ambulatory Visit | Attending: Internal Medicine | Admitting: Internal Medicine

## 2013-02-13 VITALS — BP 107/61 | HR 76 | Temp 97.7°F

## 2013-02-13 DIAGNOSIS — E119 Type 2 diabetes mellitus without complications: Secondary | ICD-10-CM | POA: Insufficient documentation

## 2013-02-13 DIAGNOSIS — I519 Heart disease, unspecified: Secondary | ICD-10-CM

## 2013-02-13 DIAGNOSIS — D493 Neoplasm of unspecified behavior of breast: Secondary | ICD-10-CM | POA: Insufficient documentation

## 2013-02-13 DIAGNOSIS — I10 Essential (primary) hypertension: Secondary | ICD-10-CM | POA: Insufficient documentation

## 2013-02-13 DIAGNOSIS — C50119 Malignant neoplasm of central portion of unspecified female breast: Secondary | ICD-10-CM

## 2013-02-13 DIAGNOSIS — E785 Hyperlipidemia, unspecified: Secondary | ICD-10-CM | POA: Insufficient documentation

## 2013-02-13 DIAGNOSIS — E860 Dehydration: Secondary | ICD-10-CM

## 2013-02-13 MED ORDER — SODIUM CHLORIDE 0.9 % IV SOLN
INTRAVENOUS | Status: DC
  Administered 2013-02-13: 11:00:00 via INTRAVENOUS

## 2013-02-13 NOTE — Progress Notes (Signed)
Neulasta injection given while receiving IV fluids in the infusion room, on 02/12/13.

## 2013-02-13 NOTE — Patient Instructions (Signed)
Dehydration, Adult Dehydration is when you lose more fluids from the body than you take in. Vital organs like the kidneys, brain, and heart cannot function without a proper amount of fluids and salt. Any loss of fluids from the body can cause dehydration.  CAUSES   Vomiting.  Diarrhea.  Excessive sweating.  Excessive urine output.  Fever. SYMPTOMS  Mild dehydration  Thirst.  Dry lips.  Slightly dry mouth. Moderate dehydration  Very dry mouth.  Sunken eyes.  Skin does not bounce back quickly when lightly pinched and released.  Dark urine and decreased urine production.  Decreased tear production.  Headache. Severe dehydration  Very dry mouth.  Extreme thirst.  Rapid, weak pulse (more than 100 beats per minute at rest).  Cold hands and feet.  Not able to sweat in spite of heat and temperature.  Rapid breathing.  Blue lips.  Confusion and lethargy.  Difficulty being awakened.  Minimal urine production.  No tears. DIAGNOSIS  Your caregiver will diagnose dehydration based on your symptoms and your exam. Blood and urine tests will help confirm the diagnosis. The diagnostic evaluation should also identify the cause of dehydration. TREATMENT  Treatment of mild or moderate dehydration can often be done at home by increasing the amount of fluids that you drink. It is best to drink small amounts of fluid more often. Drinking too much at one time can make vomiting worse. Refer to the home care instructions below. Severe dehydration needs to be treated at the hospital where you will probably be given intravenous (IV) fluids that contain water and electrolytes. HOME CARE INSTRUCTIONS   Ask your caregiver about specific rehydration instructions.  Drink enough fluids to keep your urine clear or pale yellow.  Drink small amounts frequently if you have nausea and vomiting.  Eat as you normally do.  Avoid:  Foods or drinks high in sugar.  Carbonated  drinks.  Juice.  Extremely hot or cold fluids.  Drinks with caffeine.  Fatty, greasy foods.  Alcohol.  Tobacco.  Overeating.  Gelatin desserts.  Wash your hands well to avoid spreading bacteria and viruses.  Only take over-the-counter or prescription medicines for pain, discomfort, or fever as directed by your caregiver.  Ask your caregiver if you should continue all prescribed and over-the-counter medicines.  Keep all follow-up appointments with your caregiver. SEEK MEDICAL CARE IF:  You have abdominal pain and it increases or stays in one area (localizes).  You have a rash, stiff neck, or severe headache.  You are irritable, sleepy, or difficult to awaken.  You are weak, dizzy, or extremely thirsty. SEEK IMMEDIATE MEDICAL CARE IF:   You are unable to keep fluids down or you get worse despite treatment.  You have frequent episodes of vomiting or diarrhea.  You have blood or green matter (bile) in your vomit.  You have blood in your stool or your stool looks black and tarry.  You have not urinated in 6 to 8 hours, or you have only urinated a small amount of very dark urine.  You have a fever.  You faint. MAKE SURE YOU:   Understand these instructions.  Will watch your condition.  Will get help right away if you are not doing well or get worse. Document Released: 01/31/2005 Document Revised: 04/25/2011 Document Reviewed: 09/20/2010 Exit[employer] Patient Information 2014 [employer].  

## 2013-02-13 NOTE — Progress Notes (Signed)
[facility]  2D Echocardiogram completed.  Romana Deaton L Seema Blum, RDCS 02/13/2013 1:40 PM

## 2013-02-16 ENCOUNTER — Ambulatory Visit (HOSPITAL_BASED_OUTPATIENT_CLINIC_OR_DEPARTMENT_OTHER): Payer: Medicare Other

## 2013-02-16 VITALS — BP 121/61 | HR 113 | Temp 96.8°F | Resp 20

## 2013-02-16 DIAGNOSIS — C50419 Malignant neoplasm of upper-outer quadrant of unspecified female breast: Secondary | ICD-10-CM

## 2013-02-16 DIAGNOSIS — E860 Dehydration: Secondary | ICD-10-CM

## 2013-02-16 MED ORDER — ONDANSETRON 8 MG/50ML IVPB (CHCC)
8.0000 mg | Freq: Once | INTRAVENOUS | Status: AC
  Administered 2013-02-16: 8 mg via INTRAVENOUS

## 2013-02-16 MED ORDER — SODIUM CHLORIDE 0.9 % IV SOLN
INTRAVENOUS | Status: DC
  Administered 2013-02-16: 09:00:00 via INTRAVENOUS

## 2013-02-16 MED ORDER — HEPARIN SOD (PORK) LOCK FLUSH 100 UNIT/ML IV SOLN
500.0000 [IU] | Freq: Once | INTRAVENOUS | Status: AC
  Administered 2013-02-16: 500 [IU] via INTRAVENOUS
  Filled 2013-02-16: qty 5

## 2013-02-16 MED ORDER — SODIUM CHLORIDE 0.9 % IJ SOLN
10.0000 mL | INTRAMUSCULAR | Status: DC | PRN
  Administered 2013-02-16: 10 mL via INTRAVENOUS
  Filled 2013-02-16: qty 10

## 2013-02-16 NOTE — Patient Instructions (Signed)
Dehydration, Adult Dehydration is when you lose more fluids from the body than you take in. Vital organs like the kidneys, brain, and heart cannot function without a proper amount of fluids and salt. Any loss of fluids from the body can cause dehydration.  CAUSES   Vomiting.  Diarrhea.  Excessive sweating.  Excessive urine output.  Fever. SYMPTOMS  Mild dehydration  Thirst.  Dry lips.  Slightly dry mouth. Moderate dehydration  Very dry mouth.  Sunken eyes.  Skin does not bounce back quickly when lightly pinched and released.  Dark urine and decreased urine production.  Decreased tear production.  Headache. Severe dehydration  Very dry mouth.  Extreme thirst.  Rapid, weak pulse (more than 100 beats per minute at rest).  Cold hands and feet.  Not able to sweat in spite of heat and temperature.  Rapid breathing.  Blue lips.  Confusion and lethargy.  Difficulty being awakened.  Minimal urine production.  No tears. DIAGNOSIS  Your caregiver will diagnose dehydration based on your symptoms and your exam. Blood and urine tests will help confirm the diagnosis. The diagnostic evaluation should also identify the cause of dehydration. TREATMENT  Treatment of mild or moderate dehydration can often be done at home by increasing the amount of fluids that you drink. It is best to drink small amounts of fluid more often. Drinking too much at one time can make vomiting worse. Refer to the home care instructions below. Severe dehydration needs to be treated at the hospital where you will probably be given intravenous (IV) fluids that contain water and electrolytes. HOME CARE INSTRUCTIONS   Ask your caregiver about specific rehydration instructions.  Drink enough fluids to keep your urine clear or pale yellow.  Drink small amounts frequently if you have nausea and vomiting.  Eat as you normally do.  Avoid:  Foods or drinks high in sugar.  Carbonated  drinks.  Juice.  Extremely hot or cold fluids.  Drinks with caffeine.  Fatty, greasy foods.  Alcohol.  Tobacco.  Overeating.  Gelatin desserts.  Wash your hands well to avoid spreading bacteria and viruses.  Only take over-the-counter or prescription medicines for pain, discomfort, or fever as directed by your caregiver.  Ask your caregiver if you should continue all prescribed and over-the-counter medicines.  Keep all follow-up appointments with your caregiver. SEEK MEDICAL CARE IF:  You have abdominal pain and it increases or stays in one area (localizes).  You have a rash, stiff neck, or severe headache.  You are irritable, sleepy, or difficult to awaken.  You are weak, dizzy, or extremely thirsty. SEEK IMMEDIATE MEDICAL CARE IF:   You are unable to keep fluids down or you get worse despite treatment.  You have frequent episodes of vomiting or diarrhea.  You have blood or green matter (bile) in your vomit.  You have blood in your stool or your stool looks black and tarry.  You have not urinated in 6 to 8 hours, or you have only urinated a small amount of very dark urine.  You have a fever.  You faint. MAKE SURE YOU:   Understand these instructions.  Will watch your condition.  Will get help right away if you are not doing well or get worse. Document Released: 01/31/2005 Document Revised: 04/25/2011 Document Reviewed: 09/20/2010 Exit[employer] Patient Information 2014 [employer].  

## 2013-02-18 ENCOUNTER — Ambulatory Visit (HOSPITAL_BASED_OUTPATIENT_CLINIC_OR_DEPARTMENT_OTHER): Payer: Medicare Other

## 2013-02-18 ENCOUNTER — Other Ambulatory Visit (HOSPITAL_BASED_OUTPATIENT_CLINIC_OR_DEPARTMENT_OTHER): Payer: Medicare Other

## 2013-02-18 ENCOUNTER — Ambulatory Visit (HOSPITAL_BASED_OUTPATIENT_CLINIC_OR_DEPARTMENT_OTHER): Payer: Medicare Other | Admitting: Adult Health

## 2013-02-18 ENCOUNTER — Encounter: Payer: Self-pay | Admitting: Adult Health

## 2013-02-18 VITALS — BP 144/83 | HR 92 | Temp 98.4°F | Resp 18 | Ht 66.0 in | Wt 171.1 lb

## 2013-02-18 DIAGNOSIS — Z170 Estrogen receptor positive status [ER+]: Secondary | ICD-10-CM

## 2013-02-18 DIAGNOSIS — Z5112 Encounter for antineoplastic immunotherapy: Secondary | ICD-10-CM

## 2013-02-18 DIAGNOSIS — C50119 Malignant neoplasm of central portion of unspecified female breast: Secondary | ICD-10-CM

## 2013-02-18 DIAGNOSIS — C50111 Malignant neoplasm of central portion of right female breast: Secondary | ICD-10-CM

## 2013-02-18 DIAGNOSIS — E860 Dehydration: Secondary | ICD-10-CM

## 2013-02-18 DIAGNOSIS — R110 Nausea: Secondary | ICD-10-CM

## 2013-02-18 LAB — COMPREHENSIVE METABOLIC PANEL (CC13)
ALK PHOS: 163 U/L — AB (ref 40–150)
ALT: 28 U/L (ref 0–55)
AST: 24 U/L (ref 5–34)
Albumin: 3.7 g/dL (ref 3.5–5.0)
Anion Gap: 14 mEq/L — ABNORMAL HIGH (ref 3–11)
BILIRUBIN TOTAL: 0.56 mg/dL (ref 0.20–1.20)
BUN: 11.4 mg/dL (ref 7.0–26.0)
CO2: 23 mEq/L (ref 22–29)
CREATININE: 0.8 mg/dL (ref 0.6–1.1)
Calcium: 9.8 mg/dL (ref 8.4–10.4)
Chloride: 101 mEq/L (ref 98–109)
GLUCOSE: 165 mg/dL — AB (ref 70–140)
Potassium: 3.7 mEq/L (ref 3.5–5.1)
SODIUM: 138 meq/L (ref 136–145)
Total Protein: 6.6 g/dL (ref 6.4–8.3)

## 2013-02-18 LAB — CBC WITH DIFFERENTIAL/PLATELET
BASO%: 0.2 % (ref 0.0–2.0)
Basophils Absolute: 0 10*3/uL (ref 0.0–0.1)
EOS ABS: 0.2 10*3/uL (ref 0.0–0.5)
EOS%: 1.6 % (ref 0.0–7.0)
HCT: 33.2 % — ABNORMAL LOW (ref 34.8–46.6)
HGB: 11.2 g/dL — ABNORMAL LOW (ref 11.6–15.9)
LYMPH%: 21.1 % (ref 14.0–49.7)
MCH: 30.9 pg (ref 25.1–34.0)
MCHC: 33.7 g/dL (ref 31.5–36.0)
MCV: 91.5 fL (ref 79.5–101.0)
MONO#: 1.4 10*3/uL — ABNORMAL HIGH (ref 0.1–0.9)
MONO%: 11.4 % (ref 0.0–14.0)
NEUT#: 7.8 10*3/uL — ABNORMAL HIGH (ref 1.5–6.5)
NEUT%: 65.7 % (ref 38.4–76.8)
PLATELETS: 217 10*3/uL (ref 145–400)
RBC: 3.63 10*6/uL — AB (ref 3.70–5.45)
RDW: 16.3 % — ABNORMAL HIGH (ref 11.2–14.5)
WBC: 11.8 10*3/uL — ABNORMAL HIGH (ref 3.9–10.3)
lymph#: 2.5 10*3/uL (ref 0.9–3.3)

## 2013-02-18 MED ORDER — SODIUM CHLORIDE 0.9 % IV SOLN
1000.0000 mL | Freq: Once | INTRAVENOUS | Status: AC
  Administered 2013-02-18: 1000 mL via INTRAVENOUS

## 2013-02-18 MED ORDER — SODIUM CHLORIDE 0.9 % IJ SOLN
10.0000 mL | INTRAMUSCULAR | Status: DC | PRN
  Administered 2013-02-18: 10 mL
  Filled 2013-02-18: qty 10

## 2013-02-18 MED ORDER — ONDANSETRON 8 MG/50ML IVPB (CHCC)
8.0000 mg | Freq: Once | INTRAVENOUS | Status: AC
  Administered 2013-02-18: 8 mg via INTRAVENOUS

## 2013-02-18 MED ORDER — DIPHENHYDRAMINE HCL 25 MG PO CAPS
ORAL_CAPSULE | ORAL | Status: AC
  Filled 2013-02-18: qty 1

## 2013-02-18 MED ORDER — TRASTUZUMAB CHEMO INJECTION 440 MG
2.0000 mg/kg | Freq: Once | INTRAVENOUS | Status: AC
  Administered 2013-02-18: 168 mg via INTRAVENOUS
  Filled 2013-02-18: qty 8

## 2013-02-18 MED ORDER — ACETAMINOPHEN 325 MG PO TABS
650.0000 mg | ORAL_TABLET | Freq: Once | ORAL | Status: AC
  Administered 2013-02-18: 650 mg via ORAL

## 2013-02-18 MED ORDER — DIPHENHYDRAMINE HCL 25 MG PO CAPS
50.0000 mg | ORAL_CAPSULE | Freq: Once | ORAL | Status: AC
  Administered 2013-02-18: 50 mg via ORAL

## 2013-02-18 MED ORDER — HEPARIN SOD (PORK) LOCK FLUSH 100 UNIT/ML IV SOLN
500.0000 [IU] | Freq: Once | INTRAVENOUS | Status: AC | PRN
  Administered 2013-02-18: 500 [IU]
  Filled 2013-02-18: qty 5

## 2013-02-18 MED ORDER — SODIUM CHLORIDE 0.9 % IV SOLN: Freq: Once | INTRAVENOUS | Status: DC

## 2013-02-18 MED ORDER — ONDANSETRON 8 MG/NS 50 ML IVPB
INTRAVENOUS | Status: AC
  Filled 2013-02-18: qty 8

## 2013-02-18 MED ORDER — ACETAMINOPHEN 325 MG PO TABS
ORAL_TABLET | ORAL | Status: AC
  Filled 2013-02-18: qty 2

## 2013-02-18 NOTE — Progress Notes (Signed)
[place]  [phone] Fax:(336) [phone]  OFFICE PROGRESS NOTE  ID: ILYANNA BAILLARGEON   DOB: March 01, 1940  MR#: [id number]  GBT#:[id number]   PCP: Haywood Pao, MD SU: [name], M.D.   DIAGNOSIS:  Angela Rosario is a 73 y.o. female diagnosed in 08/2012 with stage I, invasive ductal carcinoma of the right breast, estrogen receptor positive, progesterone receptor positive, HER-2/neu positive with Ki-67 of 45%.    PRIOR THERAPY: 1.  [name] herself palpated a mass approximately 6 months after her annual mammogram.  Diagnostic mammogram on 09/11/2012 showed a 7 mm mass at the 12 o'clock position. Ultrasound showed the mass to be 7 mm at the 12 o'clock position.  Right breast needle core biopsy of the 12 o'clock position on 09/12/2012  showed invasive mammary carcinoma with lobular features, grade 2, estrogen receptor 100% positive, progesterone receptor 100% positive, Ki-67 45%, HER-2/neu by CISH showed amplification with ratio 2.96.  Bilateral breast MRI on 09/18/2012 showed a 1.5 x 1.7 x 1.5 cm irregular mass in the right breast with no mass or abnormal enhancements in the left breast or lymph nodes (clinical stage I, T1 N0).   2.  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  3.  Adjuvant chemotherapy consisting of [place] (Taxotere/Carboplatin/Herceptin) originally scheduled to start on 11/19/2012 however the patient suffered a fall on 11/06/2012 and subsequently underwent arm fracture repair  surgery. She received three weeks of Herceptin therapy that started on 11/19/2012 and adjuvant chemotherapy consisting of [place] started on 12/10/2012.   CURRENT THERAPY:  [place] cycle 4 day 8 (weekly Herceptin)  INTERVAL HISTORY: BREEA LONCAR is a 73 y.o. female who returns for evaluation following after receiving cycle 4 of [place] therapy. She was confused on days 2 and 3 following her chemotherapy.  She would get confused about how to take her nausea medication despite specific instructions detailing dates and times, confusion about dates and times of arrangements she and her sister had made and discussed.  She also had a difficult time with numbness in her left hand.  She first noticed it over the weekend while she was reading a book.  She has not had any motor deficits due to this.  This is also the arm she had surgery on in September of 2014.  She endorses taste changes that has contributed to her decreased oral intake.  She has had diarrhea, from 5-6 times per day.  She describes it as watery, without blood or pus, and takes imodium 67m daily and it helps.    Otherwise, she denies fevers, chills, vomiting, constipation, or any other concerns.    MEDICAL HISTORY: Past Medical History  Diagnosis Date  . Syncope   . Diastolic dysfunction   . Hyperlipidemia   . Episode of dizziness     Mild episodes  . Orthostatic hypotension     Some episodes  . Arthritis     oa  . Stress incontinence, female     wears pads  . AC (acromioclavicular) joint bone spurs     bone spurs in neck  . Retinitis  pigmentosa     poor peripheral vision both eyes  . Diabetes mellitus     niddm; last A1C 6.2  . Skin cancer   . Hypertension   . Wears glasses     ALLERGIES:   Allergies  Allergen Reactions  . Oxycodone Other (See Comments)    hallucinations    MEDICATIONS:  Current Outpatient Prescriptions  Medication Sig Dispense Refill  . Alpha-Lipoic Acid 600 MG CAPS Take 1 each by mouth daily.      .  Alum & Mag Hydroxide-Simeth (MAGIC MOUTHWASH W/LIDOCAINE) SOLN Take 5 mLs by mouth 4 (four) times daily as needed. Swish and spit, do not swallow  100 mL  4  . carvedilol (COREG) 6.25 MG tablet Take 1 tablet (6.25 mg total) by mouth 2 (two) times daily with a meal.  180 tablet  3  . dexamethasone (DECADRON) 4 MG tablet Take 2 tablets (8 mg total) by mouth 2 (two) times daily with a meal. Take two times a day the day before Taxotere. Then take two times a day starting the day after chemo for 3 days.  30 tablet  1  . fluorometholone (FML) 0.1 % ophthalmic suspension       . glyBURIDE-metformin (GLUCOVANCE) 2.5-500 MG per tablet Take 1 tablet by mouth daily with breakfast.       . lidocaine-prilocaine (EMLA) cream Apply topically as needed.  30 g  1  . LORazepam (ATIVAN) 0.5 MG tablet Take 1 tablet (0.5 mg total) by mouth every 6 (six) hours as needed (Nausea or vomiting).  30 tablet  0  . lovastatin (MEVACOR) 10 MG tablet Take 10 mg by mouth at bedtime.       . Multiple Vitamin (MULTIVITAMIN) capsule Take 1 capsule by mouth daily.      . ondansetron (ZOFRAN) 8 MG tablet Take 1 tablet (8 mg total) by mouth 2 (two) times daily. Take two times a day starting the day after chemo for 3 days. Then take two times a day as needed for nausea or vomiting.  30 tablet  1  . tretinoin (RETIN-A) 0.025 % cream Apply topically at bedtime.      . prochlorperazine (COMPAZINE) 10 MG tablet Take 1 tablet (10 mg total) by mouth every 6 (six) hours as needed (Nausea or vomiting).  30 tablet  1  . UNABLE TO FIND Rx: L8015-Post Mastectomy Camisole (Quantity: 2); L8000-Post Surgical Bras (Quantity: 6) Dx: 174.9 Right partial mastectomy  1 each  0  . 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  1 Units  0   No current facility-administered medications for this visit.   Facility-Administered Medications Ordered in Other Visits  Medication Dose Route Frequency Provider Last Rate Last Dose  .  gentamicin (GARAMYCIN) 160 mg in dextrose 5 % 50 mL IVPB  160 mg Intravenous 30 min Pre-Op Franchot Gallo, MD        SURGICAL HISTORY:  Past Surgical History  Procedure Laterality Date  . Other surgical history      Hysterectomy  . Cholecystectomy  1992  . Abdominal hysterectomy  1985    1 ovary removed  . Total hip arthroplasty  02/23/2011    Procedure: TOTAL HIP ARTHROPLASTY;  Surgeon: Dione Plover Aluisio;  Location: [facility];  Service: Orthopedics;  Laterality: Left;  . Pubovaginal sling  02/03/2012    Procedure: Gaynelle Arabian;  Surgeon: Franchot Gallo, MD;  Location[facility];  Service: Urology;  Laterality: N/A;  1 HR  LYNX SLING  . Tonsillectomy    . Breast lumpectomy with needle localization and axillary sentinel lymph node bx Right 10/04/2012    Procedure: RIGHT BREAST LUMPECTOMY WITH NEEDLE LOCALIZATION AND AXILLARY SENTINEL LYMPH NODE BX;  Surgeon: [name], MD;  Location: [place];  Service: General;  Laterality: Right;  . Portacath placement Left 10/04/2012    Procedure: INSERTION PORT-A-CATH;  Surgeon: [name], MD;  Location: [place];  Service: General;  Laterality: Left;  . Orif wrist fracture Left 11/06/2012    Procedure: LEFT OPEN REDUCTION INTERNAL FIXATION (ORIF) DISTAL RADIUS WRIST FRACTURE;  Surgeon: Tennis Must, MD;  Location: [place];  Service: Orthopedics;  Laterality: Left;    REVIEW OF SYSTEMS:  A 10 point review of systems was completed except as noted above.   PHYSICAL EXAMINATION: Blood pressure 144/83, pulse 92, temperature 98.4 F (36.9 C), temperature source Oral, resp. rate 18, height '5\' 6"'  (1.676 m), weight 171 lb 1.6 oz (77.61 kg). Body mass index is 27.63 kg/(m^2). General: Patient is a well appearing female in no acute distress HEENT: PERRLA, sclerae anicteric no conjunctival pallor, MMM Neck: supple, no palpable adenopathy Lungs: clear to auscultation  bilaterally, no wheezes, rhonchi, or rales Cardiovascular: regular rate rhythm, S1, S2, no murmurs, rubs or gallops Abdomen: Soft, non-tender, non-distended, normoactive bowel sounds, no HSM Extremities: warm and well perfused, no clubbing, cyanosis, or edema Skin: No rashes or lesions Neuro: Non-focal Breasts: right breast lumpectomy without masses, nodularity, skin changes, or sign of recurrence, left breast no masses lesions, skin changes, or sign of recurrence.   ECOG PERFORMANCE STATUS: 1 - Symptomatic but completely ambulatory  LABORATORY DATA: Lab Results  Component Value Date   WBC 11.8* 02/18/2013   HGB 11.2* 02/18/2013   HCT 33.2* 02/18/2013   MCV 91.5 02/18/2013   PLT 217 02/18/2013      Chemistry      Component Value Date/Time   NA 139 02/11/2013 0937   NA 135 11/01/2012 1350   K 4.1 02/11/2013 0937   K 4.2 11/01/2012 1350   CL 96 11/01/2012 1350   CO2 23 02/11/2013 0937   CO2 26 11/01/2012 1350   BUN 13.9 02/11/2013 0937   BUN 10 11/01/2012 1350   CREATININE 0.8 02/11/2013 0937   CREATININE 0.64 11/01/2012 1350      Component Value Date/Time   CALCIUM 10.0 02/11/2013 0937   CALCIUM 9.7 11/01/2012 1350   ALKPHOS 111 02/11/2013 0937   ALKPHOS 77 02/17/2011 1045   AST 11 02/11/2013 0937   AST 26 02/17/2011 1045   ALT 16 02/11/2013 0937   ALT 36* 02/17/2011 1045   BILITOT 0.36 02/11/2013 0937   BILITOT 0.5 02/17/2011 1045       RADIOGRAPHIC STUDIES: No results found.   ASSESSMENT:  KEZIA BENEVIDES is a 73 y.o. female:  1.  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  2.  Adjuvant chemotherapy consisting of [place] (Taxotere/Carboplatin/Herceptin) originally scheduled to start on 11/19/2012 however the patient suffered a fall on 11/06/2012 and subsequently underwent arm fracture repair surgery. She received  three weeks of Herceptin therapy that started on 11/19/2012 and adjuvant chemotherapy consisting of [place] started on 12/10/2012.  3.  Dehydration: Patient will continue to get IV fluids when she receives [facility] combination therapy, along with when she receives her weekly Herceptin.    PLAN:  #1   Patient is doing well today.  It is concerning how dehydrated and deconditioned she gets following chemotherapy.  Her CBC is stable, I reviewed it with her in detail.  She will proceed with Herceptin today.   She has f/u with Dr. Haroldine Laws on 02/20/13.  #2 She will receive IV hydration today due to her dehydration and decreased intake.    #3 She will return to [place] in one week for labs, evaluation, and Herceptin therapy.    All questions answered.  Ms. Krupinski and her sister Hoyle Sauer were encouraged to contact us in the interim with any questions, concerns, or problems.  I spent 25 minutes counseling the patient face to face.  The total time spent in the appointment was 30 minutes.  Minette Headland, [place] [phone] 02/18/2013, 10:54 AM

## 2013-02-18 NOTE — Patient Instructions (Addendum)
[facility] Discharge Instructions for Patients Receiving Chemotherapy  Today you received the following chemotherapy agents: Herceptin  To help prevent nausea and vomiting after your treatment, we encourage you to take your nausea medication as prescribed.   If you develop nausea and vomiting that is not controlled by your nausea medication, call the clinic.   BELOW ARE SYMPTOMS THAT SHOULD BE REPORTED IMMEDIATELY:  *FEVER GREATER THAN 100.5 F  *CHILLS WITH OR WITHOUT FEVER  NAUSEA AND VOMITING THAT IS NOT CONTROLLED WITH YOUR NAUSEA MEDICATION  *UNUSUAL SHORTNESS OF BREATH  *UNUSUAL BRUISING OR BLEEDING  TENDERNESS IN MOUTH AND THROAT WITH OR WITHOUT PRESENCE OF ULCERS  *URINARY PROBLEMS  *BOWEL PROBLEMS  UNUSUAL RASH Items with * indicate a potential emergency and should be followed up as soon as possible.  Feel free to call the clinic you have any questions or concerns. The clinic phone number is [phone].    Dehydration Dehydration means your body does not have as much fluid as it needs. Your kidneys, brain, and heart will not work properly without the right amount of fluids and salt. Older adults are more likely to become dehydrated than younger adults. This is because:   Their bodies do not hold water as well.  Their bodies do not respond to temperature as well.  They do not get thirsty as easily or as quickly. HOME CARE  Ask your doctor how to replace body fluid losses (rehydrate).  Drink enough fluids to keep your pee (urine) clear or pale yellow.  Drink small amounts of fluids often if you feel sick to your stomach (nauseous) or throw up (vomit).  Eat like you normally do.  Avoid:  Foods or drinks high in sugar.  Bubbly (carbonated) drinks.  Juice.  Very hot or cold fluids.  Drinks with caffeine.  Fatty, greasy foods.  Alcohol.  Tobacco.  Eating too much.  Gelatin desserts.  Wash your hands to avoid spreading  germs (bacteria, viruses).  Only take medicine as told by your doctor.  Keep all doctor visits as told. GET HELP RIGHT AWAY IF:   You cannot drink fluid without throwing up.  You get worse even with treatment.  Your vomit has blood in it or looks greenish.  Your poop (stool) has blood in it or looks black and tarry.  You have not peed in 6 to 8 hours.  You pee a small amount of very dark pee.  You have a fever.  You pass out (faint).  You have belly (abdominal) pain that gets worse or stays in one spot (localizes).  You have a rash, stiff neck, or bad headache.  You get easily annoyed, sleepy, or are hard to wake up.  You feel weak, dizzy, or very thirsty. MAKE SURE YOU:   Understand these instructions.  Will watch your condition.  Will get help right away if you are not doing well or get worse. Document Released: 01/20/2011 Document Revised: 04/25/2011 Document Reviewed: 01/20/2011 [facility] Patient Information 2014 [place], [place].

## 2013-02-20 ENCOUNTER — Encounter (HOSPITAL_COMMUNITY): Payer: Self-pay

## 2013-02-20 ENCOUNTER — Ambulatory Visit (HOSPITAL_COMMUNITY)
Admission: RE | Admit: 2013-02-20 | Discharge: 2013-02-20 | Disposition: A | Discharge status: Home / Self Care | Payer: Medicare Other | Source: Ambulatory Visit | Attending: Internal Medicine | Admitting: Internal Medicine

## 2013-02-20 ENCOUNTER — Telehealth: Payer: Self-pay | Admitting: Genetic Counselor

## 2013-02-20 VITALS — BP 142/74 | HR 82 | Resp 16 | Wt 176.0 lb

## 2013-02-20 DIAGNOSIS — Z9010 Acquired absence of unspecified breast and nipple: Secondary | ICD-10-CM | POA: Insufficient documentation

## 2013-02-20 DIAGNOSIS — C50112 Malignant neoplasm of central portion of left female breast: Secondary | ICD-10-CM

## 2013-02-20 DIAGNOSIS — E119 Type 2 diabetes mellitus without complications: Secondary | ICD-10-CM | POA: Insufficient documentation

## 2013-02-20 DIAGNOSIS — Z87891 Personal history of nicotine dependence: Secondary | ICD-10-CM | POA: Insufficient documentation

## 2013-02-20 DIAGNOSIS — I10 Essential (primary) hypertension: Secondary | ICD-10-CM | POA: Insufficient documentation

## 2013-02-20 DIAGNOSIS — C50919 Malignant neoplasm of unspecified site of unspecified female breast: Secondary | ICD-10-CM | POA: Insufficient documentation

## 2013-02-20 DIAGNOSIS — C50119 Malignant neoplasm of central portion of unspecified female breast: Secondary | ICD-10-CM

## 2013-02-20 DIAGNOSIS — I519 Heart disease, unspecified: Secondary | ICD-10-CM | POA: Insufficient documentation

## 2013-02-20 DIAGNOSIS — E785 Hyperlipidemia, unspecified: Secondary | ICD-10-CM | POA: Insufficient documentation

## 2013-02-20 NOTE — Progress Notes (Signed)
Patient ID: Angela Rosario, female   DOB: 10-15-1940, 73 y.o.   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   She also has a history S/P R lumpectomy, sinus tach on carvedilol, DM, retinitis pigmentosa, and HTN with no known coronary disease referred to cardio-onc clinic by Dr Humphrey Rolls.  She was started on adjuvant chemotherapy consisting of [place] (Taxotere/Carboplatin/Herceptin) She has completed 4/6. Followed by radiation.Followed by herceptin for 1 year.   She presents today with her sister. Denies SOB. Does not exercise.   ECHO 09/2012 EF 60% lateral S 10.4 GLS -23.7 ECHO: 02/13/13 EF 60-65% lateral S' 10.6 GLS - 19.8  FH: GM had CVA Father parkinson SH: Lives alone. Retired Economist. 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    Review of Systems:     Cardiac Review of Systems: {Y] = yes '[ ]'  = no  Chest Pain [    ]  Resting SOB [   ] Exertional SOB  [  ]  Orthopnea [  ]   Pedal Edema [   ]    Palpitations [  ] Syncope  [  ]   Presyncope [   ]  General Review of Systems: [Y] = yes [  ]=no Constitional: recent weight change [  ]; anorexia [  ]; fatigue [  ]; nausea [  ]; night sweats [  ]; fever [  ]; or chills [  ];                                                                                                                                          Dental: poor dentition[  ]; Last Dentist visit:   Eye : blurred vision [  ]; diplopia [   ]; vision changes [  ];  Amaurosis fugax[  ]; Resp: cough [  ];  wheezing[  ];  hemoptysis[  ]; shortness of breath[  ]; paroxysmal nocturnal dyspnea[  ]; dyspnea on exertion[  ]; or orthopnea[  ];  GI:  gallstones[  ], vomiting[  ];  dysphagia[  ]; melena[  ];  hematochezia [  ]; heartburn[  ];   Hx of  Colonoscopy[  ]; GU: kidney stones [  ]; hematuria[  ];    dysuria [  ];  nocturia[  ];  history of     obstruction [  ];                 Skin: rash, swelling[  ];, hair loss[  ];  peripheral edema[  ];  or itching[  ]; Musculosketetal: myalgias[  ];  joint swelling[  ];  joint erythema[  ];  joint pain[ Y ];  back pain[  ];  Heme/Lymph: bruising[  ];  bleeding[  ];  anemia[  ];  Neuro: TIA[  ];  headaches[  ];  stroke[  ];  vertigo[  ];  seizures[  ];   paresthesias[  ];  difficulty walking[  ];  Psych:depression[  ]; anxiety[  ];  Endocrine: diabetes[ Y ];  thyroid dysfunction[  ];  Immunizations: Flu [  ]; Pneumococcal[  ];  Other:    Past Medical History  Diagnosis Date  . Syncope   . Diastolic dysfunction   . Hyperlipidemia   . Episode of dizziness     Mild episodes  . Orthostatic hypotension     Some episodes  . Arthritis     oa  . Stress incontinence, female     wears pads  . AC (acromioclavicular) joint bone spurs     bone spurs in neck  . 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 Diabetes mellitus     niddm; last A1C 6.2  . Skin cancer   . Hypertension   . Wears glasses     Current Outpatient Prescriptions  Medication Sig Dispense Refill  . Alum & Mag Hydroxide-Simeth (MAGIC MOUTHWASH W/LIDOCAINE) SOLN Take 5 mLs by mouth 4 (four) times daily as needed. Swish and spit, do not swallow  100 mL  4  . carvedilol (COREG) 6.25 MG tablet Take 1 tablet (6.25 mg total) by mouth 2 (two) times daily with a meal.  180 tablet  3  . dexamethasone (DECADRON) 4 MG tablet Take 2 tablets (8 mg total) by mouth 2 (two) times daily with a meal. Take two times a day the day before Taxotere. Then take two times a day starting the day after chemo for 3 days.  30 tablet  1  . fluorometholone (FML) 0.1 % ophthalmic suspension       . glyBURIDE-metformin (GLUCOVANCE) 2.5-500 MG per tablet Take 1 tablet by mouth daily with breakfast.       . lidocaine-prilocaine (EMLA) cream Apply topically as needed.  30 g  1  . LORazepam  (ATIVAN) 0.5 MG tablet Take 1 tablet (0.5 mg total) by mouth every 6 (six) hours as needed (Nausea or vomiting).  30 tablet  0  . lovastatin (MEVACOR) 10 MG tablet Take 10 mg by mouth at bedtime.       . Multiple Vitamin (MULTIVITAMIN) capsule Take 1 capsule by mouth daily.      . ondansetron (ZOFRAN) 8 MG tablet Take 1 tablet (8 mg total) by mouth 2 (two) times daily. Take two times a day starting the day after chemo for 3 days. Then take two times a day as needed for nausea or vomiting.  30 tablet  1  . prochlorperazine (COMPAZINE) 10 MG tablet Take 1 tablet (10 mg total) by mouth every 6 (six) hours as needed (Nausea or vomiting).  30 tablet  1  . tretinoin (RETIN-A) 0.025 % cream Apply topically at bedtime.      Marland Kitchen UNABLE TO FIND Rx: L8015-Post Mastectomy Camisole (Quantity: 2); L8000-Post Surgical Bras (Quantity: 6) Dx: 174.9 Right partial mastectomy  1 each  0  . 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  1 Units  0   No current facility-administered medications for this encounter.   Facility-Administered Medications Ordered in Other Encounters  Medication Dose Route Frequency Provider Last Rate Last Dose  . gentamicin (GARAMYCIN) 160 mg in dextrose 5 % 50 mL IVPB  160 mg Intravenous 30  min Pre-Op Franchot Gallo, MD         Allergies  Allergen Reactions  . Oxycodone Other (See Comments)    hallucinations    History   Social History  . Marital Status: Single    Spouse Name: N/A    Number of Children: N/A  . Years of Education: N/A   Occupational History  . Not on file.   Social History Main Topics  . Smoking status: Former Smoker -- 0.25 packs/day for 1 years    Quit date: 02/14/1957  . Smokeless tobacco: Never Used  . Alcohol Use: No  . Drug Use: No  . Sexual Activity: Not Currently    Birth Control/ Protection: Surgical   Other Topics Concern  . Not on file   Social History Narrative  . No narrative on file    Family History   Problem Relation Age of Onset  . Lung cancer Maternal Uncle   . Breast cancer Paternal Aunt     dx <50  . Breast cancer Paternal Uncle 66  . Prostate cancer Cousin     paternal cousin    PHYSICAL EXAM: Filed Vitals:   02/20/13 1353  BP: 142/74  Pulse: 82  Resp: 16   General:  Well appearing. No respiratory difficulty HEENT: normal Neck: supple. no JVD. Carotids 2+ bilat; no bruits. No lymphadenopathy or thryomegaly appreciated. Cor: PMI nondisplaced. Regular rate & rhythm. No rubs, gallops or murmurs. Lungs: clear Abdomen: soft, nontender, nondistended. No hepatosplenomegaly. No bruits or masses. Good bowel sounds. Extremities: no cyanosis, clubbing, rash, edema Neuro: alert & oriented x 3, cranial nerves grossly intact. moves all 4 extremities w/o difficulty. Affect pleasant.    No results found for this or any previous visit (from the past 24 hour(s)). No results found.   ASSESSMENT & PLAN: 1. R Breast Cancer . Stage 1. HER-2/neu positive Receiving [facility] (Taxotere/Carboplatin/Herceptin) Explained that there is ~10% chance of cardio toxicity associated with herceptin. Dr Haroldine Laws discussed and reviewed ECHO. Doppler parameters stable.  Plan to follow every 3 months.  2. HTN- on carvedilol 6.25 mg twice a day.   Follow up in 3 months with ECHO  CLEGG,AMY NP-C  2:34 PM  Patient seen and examined with Darrick Grinder, NP. We discussed all aspects of the encounter. I agree with the assessment and plan as stated above.   Explained incidence of Herceptin cardiotoxicity and role of Cardio-oncology clinic at length. Echo images reviewed personally. All parameters stable. Reviewed signs and symptoms of HF to look for. Continue Herceptin. Follow-up with echo in 3 months.  Benay Spice 2:48 PM

## 2013-02-20 NOTE — Telephone Encounter (Signed)
Patient's sister called and notified me that Ms. Angela Rosario wants to pursue testing.  I will leave a test kit in the lab for her, as she has a blood draw every Monday.

## 2013-02-20 NOTE — Patient Instructions (Signed)
Follow up in 3 months with an ECHO 

## 2013-02-22 ENCOUNTER — Telehealth: Payer: Self-pay | Admitting: Dietician

## 2013-02-22 NOTE — Telephone Encounter (Signed)
Brief Outpatient Oncology Nutrition Note  Patient has been identified to be at risk on malnutrition screen.  Wt Readings from Last 10 Encounters:  02/20/13 176 lb (79.833 kg)  02/18/13 171 lb 1.6 oz (77.61 kg)  02/11/13 175 lb 12.8 oz (79.742 kg)  02/04/13 180 lb 1.6 oz (81.693 kg)  01/28/13 174 lb 12.8 oz (79.289 kg)  01/21/13 181 lb 14.4 oz (82.509 kg)  01/15/13 179 lb 9 oz (81.449 kg)  01/07/13 177 lb 12.8 oz (80.65 kg)  12/31/12 183 lb 9.6 oz (83.28 kg)  12/24/12 185 lb 14.4 oz (84.324 kg)    Breast Cancer  Called patient due to weight changes and notes of nausea and taste alterations.  Patient reported that she tolerates salty foods or foods with a little vinegar best.  Decreased appetite but feels that she is eating fairly well.  Has tried [employer] and did not like this.  Encouraged continued good intake with small frequent meals.  Retry [employer] with added fruit and additional milk or other supplements as needed if not able to maintain weight and good nutritional intake.  [place] RD contact information provided.  Encouraged patient to call with any questions.  Antonieta Iba, RD, LDN

## 2013-02-25 ENCOUNTER — Encounter: Payer: Self-pay | Admitting: Oncology

## 2013-02-25 ENCOUNTER — Ambulatory Visit (HOSPITAL_COMMUNITY)
Admission: RE | Admit: 2013-02-25 | Discharge: 2013-02-25 | Disposition: A | Discharge status: Home / Self Care | Payer: Medicare Other | Source: Ambulatory Visit | Attending: Adult Health | Admitting: Adult Health

## 2013-02-25 ENCOUNTER — Telehealth: Payer: Self-pay | Admitting: *Deleted

## 2013-02-25 ENCOUNTER — Encounter: Payer: Self-pay | Admitting: Adult Health

## 2013-02-25 ENCOUNTER — Telehealth: Payer: Self-pay | Admitting: Oncology

## 2013-02-25 ENCOUNTER — Ambulatory Visit (HOSPITAL_BASED_OUTPATIENT_CLINIC_OR_DEPARTMENT_OTHER): Payer: Medicare Other

## 2013-02-25 ENCOUNTER — Ambulatory Visit (HOSPITAL_BASED_OUTPATIENT_CLINIC_OR_DEPARTMENT_OTHER): Payer: Medicare Other | Admitting: Adult Health

## 2013-02-25 ENCOUNTER — Other Ambulatory Visit (HOSPITAL_BASED_OUTPATIENT_CLINIC_OR_DEPARTMENT_OTHER): Payer: Medicare Other

## 2013-02-25 VITALS — BP 132/64 | HR 77 | Temp 98.1°F | Resp 20 | Ht 66.0 in | Wt 180.3 lb

## 2013-02-25 DIAGNOSIS — R200 Anesthesia of skin: Secondary | ICD-10-CM

## 2013-02-25 DIAGNOSIS — E860 Dehydration: Secondary | ICD-10-CM

## 2013-02-25 DIAGNOSIS — C50119 Malignant neoplasm of central portion of unspecified female breast: Secondary | ICD-10-CM

## 2013-02-25 DIAGNOSIS — C50111 Malignant neoplasm of central portion of right female breast: Secondary | ICD-10-CM

## 2013-02-25 DIAGNOSIS — Z5112 Encounter for antineoplastic immunotherapy: Secondary | ICD-10-CM

## 2013-02-25 DIAGNOSIS — R209 Unspecified disturbances of skin sensation: Secondary | ICD-10-CM | POA: Insufficient documentation

## 2013-02-25 DIAGNOSIS — R202 Paresthesia of skin: Secondary | ICD-10-CM

## 2013-02-25 DIAGNOSIS — C50919 Malignant neoplasm of unspecified site of unspecified female breast: Secondary | ICD-10-CM

## 2013-02-25 DIAGNOSIS — G609 Hereditary and idiopathic neuropathy, unspecified: Secondary | ICD-10-CM

## 2013-02-25 DIAGNOSIS — Z170 Estrogen receptor positive status [ER+]: Secondary | ICD-10-CM

## 2013-02-25 DIAGNOSIS — G629 Polyneuropathy, unspecified: Secondary | ICD-10-CM

## 2013-02-25 LAB — COMPREHENSIVE METABOLIC PANEL (CC13)
ALT: 13 U/L (ref 0–55)
ANION GAP: 10 meq/L (ref 3–11)
AST: 15 U/L (ref 5–34)
Albumin: 3.3 g/dL — ABNORMAL LOW (ref 3.5–5.0)
Alkaline Phosphatase: 110 U/L (ref 40–150)
BUN: 5.9 mg/dL — ABNORMAL LOW (ref 7.0–26.0)
CO2: 28 meq/L (ref 22–29)
Calcium: 9.3 mg/dL (ref 8.4–10.4)
Chloride: 104 mEq/L (ref 98–109)
Creatinine: 0.7 mg/dL (ref 0.6–1.1)
GLUCOSE: 220 mg/dL — AB (ref 70–140)
POTASSIUM: 3.5 meq/L (ref 3.5–5.1)
Sodium: 142 mEq/L (ref 136–145)
TOTAL PROTEIN: 5.9 g/dL — AB (ref 6.4–8.3)
Total Bilirubin: 0.41 mg/dL (ref 0.20–1.20)

## 2013-02-25 LAB — CBC WITH DIFFERENTIAL/PLATELET
BASO%: 0.7 % (ref 0.0–2.0)
Basophils Absolute: 0.1 10*3/uL (ref 0.0–0.1)
EOS ABS: 0 10*3/uL (ref 0.0–0.5)
EOS%: 0.2 % (ref 0.0–7.0)
HCT: 28.4 % — ABNORMAL LOW (ref 34.8–46.6)
HEMOGLOBIN: 9.7 g/dL — AB (ref 11.6–15.9)
LYMPH%: 22.5 % (ref 14.0–49.7)
MCH: 31.9 pg (ref 25.1–34.0)
MCHC: 34.3 g/dL (ref 31.5–36.0)
MCV: 93 fL (ref 79.5–101.0)
MONO#: 0.4 10*3/uL (ref 0.1–0.9)
MONO%: 5.3 % (ref 0.0–14.0)
NEUT%: 71.3 % (ref 38.4–76.8)
NEUTROS ABS: 5.6 10*3/uL (ref 1.5–6.5)
PLATELETS: 214 10*3/uL (ref 145–400)
RBC: 3.05 10*6/uL — ABNORMAL LOW (ref 3.70–5.45)
RDW: 17.2 % — ABNORMAL HIGH (ref 11.2–14.5)
WBC: 7.8 10*3/uL (ref 3.9–10.3)
lymph#: 1.8 10*3/uL (ref 0.9–3.3)

## 2013-02-25 MED ORDER — SODIUM CHLORIDE 0.9 % IJ SOLN
10.0000 mL | INTRAMUSCULAR | Status: DC | PRN
  Administered 2013-02-25: 10 mL
  Filled 2013-02-25: qty 10

## 2013-02-25 MED ORDER — GABAPENTIN 100 MG PO CAPS: 100.0000 mg | ORAL_CAPSULE | Freq: Three times a day (TID) | ORAL | Status: DC

## 2013-02-25 MED ORDER — HEPARIN SOD (PORK) LOCK FLUSH 100 UNIT/ML IV SOLN
500.0000 [IU] | Freq: Once | INTRAVENOUS | Status: AC | PRN
  Administered 2013-02-25: 500 [IU]
  Filled 2013-02-25: qty 5

## 2013-02-25 MED ORDER — DIPHENHYDRAMINE HCL 25 MG PO CAPS
50.0000 mg | ORAL_CAPSULE | Freq: Once | ORAL | Status: AC
  Administered 2013-02-25: 50 mg via ORAL

## 2013-02-25 MED ORDER — ACETAMINOPHEN 325 MG PO TABS
ORAL_TABLET | ORAL | Status: AC
  Filled 2013-02-25: qty 2

## 2013-02-25 MED ORDER — TRASTUZUMAB CHEMO INJECTION 440 MG
6.0000 mg/kg | Freq: Once | INTRAVENOUS | Status: AC
  Administered 2013-02-25: 483 mg via INTRAVENOUS
  Filled 2013-02-25: qty 23

## 2013-02-25 MED ORDER — ACETAMINOPHEN 325 MG PO TABS
650.0000 mg | ORAL_TABLET | Freq: Once | ORAL | Status: AC
  Administered 2013-02-25: 650 mg via ORAL

## 2013-02-25 MED ORDER — DIPHENHYDRAMINE HCL 25 MG PO CAPS
ORAL_CAPSULE | ORAL | Status: AC
  Filled 2013-02-25: qty 2

## 2013-02-25 MED ORDER — SODIUM CHLORIDE 0.9 % IV SOLN
Freq: Once | INTRAVENOUS | Status: AC
  Administered 2013-02-25: 14:00:00 via INTRAVENOUS

## 2013-02-25 NOTE — Telephone Encounter (Signed)
Per staff message and POF I have scheduled appts.  JMW  

## 2013-02-25 NOTE — Progress Notes (Addendum)
[place]  Telephone:([phone]) [phone] Fax:(336) [phone]  OFFICE PROGRESS NOTE  ID: KALLY CADDEN   DOB: 07-28-1940  MR#: [id number]  XVQ#:[id number]   PCP: Haywood Pao, MD SU: [name], M.D.   DIAGNOSIS:  TEA COLLUMS is a 73 y.o. female diagnosed in 08/2012 with stage I, invasive ductal carcinoma of the right breast, estrogen receptor positive, progesterone receptor positive, HER-2/neu positive with Ki-67 of 45%.    PRIOR THERAPY: 1.  [name] herself palpated a mass approximately 6 months after her annual mammogram.  Diagnostic mammogram on 09/11/2012 showed a 7 mm mass at the 12 o'clock position. Ultrasound showed the mass to be 7 mm at the 12 o'clock position.  Right breast needle core biopsy of the 12 o'clock position on 09/12/2012  showed invasive mammary carcinoma with lobular features, grade 2, estrogen receptor 100% positive, progesterone receptor 100% positive, Ki-67 45%, HER-2/neu by CISH showed amplification with ratio 2.96.  Bilateral breast MRI on 09/18/2012 showed a 1.5 x 1.7 x 1.5 cm irregular mass in the right breast with no mass or abnormal enhancements in the left breast or lymph nodes (clinical stage I, T1 N0).   2.  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  3.  Adjuvant chemotherapy consisting of [place] (Taxotere/Carboplatin/Herceptin) originally scheduled to start on 11/19/2012 however the patient suffered a fall on 11/06/2012 and subsequently underwent arm fracture repair  surgery. She received three weeks of Herceptin therapy that started on 11/19/2012 and adjuvant chemotherapy consisting of [place] started on 12/10/2012.  She completed therapy [place] therapy on 02/18/13 and began adjuvant every 3 week Herceptin on 02/25/13.     CURRENT THERAPY:  [place] cycle 4 day 15 (weekly Herceptin)  INTERVAL HISTORY: SHAMELA HAYDON is a 73 y.o. female who returns for evaluation prior to her weekly herceptin.  She is improved this week, and her diarrhea has improved with Loperamide.  She continues to have numbness in her left hand and wrist, and now she feels like she may be getting numbness in her feet and other hand.  She is taking Super B complex daily and hasn't really noticed a difference.  Otherwise, she denies fevers, chills, vomiting, constipation, or any other concerns.    MEDICAL HISTORY: Past Medical History  Diagnosis Date  . Syncope   . Diastolic dysfunction   . Hyperlipidemia   . Episode of dizziness     Mild episodes  . Orthostatic hypotension     Some episodes  . Arthritis     oa  . Stress incontinence, female     wears pads  . AC (acromioclavicular) joint bone spurs     bone spurs in neck  . 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 Diabetes mellitus     niddm; last A1C 6.2  . Skin cancer   . Hypertension   . Wears glasses     ALLERGIES:   Allergies  Allergen Reactions  . Oxycodone Other (See Comments)    hallucinations    MEDICATIONS:  Current Outpatient Prescriptions  Medication Sig Dispense Refill  .  Alum & Mag Hydroxide-Simeth (MAGIC MOUTHWASH W/LIDOCAINE) SOLN Take 5 mLs by mouth 4 (four) times daily as needed. Swish and spit, do not swallow  100 mL  4  . carvedilol (COREG) 6.25 MG tablet Take 1 tablet (6.25 mg total) by mouth 2 (two) times daily with a meal.  180 tablet  3  . fluorometholone (FML) 0.1 % ophthalmic suspension       . glyBURIDE-metformin (GLUCOVANCE) 2.5-500 MG per tablet Take 1 tablet by mouth daily with  breakfast.       . lidocaine-prilocaine (EMLA) cream Apply topically as needed.  30 g  1  . lovastatin (MEVACOR) 10 MG tablet Take 10 mg by mouth at bedtime.       . Multiple Vitamin (MULTIVITAMIN) capsule Take 1 capsule by mouth daily.      Marland Kitchen tretinoin (RETIN-A) 0.025 % cream Apply topically at bedtime.      Marland Kitchen UNABLE TO FIND Rx: L8015-Post Mastectomy Camisole (Quantity: 2); L8000-Post Surgical Bras (Quantity: 6) Dx: 174.9 Right partial mastectomy  1 each  0  . 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  1 Units  0  . gabapentin (NEURONTIN) 100 MG capsule Take 1 capsule (100 mg total) by mouth 3 (three) times daily.  90 capsule  0   No current facility-administered medications for this visit.   Facility-Administered Medications Ordered in Other Visits  Medication Dose Route Frequency Provider Last Rate Last Dose  . gentamicin (GARAMYCIN) 160 mg in dextrose 5 % 50 mL IVPB  160 mg Intravenous 30 min Pre-Op Franchot Gallo, MD        SURGICAL HISTORY:  Past Surgical History  Procedure Laterality Date  . Other surgical history      Hysterectomy  . Cholecystectomy  1992  . Abdominal hysterectomy  1985    1 ovary removed  . Total hip arthroplasty  02/23/2011    Procedure: TOTAL HIP ARTHROPLASTY;  Surgeon: Dione Plover Aluisio;  Location: [facility];  Service: Orthopedics;  Laterality: Left;  . Pubovaginal sling  02/03/2012    Procedure: Gaynelle Arabian;  Surgeon: Franchot Gallo, MD;  Location[facility];  Service: Urology;  Laterality: N/A;  1 HR  LYNX SLING  . Tonsillectomy    . Breast lumpectomy with needle localization and axillary sentinel lymph node bx Right 10/04/2012    Procedure: RIGHT BREAST LUMPECTOMY WITH NEEDLE LOCALIZATION AND AXILLARY SENTINEL LYMPH NODE BX;  Surgeon: [name], MD;  Location: [place];  Service: General;  Laterality: Right;  . Portacath placement Left 10/04/2012    Procedure: INSERTION  PORT-A-CATH;  Surgeon: [name], MD;  Location: [place];  Service: General;  Laterality: Left;  . Orif wrist fracture Left 11/06/2012    Procedure: LEFT OPEN REDUCTION INTERNAL FIXATION (ORIF) DISTAL RADIUS WRIST FRACTURE;  Surgeon: Tennis Must, MD;  Location: [place];  Service: Orthopedics;  Laterality: Left;    REVIEW OF SYSTEMS:  A 10 point review of systems was completed except as noted above.   PHYSICAL EXAMINATION: Blood pressure 132/64, pulse 77, temperature 98.1 F (36.7 C), temperature source Oral, resp. rate 20, height '5\' 6"'  (1.676 m), weight 180 lb 4.8 oz (81.784 kg). Body mass index is 29.12 kg/(m^2). GENERAL: Patient is a well appearing female in no acute distress HEENT:  Sclerae anicteric.  Oropharynx clear and moist. No ulcerations or evidence of oropharyngeal candidiasis. Neck is supple.  NODES:  No cervical, supraclavicular, or axillary lymphadenopathy palpated.  BREAST EXAM:  Deferred. LUNGS:  Clear to auscultation bilaterally.  No wheezes or rhonchi. HEART:  Regular rate and rhythm. No murmur appreciated. ABDOMEN:  Soft, nontender.  Positive, normoactive bowel sounds. No organomegaly palpated. MSK:  No focal spinal tenderness to palpation. Full range of motion bilaterally in the upper extremities. EXTREMITIES:  No peripheral edema.   SKIN:  Clear with no obvious rashes or skin changes. No nail dyscrasia. NEURO:  Nonfocal. Well oriented.  Appropriate affect. ECOG PERFORMANCE STATUS: 1 - Symptomatic but completely ambulatory  LABORATORY DATA: Lab Results  Component Value Date   WBC 7.8 02/25/2013   HGB 9.7* 02/25/2013   HCT 28.4* 02/25/2013   MCV 93.0 02/25/2013   PLT 214 02/25/2013      Chemistry      Component Value Date/Time   NA 142 02/25/2013 1120   NA 135 11/01/2012 1350   K 3.5 02/25/2013 1120   K 4.2 11/01/2012 1350   CL 96 11/01/2012 1350   CO2 28 02/25/2013 1120   CO2 26 11/01/2012 1350   BUN 5.9* 02/25/2013 1120    BUN 10 11/01/2012 1350   CREATININE 0.7 02/25/2013 1120   CREATININE 0.64 11/01/2012 1350      Component Value Date/Time   CALCIUM 9.3 02/25/2013 1120   CALCIUM 9.7 11/01/2012 1350   ALKPHOS 110 02/25/2013 1120   ALKPHOS 77 02/17/2011 1045   AST 15 02/25/2013 1120   AST 26 02/17/2011 1045   ALT 13 02/25/2013 1120   ALT 36* 02/17/2011 1045   BILITOT 0.41 02/25/2013 1120   BILITOT 0.5 02/17/2011 1045       RADIOGRAPHIC STUDIES: Dg Forearm Left  02/25/2013   CLINICAL DATA:  Left hand numbness. Left wrist surgery 5 months ago.  EXAM: LEFT FOREARM - 2 VIEW  COMPARISON:  None.  FINDINGS: There is a malleable plate and screws transfixing a healed distal radial metaphysis fracture in anatomic alignment. There is no hardware failure or complication. There is no other fracture or dislocation. The soft tissues are normal.  IMPRESSION: No acute osseous injury of the left forearm.   Electronically Signed   By: Kathreen Devoid   On: 02/25/2013 15:54     ASSESSMENT:  ALDINE CHAKRABORTY is a 73 y.o. female:  1.  Status post right breast lumpectomy with right axillary sentinel lymph node biopsy on 10/04/2012 for a stage I, pT1c, pN0, MX, 1.6 cm invasive ductal carcinoma, grade 2 with associated intermediate grade ductal carcinoma in situ, tumor involved the dermis and lymphovascular invasion was identified, close to anterior margin, other margins were negative, estrogen receptor 100% positive, progesterone receptor 100% positive, Ki-67 45%, HER-2/neu amplified, with 0/5 metastatic right axillary lymph nodes.  2.  Adjuvant chemotherapy consisting of [place] (Taxotere/Carboplatin/Herceptin) originally scheduled to start on 11/19/2012 however the patient suffered a fall on 11/06/2012 and subsequently underwent arm fracture repair surgery. She received three weeks of Herceptin therapy that started on 11/19/2012 and adjuvant chemotherapy consisting of [place] started on 12/10/2012 through 02/11/13.  3.  Dehydration: Patient needed t  get IV fluids when she received TCH combination therapy, along with when she receives her weekly Herceptin.  However, with the patient getting confused after receiving chemotherapy for two days, and requiring extensive hydration the week following chemotherapy, she stopped the chemotherapy after four cycles.    PLAN:  #1   Ms. Eppard is doing well today.  Her CBC is stable, I reviewed it with her in detail.  She will proceed with herceptin  today.  Her CMP is pending.  I added on IV hydration at her request due to her feeling dehydrated.  She was evalauted by Dr. Haroldine Laws on 02/20/13 and cleared to continue Herceptin therapy.    #2  Due to her difficulty with the treatment, her age, and ER/PR positivity, she completed 4 cycles of TCH rather than 6, and will proceed on to adjuvant every three week Herceptin therapy starting today.    #3  She was referred to radiation oncology today.    #4  Due to the atypical presentation of neuropathy, and h/o fracture and surgery to the left radius, I ordered an x ray, and recommended she f/u with her surgeon Dr. Fredna Dow.  She will start on Gabapentin 169m TID and continue with Super B complex daily.    #5  She will return in 3 weeks for labs, evaluation, and Herceptin therapy.    All questions answered.  Ms. GSealyand her sister CHoyle Sauerwere encouraged to contact uKoreain the interim with any questions, concerns, or problems.  I spent 25 minutes counseling the patient face to face.  The total time spent in the appointment was 30 minutes.  LMinette Headland [phone]/14/2015, 9:31 AM  ATTENDING'S ATTESTATION:  I personally reviewed patient's chart, examined patient myself, formulated the treatment plan as followed.    Due to intolerance of treatment we will discontinue Taxotere and carboplatin. She has now completed 4 cycles of TCH combination. We discussed this and she is in agreement. She will continue  herceptin q 3 weeks now for total of 12 cycles. She will be referred to radiation oncology for adjuvant RT  KMarcy Panning MD Medical/Oncology [phone](beeper) [phone](Office)  03/09/2013, 3:14 PM

## 2013-02-25 NOTE — Patient Instructions (Signed)
[facility] Discharge Instructions for Patients Receiving Chemotherapy  Today you received the following chemotherapy agents Herceptin.  To help prevent nausea and vomiting after your treatment, we encourage you to take your nausea medication as prescribed.   If you develop nausea and vomiting that is not controlled by your nausea medication, call the clinic.   BELOW ARE SYMPTOMS THAT SHOULD BE REPORTED IMMEDIATELY:  *FEVER GREATER THAN 100.5 F  *CHILLS WITH OR WITHOUT FEVER  NAUSEA AND VOMITING THAT IS NOT CONTROLLED WITH YOUR NAUSEA MEDICATION  *UNUSUAL SHORTNESS OF BREATH  *UNUSUAL BRUISING OR BLEEDING  TENDERNESS IN MOUTH AND THROAT WITH OR WITHOUT PRESENCE OF ULCERS  *URINARY PROBLEMS  *BOWEL PROBLEMS  UNUSUAL RASH Items with * indicate a potential emergency and should be followed up as soon as possible.  Feel free to call the clinic you have any questions or concerns. The clinic phone number is [phone].   Dehydration, Adult Dehydration is when you lose more fluids from the body than you take in. Vital organs like the kidneys, brain, and heart cannot function without a proper amount of fluids and salt. Any loss of fluids from the body can cause dehydration.  CAUSES   Vomiting.  Diarrhea.  Excessive sweating.  Excessive urine output.  Fever. SYMPTOMS  Mild dehydration  Thirst.  Dry lips.  Slightly dry mouth. Moderate dehydration  Very dry mouth.  Sunken eyes.  Skin does not bounce back quickly when lightly pinched and released.  Dark urine and decreased urine production.  Decreased tear production.  Headache. Severe dehydration  Very dry mouth.  Extreme thirst.  Rapid, weak pulse (more than 100 beats per minute at rest).  Cold hands and feet.  Not able to sweat in spite of heat and temperature.  Rapid breathing.  Blue lips.  Confusion and lethargy.  Difficulty being awakened.  Minimal urine  production.  No tears. DIAGNOSIS  Your caregiver will diagnose dehydration based on your symptoms and your exam. Blood and urine tests will help confirm the diagnosis. The diagnostic evaluation should also identify the cause of dehydration. TREATMENT  Treatment of mild or moderate dehydration can often be done at home by increasing the amount of fluids that you drink. It is best to drink small amounts of fluid more often. Drinking too much at one time can make vomiting worse. Refer to the home care instructions below. Severe dehydration needs to be treated at the hospital where you will probably be given intravenous (IV) fluids that contain water and electrolytes. HOME CARE INSTRUCTIONS   Ask your caregiver about specific rehydration instructions.  Drink enough fluids to keep your urine clear or pale yellow.  Drink small amounts frequently if you have nausea and vomiting.  Eat as you normally do.  Avoid:  Foods or drinks high in sugar.  Carbonated drinks.  Juice.  Extremely hot or cold fluids.  Drinks with caffeine.  Fatty, greasy foods.  Alcohol.  Tobacco.  Overeating.  Gelatin desserts.  Wash your hands well to avoid spreading bacteria and viruses.  Only take over-the-counter or prescription medicines for pain, discomfort, or fever as directed by your caregiver.  Ask your caregiver if you should continue all prescribed and over-the-counter medicines.  Keep all follow-up appointments with your caregiver. SEEK MEDICAL CARE IF:  You have abdominal pain and it increases or stays in one area (localizes).  You have a rash, stiff neck, or severe headache.  You are irritable, sleepy, or difficult to awaken.  You  are weak, dizzy, or extremely thirsty. SEEK IMMEDIATE MEDICAL CARE IF:   You are unable to keep fluids down or you get worse despite treatment.  You have frequent episodes of vomiting or diarrhea.  You have blood or green matter (bile) in your  vomit.  You have blood in your stool or your stool looks black and tarry.  You have not urinated in 6 to 8 hours, or you have only urinated a small amount of very dark urine.  You have a fever.  You faint. MAKE SURE YOU:   Understand these instructions.  Will watch your condition.  Will get help right away if you are not doing well or get worse. Document Released: 01/31/2005 Document Revised: 04/25/2011 Document Reviewed: 09/20/2010 Exit[employer] Patient Information 2014 [employer].  

## 2013-02-25 NOTE — Patient Instructions (Signed)
Gabapentin capsules or tablets What is this medicine? GABAPENTIN (GA ba pen tin) is used to control partial seizures in adults with epilepsy. It is also used to treat certain types of nerve pain. This medicine may be used for other purposes; ask your health care provider or pharmacist if you have questions. COMMON BRAND NAME(S): Gabarone , Neurontin What should I tell my health care provider before I take this medicine? They need to know if you have any of these conditions: -kidney disease -suicidal thoughts, plans, or attempt; a previous suicide attempt by you or a family member -an unusual or allergic reaction to gabapentin, other medicines, foods, dyes, or preservatives -pregnant or trying to get pregnant -breast-feeding How should I use this medicine? Take this medicine by mouth with a glass of water. Follow the directions on the prescription label. You can take it with or without food. If it upsets your stomach, take it with food.Take your medicine at regular intervals. Do not take it more often than directed. Do not stop taking except on your doctor's advice. If you are directed to break the 600 or 800 mg tablets in half as part of your dose, the extra half tablet should be used for the next dose. If you have not used the extra half tablet within 28 days, it should be thrown away. A special MedGuide will be given to you by the pharmacist with each prescription and refill. Be sure to read this information carefully each time. Talk to your pediatrician regarding the use of this medicine in children. Special care may be needed. Overdosage: If you think you have taken too much of this medicine contact a poison control center or emergency room at once. NOTE: This medicine is only for you. Do not share this medicine with others. What if I miss a dose? If you miss a dose, take it as soon as you can. If it is almost time for your next dose, take only that dose. Do not take double or extra  doses. What may interact with this medicine? Do not take this medicine with any of the following medications: -other gabapentin products This medicine may also interact with the following medications: -alcohol -antacids -antihistamines for allergy, cough and cold -certain medicines for anxiety or sleep -certain medicines for depression or psychotic disturbances -homatropine; hydrocodone -naproxen -narcotic medicines (opiates) for pain -phenothiazines like chlorpromazine, mesoridazine, prochlorperazine, thioridazine This list may not describe all possible interactions. Give your health care provider a list of all the medicines, herbs, non-prescription drugs, or dietary supplements you use. Also tell them if you smoke, drink alcohol, or use illegal drugs. Some items may interact with your medicine. What should I watch for while using this medicine? Visit your doctor or health care professional for regular checks on your progress. You may want to keep a record at home of how you feel your condition is responding to treatment. You may want to share this information with your doctor or health care professional at each visit. You should contact your doctor or health care professional if your seizures get worse or if you have any new types of seizures. Do not stop taking this medicine or any of your seizure medicines unless instructed by your doctor or health care professional. Stopping your medicine suddenly can increase your seizures or their severity. Wear a medical identification bracelet or chain if you are taking this medicine for seizures, and carry a card that lists all your medications. You may get drowsy, dizzy, or have   blurred vision. Do not drive, use machinery, or do anything that needs mental alertness until you know how this medicine affects you. To reduce dizzy or fainting spells, do not sit or stand up quickly, especially if you are an older patient. Alcohol can increase drowsiness and  dizziness. Avoid alcoholic drinks. Your mouth may get dry. Chewing sugarless gum or sucking hard candy, and drinking plenty of water will help. The use of this medicine may increase the chance of suicidal thoughts or actions. Pay special attention to how you are responding while on this medicine. Any worsening of mood, or thoughts of suicide or dying should be reported to your health care professional right away. Women who become pregnant while using this medicine may enroll in the [place] Antiepileptic Drug Pregnancy Registry by calling 1-888-233-2334. This registry collects information about the safety of antiepileptic drug use during pregnancy. What side effects may I notice from receiving this medicine? Side effects that you should report to your doctor or health care professional as soon as possible: -allergic reactions like skin rash, itching or hives, swelling of the face, lips, or tongue -worsening of mood, thoughts or actions of suicide or dying Side effects that usually do not require medical attention (report to your doctor or health care professional if they continue or are bothersome): -constipation -difficulty walking or controlling muscle movements -dizziness -nausea -slurred speech -tiredness -tremors -weight gain This list may not describe all possible side effects. Call your doctor for medical advice about side effects. You may report side effects to FDA at 1-800-FDA-1088. Where should I keep my medicine? Keep out of reach of children. Store at room temperature between 15 and 30 degrees C (59 and 86 degrees F). Throw away any unused medicine after the expiration date. NOTE: This sheet is a summary. It may not cover all possible information. If you have questions about this medicine, talk to your doctor, pharmacist, or health care provider.  2014, [employer]. (2012-10-04 09:12:48)  

## 2013-03-01 ENCOUNTER — Telehealth: Payer: Self-pay | Admitting: Oncology

## 2013-03-01 ENCOUNTER — Encounter: Payer: Self-pay | Admitting: Adult Health

## 2013-03-01 NOTE — Progress Notes (Signed)
This encounter was created in error - please disregard.

## 2013-03-04 ENCOUNTER — Ambulatory Visit: Payer: Medicare Other

## 2013-03-04 ENCOUNTER — Other Ambulatory Visit: Payer: Medicare Other

## 2013-03-04 ENCOUNTER — Ambulatory Visit: Payer: Medicare Other | Admitting: Adult Health

## 2013-03-05 ENCOUNTER — Ambulatory Visit: Payer: Medicare Other

## 2013-03-06 ENCOUNTER — Ambulatory Visit: Payer: Medicare Other

## 2013-03-08 ENCOUNTER — Telehealth: Payer: Self-pay | Admitting: Genetic Counselor

## 2013-03-08 ENCOUNTER — Encounter: Payer: Self-pay | Admitting: Genetic Counselor

## 2013-03-08 NOTE — Telephone Encounter (Signed)
Sister asked that I call her sister Hoyle Sauer.  Reported that she had a negative genetic test.

## 2013-03-08 NOTE — Telephone Encounter (Signed)
Revealed negative genetic testing.    

## 2013-03-18 ENCOUNTER — Encounter: Payer: Self-pay | Admitting: Oncology

## 2013-03-18 ENCOUNTER — Encounter: Payer: Self-pay | Admitting: Adult Health

## 2013-03-18 ENCOUNTER — Other Ambulatory Visit (HOSPITAL_BASED_OUTPATIENT_CLINIC_OR_DEPARTMENT_OTHER): Payer: Medicare Other

## 2013-03-18 ENCOUNTER — Ambulatory Visit (HOSPITAL_BASED_OUTPATIENT_CLINIC_OR_DEPARTMENT_OTHER): Payer: Medicare Other

## 2013-03-18 ENCOUNTER — Ambulatory Visit (HOSPITAL_BASED_OUTPATIENT_CLINIC_OR_DEPARTMENT_OTHER): Payer: Medicare Other | Admitting: Adult Health

## 2013-03-18 VITALS — BP 125/74 | HR 80 | Temp 98.3°F | Resp 19 | Ht 66.0 in | Wt 183.5 lb

## 2013-03-18 DIAGNOSIS — C50111 Malignant neoplasm of central portion of right female breast: Secondary | ICD-10-CM

## 2013-03-18 DIAGNOSIS — Z5112 Encounter for antineoplastic immunotherapy: Secondary | ICD-10-CM

## 2013-03-18 DIAGNOSIS — C50119 Malignant neoplasm of central portion of unspecified female breast: Secondary | ICD-10-CM

## 2013-03-18 DIAGNOSIS — R197 Diarrhea, unspecified: Secondary | ICD-10-CM

## 2013-03-18 DIAGNOSIS — Z170 Estrogen receptor positive status [ER+]: Secondary | ICD-10-CM

## 2013-03-18 DIAGNOSIS — G5690 Unspecified mononeuropathy of unspecified upper limb: Secondary | ICD-10-CM

## 2013-03-18 DIAGNOSIS — G5790 Unspecified mononeuropathy of unspecified lower limb: Secondary | ICD-10-CM

## 2013-03-18 DIAGNOSIS — E860 Dehydration: Secondary | ICD-10-CM

## 2013-03-18 LAB — CBC WITH DIFFERENTIAL/PLATELET
BASO%: 0.2 % (ref 0.0–2.0)
BASOS ABS: 0 10*3/uL (ref 0.0–0.1)
EOS ABS: 0.1 10*3/uL (ref 0.0–0.5)
EOS%: 2.2 % (ref 0.0–7.0)
HCT: 34 % — ABNORMAL LOW (ref 34.8–46.6)
HEMOGLOBIN: 11 g/dL — AB (ref 11.6–15.9)
LYMPH#: 2.1 10*3/uL (ref 0.9–3.3)
LYMPH%: 36.5 % (ref 14.0–49.7)
MCH: 31 pg (ref 25.1–34.0)
MCHC: 32.4 g/dL (ref 31.5–36.0)
MCV: 95.8 fL (ref 79.5–101.0)
MONO#: 0.5 10*3/uL (ref 0.1–0.9)
MONO%: 7.7 % (ref 0.0–14.0)
NEUT%: 53.4 % (ref 38.4–76.8)
NEUTROS ABS: 3.1 10*3/uL (ref 1.5–6.5)
Platelets: 327 10*3/uL (ref 145–400)
RBC: 3.55 10*6/uL — ABNORMAL LOW (ref 3.70–5.45)
RDW: 16.4 % — AB (ref 11.2–14.5)
WBC: 5.8 10*3/uL (ref 3.9–10.3)
nRBC: 0 % (ref 0–0)

## 2013-03-18 LAB — COMPREHENSIVE METABOLIC PANEL (CC13)
ALBUMIN: 3.7 g/dL (ref 3.5–5.0)
ALK PHOS: 110 U/L (ref 40–150)
ALT: 20 U/L (ref 0–55)
AST: 18 U/L (ref 5–34)
Anion Gap: 11 mEq/L (ref 3–11)
BUN: 10.1 mg/dL (ref 7.0–26.0)
CO2: 26 meq/L (ref 22–29)
Calcium: 9.7 mg/dL (ref 8.4–10.4)
Chloride: 107 mEq/L (ref 98–109)
Creatinine: 0.7 mg/dL (ref 0.6–1.1)
GLUCOSE: 142 mg/dL — AB (ref 70–140)
Potassium: 3.9 mEq/L (ref 3.5–5.1)
SODIUM: 144 meq/L (ref 136–145)
TOTAL PROTEIN: 6.3 g/dL — AB (ref 6.4–8.3)
Total Bilirubin: 0.39 mg/dL (ref 0.20–1.20)

## 2013-03-18 MED ORDER — TRASTUZUMAB CHEMO INJECTION 440 MG
6.0000 mg/kg | Freq: Once | INTRAVENOUS | Status: AC
  Administered 2013-03-18: 483 mg via INTRAVENOUS
  Filled 2013-03-18: qty 23

## 2013-03-18 MED ORDER — ACETAMINOPHEN 325 MG PO TABS
ORAL_TABLET | ORAL | Status: AC
  Filled 2013-03-18: qty 2

## 2013-03-18 MED ORDER — DIPHENHYDRAMINE HCL 25 MG PO CAPS: 50.0000 mg | ORAL_CAPSULE | Freq: Once | ORAL | Status: DC

## 2013-03-18 MED ORDER — ACETAMINOPHEN 325 MG PO TABS
650.0000 mg | ORAL_TABLET | Freq: Once | ORAL | Status: AC
  Administered 2013-03-18: 650 mg via ORAL

## 2013-03-18 MED ORDER — DIPHENHYDRAMINE HCL 25 MG PO CAPS
ORAL_CAPSULE | ORAL | Status: AC
  Filled 2013-03-18: qty 1

## 2013-03-18 MED ORDER — SODIUM CHLORIDE 0.9 % IJ SOLN
10.0000 mL | INTRAMUSCULAR | Status: DC | PRN
  Administered 2013-03-18: 10 mL
  Filled 2013-03-18: qty 10

## 2013-03-18 MED ORDER — HEPARIN SOD (PORK) LOCK FLUSH 100 UNIT/ML IV SOLN
500.0000 [IU] | Freq: Once | INTRAVENOUS | Status: AC | PRN
  Administered 2013-03-18: 500 [IU]
  Filled 2013-03-18: qty 5

## 2013-03-18 MED ORDER — SODIUM CHLORIDE 0.9 % IV SOLN
Freq: Once | INTRAVENOUS | Status: AC
  Administered 2013-03-18: 15:00:00 via INTRAVENOUS

## 2013-03-18 NOTE — Progress Notes (Signed)
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 Pathology revealed:   1. Breast, lumpectomy, Right  - INVASIVE GRADE II DUCTAL CARCINOMA, SPANNING 1.6 CM IN GREATEST DIMENSION.  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  - TUMOR INVOLVES DERMIS.  - LYMPH-VASCULAR INVASION IS IDENTIFIED.  - INVASIVE DUCTAL CARCINOMA IS EXTREMELY CLOSE TO ANTERIOR MARGIN (LESS THAN 0.1  MM).  - OTHER MARGINS ARE NEGATIVE.  - SEE ONCOLOGY TEMPLATE.  2. Lymph node, sentinel, biopsy, Right axillary #1  - ONE BENIGN LYMPH NODE WITH NO TUMOR SEEN (0/1).  - SEE COMMENT.  3. Lymph node, sentinel, biopsy, Right axillary #2  - ONE BENIGN LYMPH NODE WITH NO TUMOR SEEN (0/1).  - SEE COMMENT.  4. Lymph node, sentinel, biopsy, Right axillary #3  - ONE BENIGN LYMPH NODE WITH NO TUMOR SEEN (0/1).  - SEE COMMENT.  5. Lymph node, sentinel, biopsy, Right axillary #4  - ONE BENIGN LYMPH NODE WITH NO TUMOR SEEN (0/1).  - SEE COMMENT.  6. Lymph node, biopsy, Right axillary tissue  - ONE BENIGN LYMPH NODE WITH NO TUMOR SEEN (0/1).  - SEE COMMENT.  Microscopic Comment  1. BREAST, INVASIVE TUMOR, WITH LYMPH NODE SAMPLING  Specimen, including laterality: Right partial breast with sentinel lymph node sampling and right axillary  lymph node biopsy.  Procedure: Right breast lumpectomy with sentinel lymph node biopsies and right axillary lymph node biopsy.  Grade: II  Tumor size (gross measurement): 1.6 cm.  Margins:  Invasive, distance to closest margin: Less than 0.1 mm to anterior margin.  In-situ, distance to closest margin: At least 0.2 cm.  Lymphovascular invasion: Yes.  Ductal carcinoma in situ: Yes.  Grade: Intermediate grade.  Extensive intraductal component: No.  Lobular neoplasia: Not identified.  Tumor focality: Unifocal.  Treatment effect: Not applicable.  Extent of tumor:  Skin: Invasive ductal carcinoma involves dermis of  skin.  Breast: Invasive ductal carcinoma involves breast parenchyma.  Additional Site: No additional sites received.  Lymph nodes:  # examined: 5.  Lymph nodes with metastasis: 0.  Breast prognostic profile: Performed on previous case [id number]  Estrogen receptor: 100%, positive.  Progesterone receptor: 100%, positive.  Her 2 neu: 2.96, amplified.  Ki-67: 45%.  Non-neoplastic breast: Unremarkable.  TNM: pT1c, pN0, MX.   She was started on adjuvant chemotherapy consisting of [place] (Taxotere/Carboplatin/Herceptin).Due to her difficulty with the treatment, her age, and ER/PR positivity, she completed 4 cycles of TCH rather than 6. She completed [facility] therapy on 02/18/13 - Will take herceptin for 1 year.      Receptor Status: ER(100%), PR (100%), Her2-neu (2.96), Ki-67(45&)  Did patient present with symptoms: Angela Rosario herself palpated a mass approximately 6 months after her annual mammogram. Diagnostic mammogram on 09/11/2012 showed a 7 mm mass at the 12 o'clock position. Ultrasound showed the mass to be 7 mm at the 12 o'clock position.   Past/Anticipated interventions by surgeon, if any: Lumpectomy of Right Breast  Past/Anticipated interventions by medical oncology, if any: Chemotherapy: She was started on adjuvant chemotherapy consisting of [place] (Taxotere/Carboplatin/Herceptin).Due to her difficulty with the treatment, her age, and ER/PR positivity, she completed 4 cycles of TCH rather than 6. She completed [facility] therapy on 02/18/13 - Will take herceptin for 1 year  Lymphedema issues, if any: None noted   Pain issues, if any:  Patient denies pain   SAFETY ISSUES:  Prior radiation Denies  Pacemaker/ICD Denies  Possible current  pregnancy No      Is the patient on methotrexate? No  Current Complaints / other details:  Neuropathy - Gabapentin

## 2013-03-18 NOTE — Progress Notes (Signed)
[place]  [phone]) [phone] Fax:(336) [phone]  OFFICE PROGRESS NOTE  ID: Angela Rosario   DOB: 1940/05/26  MR#: [id number]  JYN#:[id number]   PCP: Haywood Pao, MD SU: [name], M.D.   DIAGNOSIS:  Angela Rosario is a 73 y.o. female diagnosed in 08/2012 with stage I, invasive ductal carcinoma of the right breast, estrogen receptor positive, progesterone receptor positive, Angela-2/neu positive with Ki-67 of 45%.    PRIOR THERAPY: 1.  [name] herself palpated a mass approximately 6 months after Angela annual mammogram.  Diagnostic mammogram on 09/11/2012 showed a 7 mm mass at the 12 o'clock position. Ultrasound showed the mass to be 7 mm at the 12 o'clock position.  Right breast needle core biopsy of the 12 o'clock position on 09/12/2012  showed invasive mammary carcinoma with lobular features, grade 2, estrogen receptor 100% positive, progesterone receptor 100% positive, Ki-67 45%, Angela-2/neu by CISH showed amplification with ratio 2.96.  Bilateral breast MRI on 09/18/2012 showed a 1.5 x 1.7 x 1.5 cm irregular mass in the right breast with no mass or abnormal enhancements in the left breast or lymph nodes (clinical stage I, T1 N0).   2.  Status post right breast lumpectomy with right axillary sentinel lymph node biopsy on 10/04/2012 for a stage I, pT1c, pN0, MX, 1.6 cm invasive ductal carcinoma, grade 2 with associated intermediate grade ductal carcinoma in situ, tumor involved the dermis and lymphovascular invasion was identified, close to anterior margin, other margins were negative, estrogen receptor 100% positive, chest receptor 100% positive, Ki-67 45%, Angela-2/neu amplified, with 0/5 metastatic right axillary lymph nodes.  3.  Adjuvant chemotherapy consisting of [place] (Taxotere/Carboplatin/Herceptin) originally scheduled to start on 11/19/2012 however the patient suffered a fall on 11/06/2012 and subsequently underwent arm fracture repair  surgery. She received three weeks of Herceptin therapy that started on 11/19/2012 and adjuvant chemotherapy consisting of [place] started on 12/10/2012.  She completed therapy [place] therapy on 02/18/13 and began adjuvant every 3 week Herceptin on 02/25/13.     CURRENT THERAPY:  Herceptin every three weeks.    INTERVAL HISTORY: Angela Rosario is a 73 y.o. female who returns for evaluation prior to adjuvant ever three week herceptin.  She is doing well today.  She is c/o of increased diarrhea to the point of incontinence with sneezing, or coughing today.  She has started taking Magnesium supplements daily and noticed an increase in the frequency and consistency of Angela bowel movements at that time.  She also continues to have numbness in Angela fingertips and Angela toes.  The numbness is slightly improved in Angela fingertips, however it is unchanged in Angela toes.  She did not f/u with Dr. Fredna Dow as recommended.  She stated that she didn't want to due to the fact that the numbness now effects all of Angela fingers and toes, and so she doesn't think its anything with Angela wrist.  Otherwise, she denies fevers, chills, pain, nausea, vomiting, constipation, skin changes, PND, orthopnea, DOE, swelling, or any further concerns.   MEDICAL HISTORY: Past Medical History  Diagnosis Date  . Syncope   . Diastolic dysfunction   . Hyperlipidemia   . Episode of dizziness     Mild episodes  . Orthostatic hypotension     Some episodes  . Arthritis     oa  . Stress incontinence, female     wears pads  . AC (acromioclavicular) joint bone spurs     bone spurs in neck  .  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 Diabetes mellitus     niddm; last A1C 6.2  . Skin cancer   . Hypertension   . Wears glasses     ALLERGIES:   Allergies  Allergen Reactions  . Oxycodone Other (See Comments)    hallucinations    MEDICATIONS:  Current Outpatient Prescriptions  Medication Sig Dispense Refill  . B Complex-C  (SUPER B COMPLEX PO) Take 1 tablet by mouth every morning.      . carvedilol (COREG) 6.25 MG tablet Take 1 tablet (6.25 mg total) by mouth 2 (two) times daily with a meal.  180 tablet  3  . gabapentin (NEURONTIN) 100 MG capsule Take 1 capsule (100 mg total) by mouth 3 (three) times daily.  90 capsule  0  . glyBURIDE-metformin (GLUCOVANCE) 2.5-500 MG per tablet Take 1 tablet by mouth daily with breakfast.       . lidocaine-prilocaine (EMLA) cream Apply topically as needed.  30 g  1  . lovastatin (MEVACOR) 10 MG tablet Take 10 mg by mouth at bedtime.       . Multiple Vitamin (MULTIVITAMIN) capsule Take 1 capsule by mouth daily.      Marland Kitchen tretinoin (RETIN-A) 0.025 % cream Apply topically at bedtime.      . Alum & Mag Hydroxide-Simeth (MAGIC MOUTHWASH W/LIDOCAINE) SOLN Take 5 mLs by mouth 4 (four) times daily as needed. Swish and spit, do not swallow  100 mL  4  . UNABLE TO FIND Rx: L8015-Post Mastectomy Camisole (Quantity: 2); L8000-Post Surgical Bras (Quantity: 6) Dx: 174.9 Right partial mastectomy  1 each  0  . 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  1 Units  0   No current facility-administered medications for this visit.   Facility-Administered Medications Ordered in Other Visits  Medication Dose Route Frequency Provider Last Rate Last Dose  . diphenhydrAMINE (BENADRYL) capsule 50 mg  50 mg Oral Once Minette Headland, NP      . gentamicin (GARAMYCIN) 160 mg in dextrose 5 % 50 mL IVPB  160 mg Intravenous 30 min Pre-Op Franchot Gallo, MD      . heparin lock flush 100 unit/mL  500 Units Intracatheter Once PRN Minette Headland, NP      . sodium chloride 0.9 % injection 10 mL  10 mL Intracatheter PRN Minette Headland, NP      . trastuzumab (HERCEPTIN) 483 mg in sodium chloride 0.9 % 250 mL chemo infusion  6 mg/kg (Treatment Plan Actual) Intravenous Once Minette Headland, NP        SURGICAL HISTORY:  Past Surgical History  Procedure Laterality Date  . Other  surgical history      Hysterectomy  . Cholecystectomy  1992  . Abdominal hysterectomy  1985    1 ovary removed  . Total hip arthroplasty  02/23/2011    Procedure: TOTAL HIP ARTHROPLASTY;  Surgeon: Dione Plover Aluisio;  Location: [facility];  Service: Orthopedics;  Laterality: Left;  . Pubovaginal sling  02/03/2012    Procedure: Gaynelle Arabian;  Surgeon: Franchot Gallo, MD;  Location: [facility];  Service: Urology;  Laterality: N/A;  1 HR  LYNX SLING  . Tonsillectomy    . Breast lumpectomy with needle localization and axillary sentinel lymph node bx Right 10/04/2012    Procedure: RIGHT BREAST LUMPECTOMY WITH NEEDLE LOCALIZATION AND AXILLARY SENTINEL LYMPH NODE BX;  Surgeon: [name], MD;  Location: [facility];  Service: General;  Laterality: Right;  . Portacath placement Left 10/04/2012    Procedure: INSERTION PORT-A-CATH;  Surgeon: [name], MD;  Location: [place];  Service: General;  Laterality: Left;  . Orif wrist fracture Left 11/06/2012    Procedure: LEFT OPEN REDUCTION INTERNAL FIXATION (ORIF) DISTAL RADIUS WRIST FRACTURE;  Surgeon: Tennis Must, MD;  Location: [place];  Service: Orthopedics;  Laterality: Left;    REVIEW OF SYSTEMS:  A 10 point review of systems was completed except as noted above.   PHYSICAL EXAMINATION: Blood pressure 125/74, pulse 80, temperature 98.3 F (36.8 C), temperature source Oral, resp. rate 19, height '5\' 6"'  (1.676 m), weight 183 lb 8 oz (83.235 kg). Body mass index is 29.63 kg/(m^2). GENERAL: Patient is a well appearing female in no acute distress HEENT:  Sclerae anicteric.  Oropharynx clear and moist. No ulcerations or evidence of oropharyngeal candidiasis. Neck is supple.  NODES:  No cervical, supraclavicular, or axillary lymphadenopathy palpated.  BREAST EXAM:  Deferred. LUNGS:  Clear to auscultation bilaterally.  No wheezes or rhonchi. HEART:  Regular rate and rhythm. No  murmur appreciated. ABDOMEN:  Soft, nontender.  Positive, normoactive bowel sounds. No organomegaly palpated. MSK:  No focal spinal tenderness to palpation. Full range of motion bilaterally in the upper extremities. EXTREMITIES:  No peripheral edema.   SKIN:  Clear with no obvious rashes or skin changes. No nail dyscrasia. NEURO:  Nonfocal. Well oriented.  Appropriate affect. ECOG PERFORMANCE STATUS: 1 - Symptomatic but completely ambulatory  LABORATORY DATA: Lab Results  Component Value Date   WBC 5.8 03/18/2013   HGB 11.0* 03/18/2013   HCT 34.0* 03/18/2013   MCV 95.8 03/18/2013   PLT 327 03/18/2013      Chemistry      Component Value Date/Time   NA 142 02/25/2013 1120   NA 135 11/01/2012 1350   K 3.5 02/25/2013 1120   K 4.2 11/01/2012 1350   CL 96 11/01/2012 1350   CO2 28 02/25/2013 1120   CO2 26 11/01/2012 1350   BUN 5.9* 02/25/2013 1120   BUN 10 11/01/2012 1350   CREATININE 0.7 02/25/2013 1120   CREATININE 0.64 11/01/2012 1350      Component Value Date/Time   CALCIUM 9.3 02/25/2013 1120   CALCIUM 9.7 11/01/2012 1350   ALKPHOS 110 02/25/2013 1120   ALKPHOS 77 02/17/2011 1045   AST 15 02/25/2013 1120   AST 26 02/17/2011 1045   ALT 13 02/25/2013 1120   ALT 36* 02/17/2011 1045   BILITOT 0.41 02/25/2013 1120   BILITOT 0.5 02/17/2011 1045       RADIOGRAPHIC STUDIES: No results found.   ASSESSMENT:  Angela Rosario is a 73 y.o. female:  1.  Status post right breast lumpectomy with right axillary sentinel lymph node biopsy on 10/04/2012 for a stage I, pT1c, pN0, MX, 1.6 cm invasive ductal carcinoma, grade 2 with associated intermediate grade ductal carcinoma in situ, tumor involved the dermis and lymphovascular invasion was identified, close to anterior margin, other margins were negative, estrogen receptor 100% positive, progesterone receptor 100% positive, Ki-67 45%, Angela-2/neu amplified, with 0/5 metastatic right axillary lymph nodes.  2.  Adjuvant chemotherapy consisting of [place]  (Taxotere/Carboplatin/Herceptin) originally scheduled to start on 11/19/2012 however the patient suffered a fall on 11/06/2012 and subsequently underwent arm fracture repair surgery. She received three weeks of Herceptin therapy that started on 11/19/2012 and adjuvant chemotherapy consisting of [place] started on 12/10/2012 through 02/11/13.  3.  Dehydration: Patient required IV fluids when she received [place] combination therapy, along with when she receives Angela weekly Herceptin.  However, due to Angela confusion following chemotherapy for two days, and, Angela requiring extensive hydration the week following chemotherapy, the [facility] was discontinued after four cycles.    PLAN:  #1   Angela Rosario is doing well today.  Angela CBC is stable, I reviewed it with Angela in detail.  She will proceed with herceptin today.  Angela CMP is pending. She was evalauted by Dr. Haroldine Laws on 02/20/13 and cleared to continue Herceptin therapy.    #2  She will proceed with consultation with Dr. Isidore Moos tomorrow for adjuvant radiation therapy.    #3  She will continue Gabapentin for Angela neuropathy.  She is going to start taking 419m qhs.  She has declined f/u with Dr. KFredna Dowregarding Angela left wrist.    #4  I recommended she stop taking the Magnesium supplement daily.    #5  She will return in 3 weeks for labs, evaluation, and Herceptin therapy.    All questions answered.  Ms. GFolgerand Angela sister CHoyle Sauerwere encouraged to contact uKoreain the interim with any questions, concerns, or problems.  I spent 25 minutes counseling the patient face to face.  The total time spent in the appointment was 30 minutes.  LMinette Headland [id number]/03/2013, 3:32 PM

## 2013-03-18 NOTE — Patient Instructions (Signed)

## 2013-03-18 NOTE — Progress Notes (Signed)
Radiation Oncology         [phone] ________________________________  Name: Angela Rosario MRN: [id number]  Date: 03/19/2013  DOB: [birth year]  Follow-Up Visit Note  Outpatient  CC: Haywood Pao, MD  Deatra Robinson, MD  Diagnosis:   Clinical T1c. N0 M0 right breast invasive mammary carcinoma, grade 2 ER/PR positive HER-2/neu positive Pathologic T1c N0 INVASIVE GRADE II DUCTAL CARCINOMA, ER/PR positive HER-2/neu positive   Narrative:  The patient returns today for follow-up.    She underwent surgery on 10-04-12.  Pathology revealed  1. Breast, lumpectomy, Right - INVASIVE GRADE II DUCTAL CARCINOMA, SPANNING 1.6 CM IN GREATEST DIMENSION. 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 - TUMOR INVOLVES DERMIS. - LYMPH-VASCULAR INVASION IS IDENTIFIED. - INVASIVE DUCTAL CARCINOMA IS EXTREMELY CLOSE TO ANTERIOR MARGIN (LESS THAN 0.1 MM). - OTHER MARGINS ARE NEGATIVE. - SEE ONCOLOGY TEMPLATE. 2. Lymph node, sentinel, biopsy, Right axillary #1 - ONE BENIGN LYMPH NODE WITH NO TUMOR SEEN (0/1). - SEE COMMENT. 3. Lymph node, sentinel, biopsy, Right axillary #2 - ONE BENIGN LYMPH NODE WITH NO TUMOR SEEN (0/1). - SEE COMMENT. 4. Lymph node, sentinel, biopsy, Right axillary #3 - ONE BENIGN LYMPH NODE WITH NO TUMOR SEEN (0/1). - SEE COMMENT. 5. Lymph node, sentinel, biopsy, Right axillary #4 - ONE BENIGN LYMPH NODE WITH NO TUMOR SEEN (0/1). - SEE COMMENT. 6. Lymph node, biopsy, Right axillary tissue - ONE BENIGN LYMPH NODE WITH NO TUMOR SEEN (0/1). - SEE COMMENT.  Microscopic Comment 1. BREAST, INVASIVE TUMOR, WITH LYMPH NODE SAMPLING Specimen, including laterality: Right partial breast with sentinel lymph node sampling and right axillary lymph node biopsy. Procedure: Right breast lumpectomy with sentinel lymph node biopsies and right axillary lymph node biopsy. Grade: II  Tumor size (gross measurement): 1.6 cm. Margins: Invasive, distance to closest margin:  Less than 0.1 mm to anterior margin. In-situ, distance to closest margin: At least 0.2 cm. Lymphovascular invasion: Yes. Ductal carcinoma in situ: Yes. Grade: Intermediate grade. Extensive intraductal component: No. Lobular neoplasia: Not identified. Tumor focality: Unifocal. Treatment effect: Not applicable. Extent of tumor: Skin: Invasive ductal carcinoma involves dermis of skin. Breast: Invasive ductal carcinoma involves breast parenchyma. Additional Site: No additional sites received. Lymph nodes: # examined: 5. Lymph nodes with metastasis: 0. Breast prognostic profile: Performed on previous case [id number] Estrogen receptor: 100%, positive. Progesterone receptor: 100%, positive. Her 2 neu: 2.96, amplified. Ki-67: 45%. Non-neoplastic breast: Unremarkable. TNM: pT1c, pN0, MX.  She was started on adjuvant chemotherapy consisting of [place] (Taxotere/Carboplatin/Herceptin). Due to her difficulty with the treatment, her age, and ER/PR positivity, she completed 4 cycles of TCH rather than 6. She completed [facility] therapy on 02/18/13 - Will take herceptin for 1 year. Symptomatically, she denies any pain issues or lymphedema. She does have neuropathy and takes gabapentin for this.                   ALLERGIES:  is allergic to oxycodone.  Meds: Current Outpatient Prescriptions  Medication Sig Dispense Refill  . B Complex-C (SUPER B COMPLEX PO) Take 1 tablet by mouth every morning.      . carvedilol (COREG) 6.25 MG tablet Take 1 tablet (6.25 mg total) by mouth 2 (two) times daily with a meal.  180 tablet  3  . gabapentin (NEURONTIN) 100 MG capsule Take 1 capsule (100 mg total) by mouth 3 (three) times daily.  90 capsule  0  . glyBURIDE-metformin (GLUCOVANCE) 2.5-500 MG per tablet Take 1 tablet by mouth daily  with breakfast.       . lidocaine-prilocaine (EMLA) cream Apply topically as needed.  30 g  1  . lovastatin (MEVACOR) 10 MG tablet Take 10 mg by mouth at bedtime.       . Multiple  Vitamin (MULTIVITAMIN) capsule Take 1 capsule by mouth daily.      Marland Kitchen tretinoin (RETIN-A) 0.025 % cream Apply topically at bedtime.      Marland Kitchen UNABLE TO FIND Rx: L8015-Post Mastectomy Camisole (Quantity: 2); L8000-Post Surgical Bras (Quantity: 6) Dx: 174.9 Right partial mastectomy  1 each  0  . 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  1 Units  0  . Alum & Mag Hydroxide-Simeth (MAGIC MOUTHWASH W/LIDOCAINE) SOLN Take 5 mLs by mouth 4 (four) times daily as needed. Swish and spit, do not swallow  100 mL  4   No current facility-administered medications for this encounter.   Facility-Administered Medications Ordered in Other Encounters  Medication Dose Route Frequency Provider Last Rate Last Dose  . gentamicin (GARAMYCIN) 160 mg in dextrose 5 % 50 mL IVPB  160 mg Intravenous 30 min Pre-Op Franchot Gallo, MD        Physical Findings: The patient is in no acute distress. Patient is alert and oriented.  height is '5\' 6"'  (1.676 m) and weight is 182 lb 14.4 oz (82.963 kg). Her oral temperature is 98 F (36.7 C). Her blood pressure is 116/69 and her pulse is 78. Her respiration is 16 and oxygen saturation is 100%. .   Good range of motion in her arms. No upper extremity lymphedema. No palpable cervical or supraclavicular nodes. Well-healed right central periareolar lumpectomy scar and right axillary scar. No lesions of concern palpated in either breast or axillary region.  Lab Findings: Lab Results  Component Value Date   WBC 5.8 03/18/2013   HGB 11.0* 03/18/2013   HCT 34.0* 03/18/2013   MCV 95.8 03/18/2013   PLT 327 03/18/2013     Radiographic Findings: Dg Forearm Left  02/25/2013   CLINICAL DATA:  Left hand numbness. Left wrist surgery 5 months ago.  EXAM: LEFT FOREARM - 2 VIEW  COMPARISON:  None.  FINDINGS: There is a malleable plate and screws transfixing a healed distal radial metaphysis fracture in anatomic alignment. There is no hardware failure or complication. There is  no other fracture or dislocation. The soft tissues are normal.  IMPRESSION: No acute osseous injury of the left forearm.   Electronically Signed   By: Kathreen Devoid   On: 02/25/2013 15:54    Impression/Plan:  We discussed adjuvant radiotherapy today.  I recommend radiotherapy to the whole right breast in order to decrease risk of in breast recurrence by 2/3.  The risks, benefits and side effects of this treatment were discussed in detail.  She understands that radiotherapy is associated with skin irritation and fatigue in the acute setting. Late effects can include cosmetic changes and rare injury to internal organs.   She is enthusiastic about proceeding with treatment. A consent form was signed and placed in her chart.  Simulation to occur later this week. Anticipate approximately 6 weeks of radiotherapy.   I spent 30 minutes face to face with the patient and more than 50% of that time was spent in counseling and/or coordination of care. _____________________________________   Eppie Gibson, MD

## 2013-03-19 ENCOUNTER — Ambulatory Visit
Admission: RE | Admit: 2013-03-19 | Discharge: 2013-03-19 | Disposition: A | Discharge status: Home / Self Care | Payer: Medicare Other | Source: Ambulatory Visit | Attending: Radiation Oncology | Admitting: Radiation Oncology

## 2013-03-19 ENCOUNTER — Encounter: Payer: Self-pay | Admitting: Radiation Oncology

## 2013-03-19 VITALS — BP 116/69 | HR 78 | Temp 98.0°F | Resp 16 | Ht 66.0 in | Wt 182.9 lb

## 2013-03-19 DIAGNOSIS — Z9010 Acquired absence of unspecified breast and nipple: Secondary | ICD-10-CM | POA: Insufficient documentation

## 2013-03-19 DIAGNOSIS — Z79899 Other long term (current) drug therapy: Secondary | ICD-10-CM | POA: Insufficient documentation

## 2013-03-19 DIAGNOSIS — L659 Nonscarring hair loss, unspecified: Secondary | ICD-10-CM | POA: Insufficient documentation

## 2013-03-19 DIAGNOSIS — C50119 Malignant neoplasm of central portion of unspecified female breast: Secondary | ICD-10-CM

## 2013-03-19 DIAGNOSIS — R209 Unspecified disturbances of skin sensation: Secondary | ICD-10-CM | POA: Insufficient documentation

## 2013-03-19 DIAGNOSIS — T451X5A Adverse effect of antineoplastic and immunosuppressive drugs, initial encounter: Secondary | ICD-10-CM | POA: Insufficient documentation

## 2013-03-19 DIAGNOSIS — C50919 Malignant neoplasm of unspecified site of unspecified female breast: Secondary | ICD-10-CM | POA: Insufficient documentation

## 2013-03-19 DIAGNOSIS — Z170 Estrogen receptor positive status [ER+]: Secondary | ICD-10-CM | POA: Insufficient documentation

## 2013-03-19 HISTORY — DX: Malignant neoplasm of unspecified site of unspecified female breast: C50.919

## 2013-03-19 NOTE — Progress Notes (Signed)
Patient here today with her sister for consultation with Dr. Isidore Moos reference right breast ca s/p chemotherapy. Patient denies pain at this time. No lymphedema of upper extremities noted. Full ROM of both upper extremities demonstrated. Patient concerned that radiation over time will restrict her ROM. Explain this was not the case. Provided patient with ABC flyer and encouraged her to attend to learn ways to strengthen her upper body. Patient verbalized understanding. Patient reports neuropathy of fingers and toes related to effects of radiation therapy but, believes the neurontin is helping. Patients next dose of herceptin is scheduled for 04/08/2013. Patient denies chest pain or tenderness.

## 2013-03-19 NOTE — Progress Notes (Signed)
See progress note under physician encounter. 

## 2013-03-20 NOTE — Progress Notes (Addendum)
  Radiation Oncology         [phone] ________________________________  Name: Angela Rosario MRN: [id number]  Date: 03/22/2013  DOB: May 11, 1940  SIMULATION AND TREATMENT PLANNING NOTE  Outpatient  DIAGNOSIS: Right Breast cancer, stage I   NARRATIVE: The patient was brought to the [place] suite. Identity was confirmed. All relevant records and images related to the planned course of therapy were reviewed. The patient freely provided informed written consent to proceed with treatment after reviewing the details related to the planned course of therapy. The consent form was witnessed and verified by the simulation staff.  Then, the patient was set-up in a stable reproducible supine position for radiation therapy. CT images were obtained. Surface markings were placed. The CT images were loaded into the planning software.    Optical Surface Tracking Plan:  Since intensity modulated radiotherapy (IMRT) and 3D conformal radiation treatment methods are predicated on accurate and precise positioning for treatment, intrafraction motion monitoring is medically necessary to ensure accurate and safe treatment delivery. The ability to quantify intrafraction motion without excessive ionizing radiation dose can only be performed with optical surface tracking. Accordingly, surface imaging offers the opportunity to obtain 3D measurements of patient position throughout IMRT and 3D treatments without excessive radiation exposure. I am ordering optical surface tracking for this patient's upcoming course of radiotherapy.  ________________________________   Reference:  Ursula Alert, J, et al. Surface imaging-based analysis of intrafraction motion for breast radiotherapy patients.Journal of [place], n. 6, nov. 2014. ISSN [id number].  Available at: <http://www.jacmp.org/index.php/jacmp/article/view/4957>.    TREATMENT PLANNING NOTE: Treatment planning  then occurred. The radiation prescription was entered and confirmed.  A total of 6 medically necessary complex treatment devices were fabricated and supervised by me: 2 parent blocks with MLCs, 3 field in fields with MLC's for dose homogeneity, and vac lock. I have requested : MLC's for all fields for custom blocking against lungs, heart. I ordered vac loc to immobilize arms. I have ordered 3D conformal radiotherapy to spare the lungs, heart and measure dose heterogeneity. I have requested a DVH of these structures and the lumpectomy cavity.   The patient will receive 50 Gy in 25 fractions to her right breast followed by a boost of 10 Gy in 5 fractions to the lumpectomy cavity.   -----------------------------------  Eppie Gibson, MD

## 2013-03-22 ENCOUNTER — Ambulatory Visit
Admission: RE | Admit: 2013-03-22 | Discharge: 2013-03-22 | Disposition: A | Discharge status: Home / Self Care | Payer: Medicare Other | Source: Ambulatory Visit | Attending: Radiation Oncology | Admitting: Radiation Oncology

## 2013-03-22 DIAGNOSIS — Z170 Estrogen receptor positive status [ER+]: Secondary | ICD-10-CM | POA: Insufficient documentation

## 2013-03-22 DIAGNOSIS — C50919 Malignant neoplasm of unspecified site of unspecified female breast: Secondary | ICD-10-CM | POA: Insufficient documentation

## 2013-03-22 DIAGNOSIS — Z510 Encounter for antineoplastic radiation therapy: Secondary | ICD-10-CM | POA: Insufficient documentation

## 2013-03-22 DIAGNOSIS — C50119 Malignant neoplasm of central portion of unspecified female breast: Secondary | ICD-10-CM

## 2013-03-25 ENCOUNTER — Other Ambulatory Visit: Payer: Self-pay | Admitting: Oncology

## 2013-03-29 ENCOUNTER — Encounter: Payer: Self-pay | Admitting: Radiation Oncology

## 2013-03-29 ENCOUNTER — Ambulatory Visit
Admission: RE | Admit: 2013-03-29 | Discharge: 2013-03-29 | Disposition: A | Discharge status: Home / Self Care | Payer: Medicare Other | Source: Ambulatory Visit | Attending: Radiation Oncology | Admitting: Radiation Oncology

## 2013-04-01 ENCOUNTER — Ambulatory Visit
Admission: RE | Admit: 2013-04-01 | Discharge: 2013-04-01 | Disposition: A | Discharge status: Home / Self Care | Payer: Medicare Other | Source: Ambulatory Visit | Attending: Radiation Oncology | Admitting: Radiation Oncology

## 2013-04-01 ENCOUNTER — Encounter: Payer: Self-pay | Admitting: Radiation Oncology

## 2013-04-01 ENCOUNTER — Other Ambulatory Visit: Payer: Medicare Other | Admitting: Radiation Oncology

## 2013-04-01 VITALS — BP 123/71 | HR 83 | Temp 98.3°F | Ht 66.0 in | Wt 183.0 lb

## 2013-04-01 DIAGNOSIS — C50119 Malignant neoplasm of central portion of unspecified female breast: Secondary | ICD-10-CM

## 2013-04-01 MED ORDER — RADIAPLEXRX EX GEL
Freq: Once | CUTANEOUS | Status: AC
  Administered 2013-04-01: 13:00:00 via TOPICAL

## 2013-04-01 MED ORDER — ALRA NON-METALLIC DEODORANT (RAD-ONC)
1.0000 "application " | Freq: Once | TOPICAL | Status: AC
  Administered 2013-04-01: 1 via TOPICAL

## 2013-04-01 NOTE — Progress Notes (Signed)
   Weekly Management Note:  outpatient Current Dose:  2 Gy  Projected Dose: 60 Gy   Narrative:  The patient presents for routine under treatment assessment.  CBCT/MVCT images/Port film x-rays were reviewed.  The chart was checked. No complaints  Physical Findings:  height is 5\' 6"  (1.676 m) and weight is 183 lb (83.008 kg). Her temperature is 98.3 F (36.8 C). Her blood pressure is 123/71 and her pulse is 83.  NAD  Impression:  The patient is tolerating radiotherapy.  Plan:  Continue radiotherapy as planned.   ________________________________   Eppie Gibson, M.D.

## 2013-04-01 NOTE — Addendum Note (Signed)
Encounter addended by: Deirdre Evener, RN on: 04/01/2013  4:39 PM<BR>     Documentation filed: Notes Section, Inpatient Patient Education, Orders

## 2013-04-01 NOTE — Addendum Note (Signed)
Encounter addended by: Deirdre Evener, RN on: 04/01/2013  4:56 PM<BR>     Documentation filed: Inpatient MAR

## 2013-04-01 NOTE — Progress Notes (Signed)
At 12:45pm Education performed regarding Radiation therapy to the breast inclusive of Skin care, pain, and fatigue.  Given Radiaplex Gel to apply BID after treatment and at bedtime to moisturize and sooth skin.  Given the RadiationTherapy and You booklet with the appropriate pages marked.  She stated understanding.  Informed that Dr. Isidore Moos sees patients every Monday following her treatment, but qualified that if she has any concerns during the week, then she can be seen at any time and she stated understanding.  Also informed that if Dr. Isidore Moos is out of office, she will be notified in advance and may be seen by another radiation oncologist.  Sister present

## 2013-04-02 ENCOUNTER — Ambulatory Visit
Admission: RE | Admit: 2013-04-02 | Discharge: 2013-04-02 | Disposition: A | Discharge status: Home / Self Care | Payer: Medicare Other | Source: Ambulatory Visit | Attending: Radiation Oncology | Admitting: Radiation Oncology

## 2013-04-03 ENCOUNTER — Ambulatory Visit
Admission: RE | Admit: 2013-04-03 | Discharge: 2013-04-03 | Disposition: A | Discharge status: Home / Self Care | Payer: Medicare Other | Source: Ambulatory Visit | Attending: Radiation Oncology | Admitting: Radiation Oncology

## 2013-04-04 ENCOUNTER — Encounter: Payer: Self-pay | Admitting: Radiation Oncology

## 2013-04-04 ENCOUNTER — Ambulatory Visit: Payer: Medicare Other

## 2013-04-04 NOTE — Progress Notes (Signed)
04/03/13:  Patient's sister, Patrice Paradise, called asking about the cost of radiation and how much patient would end up paying.  I attempted to call [employer] on 04/02/13 twice and they were closed.  Finally reached a representative, Monique A. On 04/04/13 8:41am.  Patient's plan has no deductible and an OOP max of $4,500 which she currently has met $2,166.32 and pays 85% of allowed amount; however, therapeutic radiation treatment is covered at 100% of allowed amount.  Called Hoyle Sauer and gave her this information.  She was very pleased to hear this information.

## 2013-04-05 ENCOUNTER — Ambulatory Visit
Admission: RE | Admit: 2013-04-05 | Discharge: 2013-04-05 | Disposition: A | Discharge status: Home / Self Care | Payer: Medicare Other | Source: Ambulatory Visit | Attending: Radiation Oncology | Admitting: Radiation Oncology

## 2013-04-08 ENCOUNTER — Encounter: Payer: Self-pay | Admitting: Oncology

## 2013-04-08 ENCOUNTER — Ambulatory Visit
Admission: RE | Admit: 2013-04-08 | Discharge: 2013-04-08 | Disposition: A | Discharge status: Home / Self Care | Payer: Medicare Other | Source: Ambulatory Visit | Attending: Radiation Oncology | Admitting: Radiation Oncology

## 2013-04-08 ENCOUNTER — Encounter: Payer: Self-pay | Admitting: *Deleted

## 2013-04-08 ENCOUNTER — Other Ambulatory Visit (HOSPITAL_BASED_OUTPATIENT_CLINIC_OR_DEPARTMENT_OTHER): Payer: Medicare Other

## 2013-04-08 ENCOUNTER — Ambulatory Visit (HOSPITAL_BASED_OUTPATIENT_CLINIC_OR_DEPARTMENT_OTHER): Payer: Medicare Other

## 2013-04-08 ENCOUNTER — Inpatient Hospital Stay
Admission: RE | Admit: 2013-04-08 | Discharge: 2013-04-08 | Disposition: A | Discharge status: Home / Self Care | Payer: Medicare Other | Source: Ambulatory Visit | Attending: Radiation Oncology | Admitting: Radiation Oncology

## 2013-04-08 ENCOUNTER — Ambulatory Visit (HOSPITAL_BASED_OUTPATIENT_CLINIC_OR_DEPARTMENT_OTHER): Payer: Medicare Other | Admitting: Oncology

## 2013-04-08 VITALS — BP 132/76 | HR 76 | Temp 97.7°F | Resp 18 | Ht 66.0 in | Wt 182.2 lb

## 2013-04-08 VITALS — BP 114/73 | HR 73 | Temp 97.9°F

## 2013-04-08 DIAGNOSIS — C50119 Malignant neoplasm of central portion of unspecified female breast: Secondary | ICD-10-CM

## 2013-04-08 DIAGNOSIS — Z170 Estrogen receptor positive status [ER+]: Secondary | ICD-10-CM

## 2013-04-08 DIAGNOSIS — C50111 Malignant neoplasm of central portion of right female breast: Secondary | ICD-10-CM

## 2013-04-08 DIAGNOSIS — Z5112 Encounter for antineoplastic immunotherapy: Secondary | ICD-10-CM

## 2013-04-08 LAB — COMPREHENSIVE METABOLIC PANEL (CC13)
ALK PHOS: 93 U/L (ref 40–150)
ALT: 14 U/L (ref 0–55)
AST: 17 U/L (ref 5–34)
Albumin: 4 g/dL (ref 3.5–5.0)
Anion Gap: 11 mEq/L (ref 3–11)
BILIRUBIN TOTAL: 0.49 mg/dL (ref 0.20–1.20)
BUN: 12.2 mg/dL (ref 7.0–26.0)
CALCIUM: 9.5 mg/dL (ref 8.4–10.4)
CHLORIDE: 105 meq/L (ref 98–109)
CO2: 25 mEq/L (ref 22–29)
CREATININE: 0.7 mg/dL (ref 0.6–1.1)
Glucose: 180 mg/dl — ABNORMAL HIGH (ref 70–140)
Potassium: 3.9 mEq/L (ref 3.5–5.1)
Sodium: 141 mEq/L (ref 136–145)
Total Protein: 6.5 g/dL (ref 6.4–8.3)

## 2013-04-08 LAB — CBC WITH DIFFERENTIAL/PLATELET
BASO%: 0.3 % (ref 0.0–2.0)
BASOS ABS: 0 10*3/uL (ref 0.0–0.1)
EOS ABS: 0.2 10*3/uL (ref 0.0–0.5)
EOS%: 2.7 % (ref 0.0–7.0)
HCT: 36.6 % (ref 34.8–46.6)
HEMOGLOBIN: 11.9 g/dL (ref 11.6–15.9)
LYMPH#: 2 10*3/uL (ref 0.9–3.3)
LYMPH%: 31.5 % (ref 14.0–49.7)
MCH: 30.4 pg (ref 25.1–34.0)
MCHC: 32.5 g/dL (ref 31.5–36.0)
MCV: 93.6 fL (ref 79.5–101.0)
MONO#: 0.6 10*3/uL (ref 0.1–0.9)
MONO%: 8.9 % (ref 0.0–14.0)
NEUT%: 56.6 % (ref 38.4–76.8)
NEUTROS ABS: 3.6 10*3/uL (ref 1.5–6.5)
Platelets: 289 10*3/uL (ref 145–400)
RBC: 3.91 10*6/uL (ref 3.70–5.45)
RDW: 14.1 % (ref 11.2–14.5)
WBC: 6.3 10*3/uL (ref 3.9–10.3)
nRBC: 0 % (ref 0–0)

## 2013-04-08 MED ORDER — ACETAMINOPHEN 325 MG PO TABS
ORAL_TABLET | ORAL | Status: AC
  Filled 2013-04-08: qty 2

## 2013-04-08 MED ORDER — HEPARIN SOD (PORK) LOCK FLUSH 100 UNIT/ML IV SOLN
500.0000 [IU] | Freq: Once | INTRAVENOUS | Status: AC | PRN
  Administered 2013-04-08: 500 [IU]
  Filled 2013-04-08: qty 5

## 2013-04-08 MED ORDER — ACETAMINOPHEN 325 MG PO TABS
650.0000 mg | ORAL_TABLET | Freq: Once | ORAL | Status: AC
  Administered 2013-04-08: 650 mg via ORAL

## 2013-04-08 MED ORDER — TRASTUZUMAB CHEMO INJECTION 440 MG
6.0000 mg/kg | Freq: Once | INTRAVENOUS | Status: AC
  Administered 2013-04-08: 483 mg via INTRAVENOUS
  Filled 2013-04-08: qty 23

## 2013-04-08 MED ORDER — SODIUM CHLORIDE 0.9 % IV SOLN
Freq: Once | INTRAVENOUS | Status: AC
  Administered 2013-04-08: 15:00:00 via INTRAVENOUS

## 2013-04-08 MED ORDER — SODIUM CHLORIDE 0.9 % IJ SOLN
10.0000 mL | INTRAMUSCULAR | Status: DC | PRN
  Administered 2013-04-08: 10 mL
  Filled 2013-04-08: qty 10

## 2013-04-08 NOTE — Progress Notes (Signed)
   Weekly Management Note:  outpatient Current Dose:  10 Gy  Projected Dose: 60 Gy   Narrative:  The patient presents for routine under treatment assessment.  CBCT/MVCT images/Port film x-rays were reviewed.  The chart was checked. No complaints  Physical Findings:  temperature is 97.9 F (36.6 C). Her blood pressure is 114/73 and her pulse is 73.   No skin changes so far  Impression:  The patient is tolerating radiotherapy.  Plan:  Continue radiotherapy as planned.   ________________________________   Eppie Gibson, M.D.

## 2013-04-08 NOTE — Progress Notes (Signed)
Chaplain made follow-up visit. Pt stated that she's finished "the hard part" of her treatment. She said that now will be doing radiation and an easier chemo that will not make her sick. Pt stated that she feels "wonderful" to have finished and is looking forward to August, when she will be totally finished. Chaplain practiced caring presence and celebrated with pt.

## 2013-04-08 NOTE — Progress Notes (Signed)
Ms. Plazola has received 5 fractions to her right breast.  She denies any pain nor redness of her treatment field. Accompanied by her sister, Hoyle Sauer

## 2013-04-08 NOTE — Progress Notes (Signed)
Patient declines Benadryl premed. LVEF =55-60 % per last ECHO

## 2013-04-08 NOTE — Progress Notes (Signed)
[facility]  Telephone:(336) [phone] Fax:(336) [phone]  OFFICE PROGRESS NOTE  ID: [name]   DOB: [birth year]  MR#: [id number]  EOF#:[id number]   PCP: Haywood Pao, MD SU: [name], M.D.   DIAGNOSIS:  Angela Rosario is a 73 y.o. female diagnosed in 08/2012 with stage I, invasive ductal carcinoma of the right breast, estrogen receptor positive, progesterone receptor positive, HER-2/neu positive with Ki-67 of 45%.    PRIOR THERAPY: 1.  Angela Rosario herself palpated a mass approximately 6 months after her annual mammogram.  Diagnostic mammogram on 09/11/2012 showed a 7 mm mass at the 12 o'clock position. Ultrasound showed the mass to be 7 mm at the 12 o'clock position.  Right breast needle core biopsy of the 12 o'clock position on 09/12/2012  showed invasive mammary carcinoma with lobular features, grade 2, estrogen receptor 100% positive, progesterone receptor 100% positive, Ki-67 45%, HER-2/neu by CISH showed amplification with ratio 2.96.  Bilateral breast MRI on 09/18/2012 showed a 1.5 x 1.7 x 1.5 cm irregular mass in the right breast with no mass or abnormal enhancements in the left breast or lymph nodes (clinical stage I, T1 N0).   2.  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  3.  Adjuvant chemotherapy consisting of [place] (Taxotere/Carboplatin/Herceptin) originally scheduled to start on 11/19/2012 however the patient suffered a fall on 11/06/2012 and subsequently underwent arm fracture repair  surgery. She received three weeks of Herceptin therapy that started on 11/19/2012 and adjuvant chemotherapy consisting of [place] started on 12/10/2012.  She completed therapy [place] therapy on 02/18/13 and began adjuvant every 3 week Herceptin on 02/25/13.     CURRENT THERAPY:  Herceptin every three weeks.  Cycle 3 today  INTERVAL HISTORY: Angela Rosario is a 73 y.o. female who returns for evaluation prior to adjuvant ever three week herceptin.  She is doing well today.  She also continues to have numbness in her fingertips and her toes.  The numbness is slightly improved in her fingertips, however it is unchanged in her toes.   Otherwise, she denies fevers, chills, pain, nausea, vomiting, constipation, skin changes, PND, orthopnea, DOE, swelling, or any further concerns.   MEDICAL HISTORY: Past Medical History  Diagnosis Date  . Syncope   . Diastolic dysfunction   . Hyperlipidemia   . Episode of dizziness     Mild episodes  . Orthostatic hypotension     Some episodes  . Arthritis     oa  . Stress incontinence, female     wears pads  . AC (acromioclavicular) joint bone spurs     bone spurs in neck  . 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 Diabetes mellitus     niddm; last A1C 6.2  . Hypertension   . Wears glasses   . Breast cancer   . Skin cancer     multiple sites (neck, nose, etc.)    ALLERGIES:   Allergies  Allergen Reactions  . Oxycodone Other (See Comments)    hallucinations    MEDICATIONS:  Current Outpatient Prescriptions  Medication Sig Dispense Refill  . Alum & Mag Hydroxide-Simeth (MAGIC MOUTHWASH W/LIDOCAINE) SOLN Take 5 mLs by mouth 4 (four) times daily as needed. Swish and spit, do not swallow  100 mL  4  . B Complex-C (SUPER B COMPLEX PO) Take 1 tablet by mouth every morning.      . carvedilol (COREG) 6.25 MG tablet Take 1 tablet (6.25 mg total) by mouth 2 (two) times daily with a meal.  180 tablet  3  . gabapentin (NEURONTIN) 100 MG  capsule TAKE (1) CAPSULE THREE TIMES DAILY.  90 capsule  6  . glyBURIDE-metformin (GLUCOVANCE) 2.5-500 MG per tablet Take 1 tablet by mouth daily with breakfast.       . lidocaine-prilocaine (EMLA) cream Apply topically as needed.  30 g  1  . lovastatin (MEVACOR) 10 MG tablet Take 10 mg by mouth at bedtime.       . Multiple Vitamin (MULTIVITAMIN) capsule Take 1 capsule by mouth daily.      Marland Kitchen tretinoin (RETIN-A) 0.025 % cream Apply topically at bedtime.      Marland Kitchen UNABLE TO FIND Rx: L8015-Post Mastectomy Camisole (Quantity: 2); L8000-Post Surgical Bras (Quantity: 6) Dx: 174.9 Right partial mastectomy  1 each  0  . 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  1 Units  0   No current facility-administered medications for this visit.   Facility-Administered Medications Ordered in Other Visits  Medication Dose Route Frequency Provider Last Rate Last Dose  . gentamicin (GARAMYCIN) 160 mg in dextrose 5 % 50 mL IVPB  160 mg Intravenous 30 min Pre-Op Franchot Gallo, MD        SURGICAL HISTORY:  Past Surgical History  Procedure Laterality Date  . Other surgical history      Hysterectomy  . Cholecystectomy  1992  . Abdominal hysterectomy  1985    1 ovary removed  . Total hip arthroplasty  02/23/2011    Procedure: TOTAL HIP ARTHROPLASTY;  Surgeon: Dione Plover Aluisio;  Location: [facility];  Service: Orthopedics;  Laterality: Left;  . Pubovaginal sling  02/03/2012    Procedure: Gaynelle Arabian;  Surgeon: Franchot Gallo, MD;  Location: [facility];  Service: Urology;  Laterality: N/A;  1 HR  LYNX SLING  . Tonsillectomy    . Breast lumpectomy with needle localization and axillary sentinel lymph node bx Right 10/04/2012    Procedure: RIGHT BREAST LUMPECTOMY WITH NEEDLE LOCALIZATION AND AXILLARY SENTINEL LYMPH NODE BX;  Surgeon: [name], MD;  Location: [place];  Service: General;  Laterality: Right;  . Portacath placement Left 10/04/2012     Procedure: INSERTION PORT-A-CATH;  Surgeon: [name], MD;  Location: [place];  Service: General;  Laterality: Left;  . Orif wrist fracture Left 11/06/2012    Procedure: LEFT OPEN REDUCTION INTERNAL FIXATION (ORIF) DISTAL RADIUS WRIST FRACTURE;  Surgeon: Tennis Must, MD;  Location: [place];  Service: Orthopedics;  Laterality: Left;    REVIEW OF SYSTEMS:  A 10 point review of systems was completed except as noted above.   PHYSICAL EXAMINATION: Blood pressure 132/76, pulse 76, temperature 97.7 F (36.5 C), temperature source Oral, resp. rate 18, height '5\' 6"'  (1.676 m), weight 182 lb 3.2 oz (82.645 kg). Body mass index is 29.42 kg/(m^2). GENERAL: Patient is a well appearing female in no acute distress HEENT:  Sclerae anicteric.  Oropharynx clear and moist. No ulcerations or evidence of oropharyngeal candidiasis. Neck is supple.  NODES:  No  cervical, supraclavicular, or axillary lymphadenopathy palpated.  BREAST EXAM:  Deferred. LUNGS:  Clear to auscultation bilaterally.  No wheezes or rhonchi. HEART:  Regular rate and rhythm. No murmur appreciated. ABDOMEN:  Soft, nontender.  Positive, normoactive bowel sounds. No organomegaly palpated. MSK:  No focal spinal tenderness to palpation. Full range of motion bilaterally in the upper extremities. EXTREMITIES:  No peripheral edema.   SKIN:  Clear with no obvious rashes or skin changes. No nail dyscrasia. NEURO:  Nonfocal. Well oriented.  Appropriate affect.  ECOG PERFORMANCE STATUS: 1 - Symptomatic but completely ambulatory  LABORATORY DATA: Lab Results  Component Value Date   WBC 6.3 04/08/2013   HGB 11.9 04/08/2013   HCT 36.6 04/08/2013   MCV 93.6 04/08/2013   PLT 289 04/08/2013      Chemistry      Component Value Date/Time   NA 144 03/18/2013 1322   NA 135 11/01/2012 1350   K 3.9 03/18/2013 1322   K 4.2 11/01/2012 1350   CL 96 11/01/2012 1350   CO2 26 03/18/2013 1322   CO2 26 11/01/2012 1350    BUN 10.1 03/18/2013 1322   BUN 10 11/01/2012 1350   CREATININE 0.7 03/18/2013 1322   CREATININE 0.64 11/01/2012 1350      Component Value Date/Time   CALCIUM 9.7 03/18/2013 1322   CALCIUM 9.7 11/01/2012 1350   ALKPHOS 110 03/18/2013 1322   ALKPHOS 77 02/17/2011 1045   AST 18 03/18/2013 1322   AST 26 02/17/2011 1045   ALT 20 03/18/2013 1322   ALT 36* 02/17/2011 1045   BILITOT 0.39 03/18/2013 1322   BILITOT 0.5 02/17/2011 1045       RADIOGRAPHIC STUDIES: No results found.   ASSESSMENT/plan:  SUHAYLAH WAMPOLE is a 73 y.o. female:  1.  Status post right breast lumpectomy with right axillary sentinel lymph node biopsy on 10/04/2012 for a stage I, pT1c, pN0, MX, 1.6 cm invasive ductal carcinoma, grade 2 with associated intermediate grade ductal carcinoma in situ, tumor involved the dermis and lymphovascular invasion was identified, close to anterior margin, other margins were negative, estrogen receptor 100% positive, progesterone receptor 100% positive, Ki-67 45%, HER-2/neu amplified, with 0/5 metastatic right axillary lymph nodes.  2.  S/P Adjuvant chemotherapy consisting of [place] (Taxotere/Carboplatin/Herceptin) originally scheduled to start on 11/19/2012 however the patient suffered a fall on 11/06/2012 and subsequently underwent arm fracture repair surgery. She received three weeks of Herceptin therapy that started on 11/19/2012 and adjuvant chemotherapy consisting of [place] started on 12/10/2012 through 02/11/13 X 4 CYCLES.  3. Adjuvant Herceptin every 3 weeks beginning on 02/25/13, here for cycle 3 today. Last echocardiogram on 02/13/14, will need another in March. She follows up with Dr. Osker Mason every 3 monhts  4. Receiving adjuvant radiation therapy with Dr. Isidore Moos beginning 04/01/13. She will complete this End of March/beginning of April.  5. Followup: Patient will be seen back in 3 weeks for office visit, bloodwork and cycle 4 of Herceptin.  All questions answered.  Ms. Rooke and her sister Hoyle Sauer were  encouraged to contact us in the interim with any questions, concerns, or problems.  I spent 20 minutes counseling the patient face to face.  The total time spent in the appointment was 30 minutes.  Marcy Panning, MD Medical/Oncology [facility] [phone] (beeper) [phone] (Office)  04/08/2013, 2:22 PM

## 2013-04-08 NOTE — Patient Instructions (Signed)
[facility] Discharge Instructions for Patients Receiving Chemotherapy  Today you received the following chemotherapy agent: Herceptin   To help prevent nausea and vomiting after your treatment, we encourage you to take your nausea medication if needed. If you develop nausea and vomiting that is not controlled by your nausea medication, call the clinic.   BELOW ARE SYMPTOMS THAT SHOULD BE REPORTED IMMEDIATELY:  *FEVER GREATER THAN 100.5 F  *CHILLS WITH OR WITHOUT FEVER  NAUSEA AND VOMITING THAT IS NOT CONTROLLED WITH YOUR NAUSEA MEDICATION  *UNUSUAL SHORTNESS OF BREATH  *UNUSUAL BRUISING OR BLEEDING  TENDERNESS IN MOUTH AND THROAT WITH OR WITHOUT PRESENCE OF ULCERS  *URINARY PROBLEMS  *BOWEL PROBLEMS  UNUSUAL RASH Items with * indicate a potential emergency and should be followed up as soon as possible.  Feel free to call the clinic should you have any questions or concerns. The clinic phone number is [phone].  It has been a pleasure to serve you today!

## 2013-04-09 ENCOUNTER — Telehealth: Payer: Self-pay | Admitting: Oncology

## 2013-04-09 ENCOUNTER — Ambulatory Visit
Admission: RE | Admit: 2013-04-09 | Discharge: 2013-04-09 | Disposition: A | Discharge status: Home / Self Care | Payer: Medicare Other | Source: Ambulatory Visit | Attending: Radiation Oncology | Admitting: Radiation Oncology

## 2013-04-09 ENCOUNTER — Telehealth: Payer: Self-pay | Admitting: *Deleted

## 2013-04-09 NOTE — Telephone Encounter (Signed)
Per staff message and POF I have scheduled appts.  JMW  

## 2013-04-10 ENCOUNTER — Ambulatory Visit
Admission: RE | Admit: 2013-04-10 | Discharge: 2013-04-10 | Disposition: A | Discharge status: Home / Self Care | Payer: Medicare Other | Source: Ambulatory Visit | Attending: Radiation Oncology | Admitting: Radiation Oncology

## 2013-04-11 ENCOUNTER — Ambulatory Visit
Admission: RE | Admit: 2013-04-11 | Discharge: 2013-04-11 | Disposition: A | Discharge status: Home / Self Care | Payer: Medicare Other | Source: Ambulatory Visit | Attending: Radiation Oncology | Admitting: Radiation Oncology

## 2013-04-12 ENCOUNTER — Ambulatory Visit
Admission: RE | Admit: 2013-04-12 | Discharge: 2013-04-12 | Disposition: A | Discharge status: Home / Self Care | Payer: Medicare Other | Source: Ambulatory Visit | Attending: Radiation Oncology | Admitting: Radiation Oncology

## 2013-04-15 ENCOUNTER — Ambulatory Visit
Admission: RE | Admit: 2013-04-15 | Discharge: 2013-04-15 | Disposition: A | Discharge status: Home / Self Care | Payer: Medicare Other | Source: Ambulatory Visit | Attending: Radiation Oncology | Admitting: Radiation Oncology

## 2013-04-15 ENCOUNTER — Encounter: Payer: Self-pay | Admitting: Radiation Oncology

## 2013-04-15 VITALS — BP 127/74 | HR 78 | Temp 97.9°F | Resp 20 | Wt 181.5 lb

## 2013-04-15 DIAGNOSIS — C50119 Malignant neoplasm of central portion of unspecified female breast: Secondary | ICD-10-CM

## 2013-04-15 NOTE — Progress Notes (Signed)
Weekly Management Note:  Site: Right breast Current Dose:  2000  cGy Projected Dose: 5000  cGy  Narrative: The patient is seen today for routine under treatment assessment. CBCT/MVCT images/port films were reviewed. The chart was reviewed.   She is without complaints today. She uses Radioplex gel.  Physical Examination:  Filed Vitals:   04/15/13 1241  BP: 127/74  Pulse: 78  Temp: 97.9 F (36.6 C)  Resp: 20  .  Weight: 181 lb 8 oz (82.328 kg). Mild erythema the skin with no areas of desquamation.  Impression: Tolerating radiation therapy well.  Plan: Continue radiation therapy as planned.

## 2013-04-15 NOTE — Progress Notes (Signed)
Pt denies pain, fatigue, loss of appetite. She is applying Radiaplex to right breast treatment area. 

## 2013-04-16 ENCOUNTER — Ambulatory Visit
Admission: RE | Admit: 2013-04-16 | Discharge: 2013-04-16 | Disposition: A | Discharge status: Home / Self Care | Payer: Medicare Other | Source: Ambulatory Visit | Attending: Radiation Oncology | Admitting: Radiation Oncology

## 2013-04-16 NOTE — Addendum Note (Signed)
Encounter addended by: Eppie Gibson, MD on: 04/16/2013  8:47 AM<BR>     Documentation filed: Notes Section

## 2013-04-17 ENCOUNTER — Ambulatory Visit
Admission: RE | Admit: 2013-04-17 | Discharge: 2013-04-17 | Disposition: A | Discharge status: Home / Self Care | Payer: Medicare Other | Source: Ambulatory Visit | Attending: Radiation Oncology | Admitting: Radiation Oncology

## 2013-04-18 ENCOUNTER — Ambulatory Visit
Admission: RE | Admit: 2013-04-18 | Discharge: 2013-04-18 | Disposition: A | Discharge status: Home / Self Care | Payer: Medicare Other | Source: Ambulatory Visit | Attending: Radiation Oncology | Admitting: Radiation Oncology

## 2013-04-19 ENCOUNTER — Ambulatory Visit
Admission: RE | Admit: 2013-04-19 | Discharge: 2013-04-19 | Disposition: A | Discharge status: Home / Self Care | Payer: Medicare Other | Source: Ambulatory Visit | Attending: Radiation Oncology | Admitting: Radiation Oncology

## 2013-04-22 ENCOUNTER — Ambulatory Visit
Admission: RE | Admit: 2013-04-22 | Discharge: 2013-04-22 | Disposition: A | Discharge status: Home / Self Care | Payer: Medicare Other | Source: Ambulatory Visit | Attending: Radiation Oncology | Admitting: Radiation Oncology

## 2013-04-22 ENCOUNTER — Encounter: Payer: Self-pay | Admitting: Radiation Oncology

## 2013-04-22 ENCOUNTER — Inpatient Hospital Stay
Admission: RE | Admit: 2013-04-22 | Discharge: 2013-04-22 | Disposition: A | Discharge status: Home / Self Care | Payer: Medicare Other | Source: Ambulatory Visit | Attending: Radiation Oncology | Admitting: Radiation Oncology

## 2013-04-22 VITALS — BP 124/78 | HR 85 | Temp 98.3°F | Resp 20 | Wt 181.3 lb

## 2013-04-22 DIAGNOSIS — C50119 Malignant neoplasm of central portion of unspecified female breast: Secondary | ICD-10-CM

## 2013-04-22 NOTE — Addendum Note (Signed)
Encounter addended by: Deirdre Evener, RN on: 04/22/2013  5:49 PM<BR>     Documentation filed: Orders

## 2013-04-22 NOTE — Progress Notes (Signed)
Weekly rad txs rt breast, 15 txs completed, slight erythema, tender and starting to itch states patient, using radiaplex 3x day,gave another tube, appetite good, fatigue comes and goes  12:08 PM

## 2013-04-22 NOTE — Progress Notes (Signed)
   Weekly Management Note:  Outpatient Current Dose:  30 Gy  Projected Dose: 60 Gy   Narrative:  The patient presents for routine under treatment assessment.  CBCT/MVCT images/Port film x-rays were reviewed.  The chart was checked. She is doing well, but having more itching in the right axilla.  Physical Findings:  weight is 181 lb 4.8 oz (82.237 kg). Her oral temperature is 98.3 F (36.8 C). Her blood pressure is 124/78 and her pulse is 85. Her respiration is 20.  Right breast is pink, skin is intact  Impression:  The patient is tolerating radiotherapy.  Plan:  Continue radiotherapy as planned. Recommended 1% hydrocortisone cream over her breast TID, alternating with radiaplex.  ________________________________   Eppie Gibson, M.D.

## 2013-04-22 NOTE — Addendum Note (Signed)
Encounter addended by: Deirdre Evener, RN on: 04/22/2013  2:18 PM<BR>     Documentation filed: Orders

## 2013-04-22 NOTE — Addendum Note (Signed)
Encounter addended by: Deirdre Evener, RN on: 04/22/2013  1:59 PM<BR>     Documentation filed: Orders

## 2013-04-22 NOTE — Addendum Note (Signed)
Encounter addended by: Deirdre Evener, RN on: 04/22/2013  2:12 PM<BR>     Documentation filed: Orders

## 2013-04-22 NOTE — Addendum Note (Signed)
Encounter addended by: Rebecca Eaton, RN on: 04/22/2013  1:39 PM<BR>     Documentation filed: Inpatient Document Flowsheet

## 2013-04-23 ENCOUNTER — Ambulatory Visit: Payer: Medicare Other

## 2013-04-23 ENCOUNTER — Ambulatory Visit
Admission: RE | Admit: 2013-04-23 | Discharge: 2013-04-23 | Disposition: A | Discharge status: Home / Self Care | Payer: Medicare Other | Source: Ambulatory Visit | Attending: Radiation Oncology | Admitting: Radiation Oncology

## 2013-04-23 ENCOUNTER — Encounter: Payer: Self-pay | Admitting: Radiation Oncology

## 2013-04-23 NOTE — Progress Notes (Signed)
Simulation verification note: The patient underwent simulation verification today for treatment to her right breast. Her isocenter is in good position and the multileaf collimators contoured the intended treatment volume appropriately.

## 2013-04-24 ENCOUNTER — Ambulatory Visit: Payer: Medicare Other

## 2013-04-24 ENCOUNTER — Ambulatory Visit
Admission: RE | Admit: 2013-04-24 | Discharge: 2013-04-24 | Disposition: A | Discharge status: Home / Self Care | Payer: Medicare Other | Source: Ambulatory Visit | Attending: Radiation Oncology | Admitting: Radiation Oncology

## 2013-04-24 DIAGNOSIS — C50119 Malignant neoplasm of central portion of unspecified female breast: Secondary | ICD-10-CM

## 2013-04-24 MED ORDER — RADIAPLEXRX EX GEL
Freq: Once | CUTANEOUS | Status: AC
  Administered 2013-04-24: 12:00:00 via TOPICAL

## 2013-04-25 ENCOUNTER — Ambulatory Visit: Payer: Medicare Other

## 2013-04-25 ENCOUNTER — Ambulatory Visit
Admission: RE | Admit: 2013-04-25 | Discharge: 2013-04-25 | Disposition: A | Discharge status: Home / Self Care | Payer: Medicare Other | Source: Ambulatory Visit | Attending: Radiation Oncology | Admitting: Radiation Oncology

## 2013-04-26 ENCOUNTER — Ambulatory Visit: Payer: Medicare Other

## 2013-04-29 ENCOUNTER — Telehealth: Payer: Self-pay

## 2013-04-29 ENCOUNTER — Ambulatory Visit: Payer: Medicare Other

## 2013-04-29 ENCOUNTER — Other Ambulatory Visit (HOSPITAL_BASED_OUTPATIENT_CLINIC_OR_DEPARTMENT_OTHER): Payer: Medicare Other

## 2013-04-29 ENCOUNTER — Ambulatory Visit
Admission: RE | Admit: 2013-04-29 | Discharge: 2013-04-29 | Disposition: A | Discharge status: Home / Self Care | Payer: Medicare Other | Source: Ambulatory Visit | Attending: Radiation Oncology | Admitting: Radiation Oncology

## 2013-04-29 ENCOUNTER — Other Ambulatory Visit: Payer: Self-pay | Admitting: Oncology

## 2013-04-29 ENCOUNTER — Encounter: Payer: Self-pay | Admitting: Oncology

## 2013-04-29 ENCOUNTER — Encounter: Payer: Self-pay | Admitting: Radiation Oncology

## 2013-04-29 ENCOUNTER — Ambulatory Visit (HOSPITAL_BASED_OUTPATIENT_CLINIC_OR_DEPARTMENT_OTHER): Payer: Medicare Other

## 2013-04-29 ENCOUNTER — Ambulatory Visit (HOSPITAL_BASED_OUTPATIENT_CLINIC_OR_DEPARTMENT_OTHER): Payer: Medicare Other | Admitting: Oncology

## 2013-04-29 VITALS — BP 129/78 | HR 78 | Temp 98.0°F | Resp 18 | Ht 66.0 in | Wt 182.1 lb

## 2013-04-29 DIAGNOSIS — C50119 Malignant neoplasm of central portion of unspecified female breast: Secondary | ICD-10-CM

## 2013-04-29 DIAGNOSIS — G629 Polyneuropathy, unspecified: Secondary | ICD-10-CM

## 2013-04-29 DIAGNOSIS — Z170 Estrogen receptor positive status [ER+]: Secondary | ICD-10-CM

## 2013-04-29 DIAGNOSIS — Z5112 Encounter for antineoplastic immunotherapy: Secondary | ICD-10-CM

## 2013-04-29 DIAGNOSIS — C50111 Malignant neoplasm of central portion of right female breast: Secondary | ICD-10-CM

## 2013-04-29 DIAGNOSIS — E119 Type 2 diabetes mellitus without complications: Secondary | ICD-10-CM

## 2013-04-29 DIAGNOSIS — C50919 Malignant neoplasm of unspecified site of unspecified female breast: Secondary | ICD-10-CM

## 2013-04-29 DIAGNOSIS — G589 Mononeuropathy, unspecified: Secondary | ICD-10-CM

## 2013-04-29 LAB — CBC WITH DIFFERENTIAL/PLATELET
BASO%: 0.3 % (ref 0.0–2.0)
BASOS ABS: 0 10*3/uL (ref 0.0–0.1)
EOS ABS: 0.2 10*3/uL (ref 0.0–0.5)
EOS%: 2.8 % (ref 0.0–7.0)
HCT: 38.7 % (ref 34.8–46.6)
HEMOGLOBIN: 13 g/dL (ref 11.6–15.9)
LYMPH%: 22.9 % (ref 14.0–49.7)
MCH: 30.4 pg (ref 25.1–34.0)
MCHC: 33.6 g/dL (ref 31.5–36.0)
MCV: 90.6 fL (ref 79.5–101.0)
MONO#: 0.5 10*3/uL (ref 0.1–0.9)
MONO%: 7.1 % (ref 0.0–14.0)
NEUT%: 66.9 % (ref 38.4–76.8)
NEUTROS ABS: 5.1 10*3/uL (ref 1.5–6.5)
NRBC: 0 % (ref 0–0)
Platelets: 298 10*3/uL (ref 145–400)
RBC: 4.27 10*6/uL (ref 3.70–5.45)
RDW: 13.6 % (ref 11.2–14.5)
WBC: 7.6 10*3/uL (ref 3.9–10.3)
lymph#: 1.7 10*3/uL (ref 0.9–3.3)

## 2013-04-29 LAB — COMPREHENSIVE METABOLIC PANEL (CC13)
ALK PHOS: 90 U/L (ref 40–150)
ALT: 14 U/L (ref 0–55)
AST: 16 U/L (ref 5–34)
Albumin: 4.2 g/dL (ref 3.5–5.0)
Anion Gap: 11 mEq/L (ref 3–11)
BUN: 12.8 mg/dL (ref 7.0–26.0)
CALCIUM: 9.9 mg/dL (ref 8.4–10.4)
CHLORIDE: 108 meq/L (ref 98–109)
CO2: 25 mEq/L (ref 22–29)
CREATININE: 0.7 mg/dL (ref 0.6–1.1)
Glucose: 89 mg/dl (ref 70–140)
Potassium: 3.5 mEq/L (ref 3.5–5.1)
Sodium: 144 mEq/L (ref 136–145)
Total Bilirubin: 0.5 mg/dL (ref 0.20–1.20)
Total Protein: 6.6 g/dL (ref 6.4–8.3)

## 2013-04-29 MED ORDER — ACETAMINOPHEN 325 MG PO TABS
ORAL_TABLET | ORAL | Status: AC
  Filled 2013-04-29: qty 2

## 2013-04-29 MED ORDER — SODIUM CHLORIDE 0.9 % IJ SOLN
10.0000 mL | INTRAMUSCULAR | Status: DC | PRN
  Administered 2013-04-29: 10 mL
  Filled 2013-04-29: qty 10

## 2013-04-29 MED ORDER — ACETAMINOPHEN 325 MG PO TABS
650.0000 mg | ORAL_TABLET | Freq: Once | ORAL | Status: AC
  Administered 2013-04-29: 650 mg via ORAL

## 2013-04-29 MED ORDER — BIAFINE EX EMUL
CUTANEOUS | Status: DC | PRN
  Administered 2013-04-29: 14:00:00 via TOPICAL

## 2013-04-29 MED ORDER — SODIUM CHLORIDE 0.9 % IV SOLN
6.0000 mg/kg | Freq: Once | INTRAVENOUS | Status: AC
  Administered 2013-04-29: 483 mg via INTRAVENOUS
  Filled 2013-04-29: qty 23

## 2013-04-29 MED ORDER — SODIUM CHLORIDE 0.9 % IV SOLN
Freq: Once | INTRAVENOUS | Status: AC
  Administered 2013-04-29: 15:00:00 via INTRAVENOUS

## 2013-04-29 MED ORDER — HEPARIN SOD (PORK) LOCK FLUSH 100 UNIT/ML IV SOLN
500.0000 [IU] | Freq: Once | INTRAVENOUS | Status: AC | PRN
  Administered 2013-04-29: 500 [IU]
  Filled 2013-04-29: qty 5

## 2013-04-29 NOTE — Telephone Encounter (Signed)
Per Dresser,  Pt is due for ECHO.  Routed to [facility]

## 2013-04-29 NOTE — Progress Notes (Signed)
Discharged, ambulatory, alone in no distress.  Called family for ride home.

## 2013-04-29 NOTE — Progress Notes (Signed)
   Weekly Management Note:  outpatient Current Dose:  38 Gy  Projected Dose: 60 Gy   Narrative:  The patient presents for routine under treatment assessment.  CBCT/MVCT images/Port film x-rays were reviewed.  The chart was checked. Skin feels more irritated.    Physical Findings: Vitals with Age-Percentiles 04/29/2013  Length 100.7 cm  Systolic 121  Diastolic 78  Pulse 78  Respiration 18  Weight 82.6 kg  BMI 29.5  VISIT REPORT    NAD, bright erythema over right breast with raised macular papular rash c/w dermatitis from RT  Impression:  The patient is tolerating radiotherapy.  Plan:  Continue radiotherapy as planned. Continue hydrocortisone cream, replace Radiaplex with Biafine.  Try hydrogel pads to soothe as well.  ________________________________   Eppie Gibson, M.D.

## 2013-04-29 NOTE — Progress Notes (Signed)
[place]  [phone] [phone]  OFFICE PROGRESS NOTE  ID: Angela Rosario   DOB: Apr 01, 1940  MR#: [id number]  NUU#:[id number]   PCP: Haywood Pao, MD SU: [name], M.D.   DIAGNOSIS:  Angela Rosario is a 73 y.o. female diagnosed in 08/2012 with stage I, invasive ductal carcinoma of the right breast, estrogen receptor positive, progesterone receptor positive, HER-2/neu positive with Ki-67 of 45%.    Angela THERAPY:  1.  Wannetta Rosario herself palpated a mass approximately 6 months after her annual mammogram.  Diagnostic mammogram on 09/11/2012 showed a 7 mm mass at the 12 o'clock position. Ultrasound showed the mass to be 7 mm at the 12 o'clock position.  Right breast needle core biopsy of the 12 o'clock position on 09/12/2012  showed invasive mammary carcinoma with lobular features, grade 2, estrogen receptor 100% positive, progesterone receptor 100% positive, Ki-67 45%, HER-2/neu by CISH showed amplification with ratio 2.96.  Bilateral breast MRI on 09/18/2012 showed a 1.5 x 1.7 x 1.5 cm irregular mass in the right breast with no mass or abnormal enhancements in the left breast or lymph nodes (clinical stage I, T1 N0).   2.  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  3.  S/P Adjuvant chemotherapy consisting of [place] (Taxotere/Carboplatin/Herceptin) originally scheduled to start on 11/19/2012 however the patient suffered a fall on 11/06/2012 and subsequently underwent arm fracture repair  surgery. She received three weeks of Herceptin therapy that started on 11/19/2012 and adjuvant chemotherapy consisting of [place] started on 12/10/2012.  She completed therapy [place] therapy on 02/18/13   4.  Receiving adjuvant every 3 week Herceptin on 02/25/13.     CURRENT THERAPY:  Herceptin every three weeks.  Cycle 4 today  INTERVAL HISTORY: Angela Rosario is a 73 y.o. female who returns for followup visit Angela to cycle 4 of Herceptin. Thus far she is tolerating it well. She does need an echocardiogram performed sometime this month. I will get her set up for this. She will also need to be seen by cardiology. She does feel weak tired and fatigued sometimes. I do think she still has some residual effects of the Taxotere and carboplatinum. She still has some peripheral paresthesias. She is on gabapentin as well as super B complex. We discussed the possibility of referring her to neurology. I do think that may be important if the neuropathy is don't resolve. Of note patient is also a diabetic so this may be contributing to the neuropathy. Patient will think about it. She otherwise has no nausea or vomiting no fevers chills. Remainder of the 10 point review of systems is negative  MEDICAL HISTORY: Past Medical History  Diagnosis Date  . Syncope   . Diastolic dysfunction   . Hyperlipidemia   . Episode of dizziness     Mild episodes  . Orthostatic hypotension     Some episodes  . Arthritis     oa  . Stress incontinence, female     wears pads  . AC (acromioclavicular) joint bone spurs     bone spurs in neck  .  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 Diabetes mellitus     niddm; last A1C 6.2  . Hypertension   . Wears glasses   . Breast cancer   . Skin cancer     multiple sites (neck, nose, etc.)    ALLERGIES:   Allergies  Allergen Reactions  . Oxycodone Other (See Comments)    hallucinations    MEDICATIONS:  Current Outpatient Prescriptions  Medication Sig  Dispense Refill  . B Complex-C (SUPER B COMPLEX PO) Take 1 tablet by mouth every morning.      . carvedilol (COREG) 6.25 MG tablet Take 1 tablet (6.25 mg total) by mouth 2 (two) times daily with a meal.  180 tablet  3  . emollient (BIAFINE) cream Apply topically as needed.      . gabapentin (NEURONTIN) 100 MG capsule TAKE (1) CAPSULE THREE TIMES DAILY.  90 capsule  6  . glyBURIDE-metformin (GLUCOVANCE) 2.5-500 MG per tablet Take 1 tablet by mouth daily with breakfast.       . hyaluronate sodium (RADIAPLEXRX) GEL Apply 1 application topically 2 (two) times daily. 2nd tube given 04/19/13      . lidocaine-prilocaine (EMLA) cream Apply topically as needed.  30 g  1  . lovastatin (MEVACOR) 10 MG tablet Take 10 mg by mouth at bedtime.       . Multiple Vitamin (MULTIVITAMIN) capsule Take 1 capsule by mouth daily.      Marland Kitchen tretinoin (RETIN-A) 0.025 % cream Apply topically at bedtime.      Marland Kitchen UNABLE TO FIND Rx: L8015-Post Mastectomy Camisole (Quantity: 2); L8000-Post Surgical Bras (Quantity: 6) Dx: 174.9 Right partial mastectomy  1 each  0  . 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  1 Units  0  . Alum & Mag Hydroxide-Simeth (MAGIC MOUTHWASH W/LIDOCAINE) SOLN Take 5 mLs by mouth 4 (four) times daily as needed. Swish and spit, do not swallow  100 mL  4   No current facility-administered medications for this visit.   Facility-Administered Medications Ordered in Other Visits  Medication Dose Route Frequency Provider Last Rate Last Dose  . gentamicin (GARAMYCIN) 160 mg in dextrose 5 % 50 mL IVPB  160 mg Intravenous 30 min Pre-Op Franchot Gallo, MD        SURGICAL HISTORY:  Past Surgical History  Procedure Laterality Date  . Other surgical history      Hysterectomy  . Cholecystectomy  1992  . Abdominal hysterectomy  1985    1 ovary removed  . Total hip arthroplasty  02/23/2011    Procedure: TOTAL HIP ARTHROPLASTY;  Surgeon: Dione Plover Aluisio;  Location: [facility];  Service:  Orthopedics;  Laterality: Left;  . Pubovaginal sling  02/03/2012    Procedure: Gaynelle Arabian;  Surgeon: Franchot Gallo, MD;  Location: [facility];  Service: Urology;  Laterality: N/A;  1 HR  LYNX SLING  . Tonsillectomy    . Breast lumpectomy with needle localization and axillary sentinel lymph node bx Right 10/04/2012    Procedure: RIGHT BREAST LUMPECTOMY WITH NEEDLE LOCALIZATION AND AXILLARY SENTINEL LYMPH NODE BX;  Surgeon: [name], MD;  Location: [facility];  Service: General;  Laterality: Right;  . Portacath placement Left 10/04/2012    Procedure: INSERTION PORT-A-CATH;  Surgeon: [name], MD;  Location: [facility];  Service: General;  Laterality: Left;  . Orif wrist fracture Left 11/06/2012    Procedure: LEFT OPEN REDUCTION INTERNAL FIXATION (  ORIF) DISTAL RADIUS WRIST FRACTURE;  Surgeon: Tennis Must, MD;  Location: [facility];  Service: Orthopedics;  Laterality: Left;    REVIEW OF SYSTEMS:  A 10 point review of systems was completed except as noted above.   PHYSICAL EXAMINATION: Blood pressure 129/78, pulse 78, temperature 98 F (36.7 C), temperature source Oral, resp. rate 18, height '5\' 6"'  (1.676 m), weight 182 lb 1.6 oz (82.6 kg). Body mass index is 29.41 kg/(m^2). GENERAL: Patient is a well appearing female in no acute distress HEENT:  Sclerae anicteric.  Oropharynx clear and moist. No ulcerations or evidence of oropharyngeal candidiasis. Neck is supple.  NODES:  No cervical, supraclavicular, or axillary lymphadenopathy palpated.  BREAST EXAM:  Deferred. LUNGS:  Clear to auscultation bilaterally.  No wheezes or rhonchi. HEART:  Regular rate and rhythm. No murmur appreciated. ABDOMEN:  Soft, nontender.  Positive, normoactive bowel sounds. No organomegaly palpated. MSK:  No focal spinal tenderness to palpation. Full range of motion bilaterally in the upper extremities. EXTREMITIES:  No peripheral  edema.   SKIN:  Clear with no obvious rashes or skin changes. No nail dyscrasia. NEURO:  Nonfocal. Well oriented.  Appropriate affect.  ECOG PERFORMANCE STATUS: 1 - Symptomatic but completely ambulatory  LABORATORY DATA: Lab Results  Component Value Date   WBC 7.6 04/29/2013   HGB 13.0 04/29/2013   HCT 38.7 04/29/2013   MCV 90.6 04/29/2013   PLT 298 04/29/2013      Chemistry      Component Value Date/Time   NA 141 04/08/2013 1317   NA 135 11/01/2012 1350   K 3.9 04/08/2013 1317   K 4.2 11/01/2012 1350   CL 96 11/01/2012 1350   CO2 25 04/08/2013 1317   CO2 26 11/01/2012 1350   BUN 12.2 04/08/2013 1317   BUN 10 11/01/2012 1350   CREATININE 0.7 04/08/2013 1317   CREATININE 0.64 11/01/2012 1350      Component Value Date/Time   CALCIUM 9.5 04/08/2013 1317   CALCIUM 9.7 11/01/2012 1350   ALKPHOS 93 04/08/2013 1317   ALKPHOS 77 02/17/2011 1045   AST 17 04/08/2013 1317   AST 26 02/17/2011 1045   ALT 14 04/08/2013 1317   ALT 36* 02/17/2011 1045   BILITOT 0.49 04/08/2013 1317   BILITOT 0.5 02/17/2011 1045       RADIOGRAPHIC STUDIES: No results found.   ASSESSMENT/plan:  Angela Rosario is a 73 y.o. female:  1.  Status post right breast lumpectomy with right axillary sentinel lymph node biopsy on 10/04/2012 for a stage I, pT1c, pN0, MX, 1.6 cm invasive ductal carcinoma, grade 2 with associated intermediate grade ductal carcinoma in situ, tumor involved the dermis and lymphovascular invasion was identified, close to anterior margin, other margins were negative, estrogen receptor 100% positive, progesterone receptor 100% positive, Ki-67 45%, HER-2/neu amplified, with 0/5 metastatic right axillary lymph nodes.  2.  S/P Adjuvant chemotherapy consisting of [place] (Taxotere/Carboplatin/Herceptin) originally scheduled to start on 11/19/2012 however the patient suffered a fall on 11/06/2012 and subsequently underwent arm fracture repair surgery. She received three weeks of Herceptin therapy that started on  11/19/2012 and adjuvant chemotherapy consisting of [place] started on 12/10/2012 through 02/11/13 X 4 CYCLES.  3. Adjuvant Herceptin every 3 weeks beginning on 02/25/13, here for cycle 4 today. Last echocardiogram on 02/13/14, we will set her up for echocardiogram. She will also need to be seen by Dr. Jeffie Pollock. Her blood count looks good.  4. Receiving adjuvant radiation therapy with Dr. Isidore Moos  beginning 04/01/13. She will complete this End of March/beginning of April.  5. Followup: Patient will be seen back in 3 weeks for office visit, bloodwork and cycle 5 of Herceptin.  All questions answered.  Ms. Devita and her sister Hoyle Sauer were encouraged to contact us in the interim with any questions, concerns, or problems.  I spent 20 minutes counseling the patient face to face.  The total time spent in the appointment was 30 minutes.  Marcy Panning, MD Medical/Oncology [facility] [phone] (beeper) [phone] (Office)  04/29/2013, 1:18 PM

## 2013-04-29 NOTE — Patient Instructions (Signed)

## 2013-04-29 NOTE — Telephone Encounter (Signed)
Patient will be seeing Dr. Haroldine Laws in follow up and will have an echo

## 2013-04-29 NOTE — Progress Notes (Signed)
Patient asked when she is scheduled for 2D echo.  Reports her last echo was January of this year.  Says she will mention this to provider at next visit.  This nurse will notify provider as well.

## 2013-04-29 NOTE — Progress Notes (Signed)
[employer]ron [employer] representative Note  Diagnosis: Breast Cancer   The patient's CT images from her initial simulation were reviewed to plan her boost treatment to her right breast  lumpectomy cavity.  Measurements were made regarding the size and depth of the surgical bed. The boost to the lumpectomy cavity will be delivered with 9 MeV electrons prescribed to the 95% isodose line.   The isodose plan /electron calc's were reviewed and approved. A custom electron cut-out will be used for her boost field. 10 Gy in 5 fractions will be given.  -----------------------------------  Eppie Gibson, MD

## 2013-04-30 ENCOUNTER — Ambulatory Visit: Payer: Medicare Other

## 2013-04-30 ENCOUNTER — Telehealth: Payer: Self-pay | Admitting: *Deleted

## 2013-04-30 ENCOUNTER — Ambulatory Visit
Admission: RE | Admit: 2013-04-30 | Discharge: 2013-04-30 | Disposition: A | Discharge status: Home / Self Care | Payer: Medicare Other | Source: Ambulatory Visit | Attending: Radiation Oncology | Admitting: Radiation Oncology

## 2013-04-30 NOTE — Telephone Encounter (Signed)
Printed the pof and gv to [facility] for pre cert....td

## 2013-05-01 ENCOUNTER — Ambulatory Visit
Admission: RE | Admit: 2013-05-01 | Discharge: 2013-05-01 | Disposition: A | Discharge status: Home / Self Care | Payer: Medicare Other | Source: Ambulatory Visit | Attending: Radiation Oncology | Admitting: Radiation Oncology

## 2013-05-01 ENCOUNTER — Ambulatory Visit: Payer: Medicare Other

## 2013-05-02 ENCOUNTER — Ambulatory Visit: Payer: Medicare Other

## 2013-05-02 ENCOUNTER — Ambulatory Visit
Admission: RE | Admit: 2013-05-02 | Discharge: 2013-05-02 | Disposition: A | Discharge status: Home / Self Care | Payer: Medicare Other | Source: Ambulatory Visit | Attending: Radiation Oncology | Admitting: Radiation Oncology

## 2013-05-03 ENCOUNTER — Ambulatory Visit
Admission: RE | Admit: 2013-05-03 | Discharge: 2013-05-03 | Disposition: A | Discharge status: Home / Self Care | Payer: Medicare Other | Source: Ambulatory Visit | Attending: Radiation Oncology | Admitting: Radiation Oncology

## 2013-05-03 ENCOUNTER — Ambulatory Visit: Payer: Medicare Other

## 2013-05-06 ENCOUNTER — Encounter: Payer: Self-pay | Admitting: Radiation Oncology

## 2013-05-06 ENCOUNTER — Ambulatory Visit: Admission: RE | Admit: 2013-05-06 | Payer: Medicare Other | Source: Ambulatory Visit

## 2013-05-06 ENCOUNTER — Ambulatory Visit: Payer: Medicare Other

## 2013-05-06 ENCOUNTER — Ambulatory Visit
Admission: RE | Admit: 2013-05-06 | Discharge: 2013-05-06 | Disposition: A | Discharge status: Home / Self Care | Payer: Medicare Other | Source: Ambulatory Visit | Attending: Radiation Oncology | Admitting: Radiation Oncology

## 2013-05-06 VITALS — BP 146/64 | HR 65 | Temp 97.9°F | Ht 66.0 in | Wt 182.9 lb

## 2013-05-06 DIAGNOSIS — C50119 Malignant neoplasm of central portion of unspecified female breast: Secondary | ICD-10-CM

## 2013-05-06 MED ORDER — BIAFINE EX EMUL
CUTANEOUS | Status: DC | PRN
  Administered 2013-05-06: 13:00:00 via TOPICAL

## 2013-05-06 NOTE — Progress Notes (Signed)
   Weekly Management Note:  Outpatient Current Dose:  48 Gy  Projected Dose: 60 Gy   Narrative:  The patient presents for routine under treatment assessment.  CBCT/MVCT images/Port film x-rays were reviewed.  The chart was checked. Biafine helps the pinkness and warmth of her breast, she reports.  Itching better w/ Hydrocortisone   Physical Findings:  height is 5\' 6"  (1.676 m) and weight is 182 lb 14.4 oz (82.963 kg). Her temperature is 97.9 F (36.6 C). Her blood pressure is 146/64 and her pulse is 65.  bright erythema, right breast. Skin intact.  Impression:  The patient is tolerating radiotherapy.  Plan:  Continue radiotherapy as planned.   ________________________________   Eppie Gibson, M.D.

## 2013-05-06 NOTE — Progress Notes (Signed)
Angela Rosario is receiving radiation to her right breast.  Note Bright erythema of the entire field inclusive of her right axilla, but her skin remains intact.  She reports tenderness of this area and is now using Biafine with relief.   She denies any fatigue

## 2013-05-07 ENCOUNTER — Ambulatory Visit: Payer: Medicare Other

## 2013-05-08 ENCOUNTER — Ambulatory Visit: Payer: Medicare Other

## 2013-05-08 ENCOUNTER — Ambulatory Visit
Admission: RE | Admit: 2013-05-08 | Discharge: 2013-05-08 | Disposition: A | Discharge status: Home / Self Care | Payer: Medicare Other | Source: Ambulatory Visit | Attending: Radiation Oncology | Admitting: Radiation Oncology

## 2013-05-09 ENCOUNTER — Ambulatory Visit
Admission: RE | Admit: 2013-05-09 | Discharge: 2013-05-09 | Disposition: A | Discharge status: Home / Self Care | Payer: Medicare Other | Source: Ambulatory Visit | Attending: Radiation Oncology | Admitting: Radiation Oncology

## 2013-05-09 ENCOUNTER — Ambulatory Visit: Payer: Medicare Other

## 2013-05-10 ENCOUNTER — Ambulatory Visit
Admission: RE | Admit: 2013-05-10 | Discharge: 2013-05-10 | Disposition: A | Discharge status: Home / Self Care | Payer: Medicare Other | Source: Ambulatory Visit | Attending: Radiation Oncology | Admitting: Radiation Oncology

## 2013-05-10 ENCOUNTER — Ambulatory Visit: Payer: Medicare Other

## 2013-05-13 ENCOUNTER — Ambulatory Visit: Payer: Medicare Other

## 2013-05-13 ENCOUNTER — Ambulatory Visit
Admission: RE | Admit: 2013-05-13 | Discharge: 2013-05-13 | Disposition: A | Discharge status: Home / Self Care | Payer: Medicare Other | Source: Ambulatory Visit | Attending: Radiation Oncology | Admitting: Radiation Oncology

## 2013-05-13 ENCOUNTER — Encounter: Payer: Self-pay | Admitting: Radiation Oncology

## 2013-05-13 VITALS — BP 129/79 | HR 69 | Temp 97.7°F | Ht 66.0 in | Wt 184.0 lb

## 2013-05-13 DIAGNOSIS — C50119 Malignant neoplasm of central portion of unspecified female breast: Secondary | ICD-10-CM

## 2013-05-13 MED ORDER — BIAFINE EX EMUL
Freq: Two times a day (BID) | CUTANEOUS | Status: DC
  Administered 2013-05-13: 12:00:00 via TOPICAL

## 2013-05-13 NOTE — Progress Notes (Signed)
   Weekly Management Note:  outpatient Current Dose:  58 Gy  Projected Dose: 60 Gy   Narrative:  The patient presents for routine under treatment assessment.  CBCT/MVCT images/Port film x-rays were reviewed.  The chart was checked. Doing well.  Some increase skin irritation. No peeling of skin  Physical Findings:  height is 5\' 6"  (1.676 m) and weight is 184 lb (83.462 kg). Her temperature is 97.7 F (36.5 C). Her blood pressure is 129/79 and her pulse is 69.  Right breast - erythema, bright.  Skin intact.  Impression:  The patient is tolerating radiotherapy.  Plan:  Continue radiotherapy as planned. 1 mo f/u card given.  Advised to try neosporin over any peeling if it develops after completion of RT. Another tube of Biafine given.  ________________________________   Eppie Gibson, M.D.

## 2013-05-13 NOTE — Progress Notes (Signed)
Ms. Frommelt has received 4/5 fractions for the electron boost.  She has bright erythema of her right breast,. But her skin remains intact.  She c/o tenderness in the lower axillary region and  c/o fatigue.  Explained that fatigue may be present up to a month following radiation therapy.

## 2013-05-14 ENCOUNTER — Ambulatory Visit
Admission: RE | Admit: 2013-05-14 | Discharge: 2013-05-14 | Disposition: A | Discharge status: Home / Self Care | Payer: Medicare Other | Source: Ambulatory Visit | Attending: Radiation Oncology | Admitting: Radiation Oncology

## 2013-05-14 ENCOUNTER — Ambulatory Visit: Payer: Medicare Other

## 2013-05-14 ENCOUNTER — Encounter: Payer: Self-pay | Admitting: Radiation Oncology

## 2013-05-19 NOTE — Progress Notes (Addendum)
Hematology and Oncology Follow Up Visit  Angela Rosario [id number] 10-05-1940 73 y.o. 05/21/2013 3:19 PM     Principle Diagnosis:Angela Rosario 73 y.o. 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     Prior Therapy:  1. The patient  palpated a mass approximately 6 months after her annual mammogram. Diagnostic mammogram on 09/11/2012 showed a 7 mm mass at the 12 o'clock position. Ultrasound showed the mass to be 7 mm at the 12 o'clock position. Right breast needle core biopsy of the 12 o'clock position on 09/12/2012 showed invasive mammary carcinoma with lobular features, grade 2, estrogen receptor 100% positive, progesterone receptor 100% positive, Ki-67 45%, HER-2/neu by CISH showed amplification with ratio 2.96. Bilateral breast MRI on 09/18/2012 showed a 1.5 x 1.7 x 1.5 cm irregular mass in the right breast with no mass or abnormal enhancements in the left breast or lymph nodes (clinical stage I, T1 N0).   2. 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   3. Adjuvant chemotherapy consisting of Gamaliel (Taxotere/Carboplatin/Herceptin) originally scheduled to start on 11/19/2012 however the patient suffered a fall on 11/06/2012 and subsequently underwent arm fracture repair surgery. She received three weeks of Herceptin therapy that started on 11/19/2012 and adjuvant chemotherapy consisting of [place] started on 12/10/2012. She completed therapy [place] therapy on 02/18/13 and began adjuvant every 3 week Herceptin on 02/25/13.    4. Adjuvant radiation therapy with Dr. Isidore Moos from 04/01/13 through 05/14/13.    Current therapy:  Herceptin every 3 weeks  Interim History: Angela Rosario 73 y.o. 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  She is here prior to receive Herceptin treatment today.  She is doing well today.  She has completed radiation therapy without difficulty.  She had some mild joint pain in her left knee and saw her PCP.  She discussed her neuropathy with him and her Gabapentin was increased to 450m TID.  She was recommended by Dr. KHumphrey Rollsto go to neurology, but the patient did not go because her neuropathy improved with the Gabapentin dose increase. She only has minor problems with turning pages while reading. The patient is c/o a right eye irritating lesion on the inner lower lid and slight gritty feeling. She otherwise is doing well and denies fevers, chills, chest pain, palpitations, joint aches, or any further concerns.  Her health maintenance was reviewed below.    Medications:  Current Outpatient Prescriptions  Medication Sig Dispense Refill  . B Complex-C (SUPER B COMPLEX PO) Take 1 tablet by mouth every morning.      . carvedilol (COREG) 6.25 MG tablet Take 1 tablet (6.25 mg total) by mouth 2 (two) times daily with a meal.  180 tablet  3  . emollient (BIAFINE) cream Apply topically as needed.      . gabapentin (NEURONTIN) 100 MG capsule TAKE (1) CAPSULE 4 TIMES DAILY.      .Marland KitchenglyBURIDE-metformin (GLUCOVANCE) 2.5-500 MG per tablet Take 1 tablet by mouth daily with breakfast.       . lidocaine-prilocaine (EMLA) cream Apply topically  as needed.  30 g  1  . lovastatin (MEVACOR) 10 MG tablet Take 10 mg by mouth at bedtime.       . meloxicam (MOBIC) 7.5 MG tablet Take 7.5 mg by mouth 2 (two) times daily. Take 1 tablet  By mouth twice daily      . Multiple Vitamin (MULTIVITAMIN) capsule Take 1 capsule by mouth daily.      Marland Kitchen tretinoin (RETIN-A) 0.025 % cream Apply  topically at bedtime.      Marland Kitchen UNABLE TO FIND Rx: L8015-Post Mastectomy Camisole (Quantity: 2); L8000-Post Surgical Bras (Quantity: 6) Dx: 174.9 Right partial mastectomy  1 each  0  . 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  1 Units  0  . bacitracin-polymyxin b (POLYSPORIN) ophthalmic ointment Place 1 application into the right eye 2 (two) times daily. apply to eye every 12 hours while awake  3.5 g  0   No current facility-administered medications for this visit.   Facility-Administered Medications Ordered in Other Visits  Medication Dose Route Frequency Provider Last Rate Last Dose  . gentamicin (GARAMYCIN) 160 mg in dextrose 5 % 50 mL IVPB  160 mg Intravenous 30 min Pre-Op Franchot Gallo, MD         Allergies:  Allergies  Allergen Reactions  . Oxycodone Other (See Comments)    hallucinations    Medical History: Past Medical History  Diagnosis Date  . Syncope   . Diastolic dysfunction   . Hyperlipidemia   . Episode of dizziness     Mild episodes  . Orthostatic hypotension     Some episodes  . Arthritis     oa  . Stress incontinence, female     wears pads  . AC (acromioclavicular) joint bone spurs     bone spurs in neck  . 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 Diabetes mellitus     niddm; last A1C 6.2  . Hypertension   . Wears glasses   . Breast cancer   . Skin cancer     multiple sites (neck, nose, etc.)    Surgical History:  Past Surgical History  Procedure Laterality Date  . Other surgical history      Hysterectomy  . Cholecystectomy  1992  . Abdominal hysterectomy  1985    1 ovary removed  . Total hip arthroplasty  02/23/2011    Procedure: TOTAL HIP ARTHROPLASTY;  Surgeon: Dione Plover Aluisio;  Location: [facility];  Service: Orthopedics;  Laterality: Left;  . Pubovaginal sling  02/03/2012    Procedure: Gaynelle Arabian;  Surgeon: Franchot Gallo, MD;  Location: [facility];  Service: Urology;   Laterality: N/A;  1 HR  LYNX SLING  . Tonsillectomy    . Breast lumpectomy with needle localization and axillary sentinel lymph node bx Right 10/04/2012    Procedure: RIGHT BREAST LUMPECTOMY WITH NEEDLE LOCALIZATION AND AXILLARY SENTINEL LYMPH NODE BX;  Surgeon: [name], MD;  Location: [place];  Service: General;  Laterality: Right;  . Portacath placement Left 10/04/2012    Procedure: INSERTION PORT-A-CATH;  Surgeon: [name], MD;  Location: [place];  Service: General;  Laterality: Left;  . Orif wrist fracture Left 11/06/2012    Procedure: LEFT OPEN REDUCTION INTERNAL FIXATION (ORIF) DISTAL RADIUS WRIST FRACTURE;  Surgeon: Tennis Must, MD;  Location: [place];  Service: Orthopedics;  Laterality: Left;     Review of Systems: A 10  point review of systems was conducted and is otherwise negative except for what is noted above.   Health Maintenance  Mammogram: 09/11/12 Colonoscopy: 01/14/2009 with f/u recommended in 10 years Bone Density Scan: several years ago Pap Smear: unknown, TAH/with one ovary left in [place] Exam: Fall 2014 Vitamin D Level: never Lipid Panel: 11/2012  Physical Exam: Blood pressure 126/83, pulse 73, temperature 98.7 F (37.1 C), temperature source Oral, resp. rate 20, height '5\' 6"'  (1.676 m), weight 183 lb 8 oz (83.235 kg). GENERAL: Patient is a well appearing female in no acute distress HEENT:  Sclerae anicteric.  Oropharynx clear and moist. No ulcerations or evidence of oropharyngeal candidiasis. Neck is supple. Irritation visualized at lower lid of right eye NODES:  No cervical, supraclavicular, or axillary lymphadenopathy palpated.  BREAST EXAM:  Deferred. LUNGS:  Clear to auscultation bilaterally.  No wheezes or rhonchi. HEART:  Regular rate and rhythm. No murmur appreciated. ABDOMEN:  Soft, nontender.  Positive, normoactive bowel sounds. No organomegaly palpated. MSK:  No focal spinal  tenderness to palpation. Full range of motion bilaterally in the upper extremities. EXTREMITIES:  No peripheral edema.   SKIN:  Clear with no obvious rashes or skin changes. No nail dyscrasia. NEURO:  Nonfocal. Well oriented.  Appropriate affect. ECOG PERFORMANCE STATUS: 1 - Symptomatic but completely ambulatory   Lab Results: Lab Results  Component Value Date   WBC 7.3 05/20/2013   HGB 12.8 05/20/2013   HCT 38.1 05/20/2013   MCV 90.7 05/20/2013   PLT 267 05/20/2013     Chemistry      Component Value Date/Time   NA 142 05/20/2013 1116   NA 135 11/01/2012 1350   K 4.7 05/20/2013 1116   K 4.2 11/01/2012 1350   CL 96 11/01/2012 1350   CO2 23 05/20/2013 1116   CO2 26 11/01/2012 1350   BUN 18.7 05/20/2013 1116   BUN 10 11/01/2012 1350   CREATININE 0.7 05/20/2013 1116   CREATININE 0.64 11/01/2012 1350      Component Value Date/Time   CALCIUM 9.9 05/20/2013 1116   CALCIUM 9.7 11/01/2012 1350   ALKPHOS 89 05/20/2013 1116   ALKPHOS 77 02/17/2011 1045   AST 17 05/20/2013 1116   AST 26 02/17/2011 1045   ALT 12 05/20/2013 1116   ALT 36* 02/17/2011 1045   BILITOT 0.36 05/20/2013 1116   BILITOT 0.5 02/17/2011 1045      Assessment and Plan: Angela Rosario 73 y.o. female with  1. Stage I triple positive invasive ductal carcinoma of the right breast.  The patient has underwent right breast lumpectomy, adjuvant chemotherapy with Herceptin, adjuvant radiation, and continues to receive Herceptin every 3 weeks to complete out one year of Herceptin treatment.  Her CBC is stable.  She will proceed with Herceptin today.    2. Cardiac.  The patient last underwent an echocardiogram on 02/13/13 that demonstrated a LVEF of 55-60%.  She was evaluated in the cardio onc clinic on 02/20/13 by Dr. Haroldine Laws.  He reviewed her echocardiogram.  She was cleared to continue Herceptin therapy and was recommended 3 month follow up with echocardiogram.  This is scheduled for 05/24/13.    3.  ER positivity.  The patient completed radiation therapy on  05/14/13.  Her breast cancer was ER/PR positive.  She will need to start an anti-estrogen therapy.  I have given her information on various aromatase inhibitors today.  I also ordered a bone density.  The patient will f/u with Dr. Humphrey Rolls in  3 weeks for her to initiate anti-estrogen therapy.    4. Possible eye infection:  I prescribed Polysporin ophthalmic ointment for the patient to apply to her lower lid BID.  I also recommended warm compresses BID.    The patient will return for labs, evaluation, and Herceptin treatment in 3 weeks.  She knows to call us in the interim should she have any questions or concerns.  We can certainly see her sooner if needed.    I spent 25 minutes counseling the patient face to face.  The total time spent in the appointment was 30 minutes.  Minette Headland, [place] [phone] 05/21/2013 3:19 PM

## 2013-05-20 ENCOUNTER — Ambulatory Visit (HOSPITAL_BASED_OUTPATIENT_CLINIC_OR_DEPARTMENT_OTHER): Payer: Medicare Other

## 2013-05-20 ENCOUNTER — Ambulatory Visit (HOSPITAL_BASED_OUTPATIENT_CLINIC_OR_DEPARTMENT_OTHER): Payer: Medicare Other | Admitting: Adult Health

## 2013-05-20 ENCOUNTER — Other Ambulatory Visit (HOSPITAL_BASED_OUTPATIENT_CLINIC_OR_DEPARTMENT_OTHER): Payer: Medicare Other

## 2013-05-20 ENCOUNTER — Encounter: Payer: Self-pay | Admitting: Adult Health

## 2013-05-20 ENCOUNTER — Other Ambulatory Visit: Payer: Self-pay | Admitting: Cardiovascular Disease

## 2013-05-20 VITALS — BP 126/83 | HR 73 | Temp 98.7°F | Resp 20 | Ht 66.0 in | Wt 183.5 lb

## 2013-05-20 DIAGNOSIS — C50919 Malignant neoplasm of unspecified site of unspecified female breast: Secondary | ICD-10-CM

## 2013-05-20 DIAGNOSIS — H579 Unspecified disorder of eye and adnexa: Secondary | ICD-10-CM

## 2013-05-20 DIAGNOSIS — Z170 Estrogen receptor positive status [ER+]: Secondary | ICD-10-CM

## 2013-05-20 DIAGNOSIS — C50119 Malignant neoplasm of central portion of unspecified female breast: Secondary | ICD-10-CM

## 2013-05-20 DIAGNOSIS — H44009 Unspecified purulent endophthalmitis, unspecified eye: Secondary | ICD-10-CM

## 2013-05-20 DIAGNOSIS — Z5112 Encounter for antineoplastic immunotherapy: Secondary | ICD-10-CM

## 2013-05-20 LAB — CBC WITH DIFFERENTIAL/PLATELET
BASO%: 0.6 % (ref 0.0–2.0)
BASOS ABS: 0 10*3/uL (ref 0.0–0.1)
EOS ABS: 0.3 10*3/uL (ref 0.0–0.5)
EOS%: 3.5 % (ref 0.0–7.0)
HCT: 38.1 % (ref 34.8–46.6)
HEMOGLOBIN: 12.8 g/dL (ref 11.6–15.9)
LYMPH%: 26.4 % (ref 14.0–49.7)
MCH: 30.4 pg (ref 25.1–34.0)
MCHC: 33.5 g/dL (ref 31.5–36.0)
MCV: 90.7 fL (ref 79.5–101.0)
MONO#: 0.5 10*3/uL (ref 0.1–0.9)
MONO%: 7.5 % (ref 0.0–14.0)
NEUT#: 4.5 10*3/uL (ref 1.5–6.5)
NEUT%: 62 % (ref 38.4–76.8)
Platelets: 267 10*3/uL (ref 145–400)
RBC: 4.2 10*6/uL (ref 3.70–5.45)
RDW: 14.2 % (ref 11.2–14.5)
WBC: 7.3 10*3/uL (ref 3.9–10.3)
lymph#: 1.9 10*3/uL (ref 0.9–3.3)

## 2013-05-20 LAB — COMPREHENSIVE METABOLIC PANEL (CC13)
ALBUMIN: 4.1 g/dL (ref 3.5–5.0)
ALT: 12 U/L (ref 0–55)
AST: 17 U/L (ref 5–34)
Alkaline Phosphatase: 89 U/L (ref 40–150)
Anion Gap: 11 mEq/L (ref 3–11)
BUN: 18.7 mg/dL (ref 7.0–26.0)
CO2: 23 mEq/L (ref 22–29)
Calcium: 9.9 mg/dL (ref 8.4–10.4)
Chloride: 108 mEq/L (ref 98–109)
Creatinine: 0.7 mg/dL (ref 0.6–1.1)
GLUCOSE: 111 mg/dL (ref 70–140)
POTASSIUM: 4.7 meq/L (ref 3.5–5.1)
Sodium: 142 mEq/L (ref 136–145)
Total Bilirubin: 0.36 mg/dL (ref 0.20–1.20)
Total Protein: 6.6 g/dL (ref 6.4–8.3)

## 2013-05-20 MED ORDER — SODIUM CHLORIDE 0.9 % IV SOLN
Freq: Once | INTRAVENOUS | Status: AC
  Administered 2013-05-20: 13:00:00 via INTRAVENOUS

## 2013-05-20 MED ORDER — SODIUM CHLORIDE 0.9 % IJ SOLN
10.0000 mL | INTRAMUSCULAR | Status: DC | PRN
  Administered 2013-05-20: 10 mL
  Filled 2013-05-20: qty 10

## 2013-05-20 MED ORDER — DIPHENHYDRAMINE HCL 25 MG PO CAPS: 50.0000 mg | ORAL_CAPSULE | Freq: Once | ORAL | Status: DC

## 2013-05-20 MED ORDER — BACITRACIN-POLYMYXIN B 500-10000 UNIT/GM OP OINT: 1.0000 "application " | TOPICAL_OINTMENT | Freq: Two times a day (BID) | OPHTHALMIC | Status: DC

## 2013-05-20 MED ORDER — ACETAMINOPHEN 325 MG PO TABS
650.0000 mg | ORAL_TABLET | Freq: Once | ORAL | Status: AC
  Administered 2013-05-20: 650 mg via ORAL

## 2013-05-20 MED ORDER — ACETAMINOPHEN 325 MG PO TABS
ORAL_TABLET | ORAL | Status: AC
  Filled 2013-05-20: qty 2

## 2013-05-20 MED ORDER — HEPARIN SOD (PORK) LOCK FLUSH 100 UNIT/ML IV SOLN
500.0000 [IU] | Freq: Once | INTRAVENOUS | Status: AC | PRN
  Administered 2013-05-20: 500 [IU]
  Filled 2013-05-20: qty 5

## 2013-05-20 MED ORDER — TRASTUZUMAB CHEMO INJECTION 440 MG
6.0000 mg/kg | Freq: Once | INTRAVENOUS | Status: AC
  Administered 2013-05-20: 483 mg via INTRAVENOUS
  Filled 2013-05-20: qty 23

## 2013-05-20 NOTE — Progress Notes (Signed)
  Radiation Oncology         [phone]) [phone] ________________________________  Name: Angela Rosario MRN: [id number]  Date: 05/14/2013  DOB: July 18, 1940  End of Treatment Note  Diagnosis:  Stage I,  Pathologic T1c N0 INVASIVE GRADE II DUCTAL CARCINOMA of the right breast, ER/PR positive HER-2/neu positive    Indication for treatment:  curative       Radiation treatment dates:   04/01/2013-05/14/2013  Site/dose:   1) Right breast / 50 Gy in 25 fractions 2) Right breast boost / 10 Gy in 5 fractions  Beams/energy:    1) opposed tangents / 6MV photons 2) en face electrons / 9 MeV   Narrative: The patient tolerated radiation treatment relatively well. She developed erythema of the skin of her right breast.  Plan: The patient has completed radiation treatment. The patient will return to radiation oncology clinic for routine followup in one month. I advised them to call or return sooner if they have any questions or concerns related to their recovery or treatment.  -----------------------------------  Eppie Gibson, MD

## 2013-05-20 NOTE — Patient Instructions (Signed)
[facility] Discharge Instructions for Patients Receiving Chemotherapy  Today you received the following chemotherapy agents Herceptin.  To help prevent nausea and vomiting after your treatment, we encourage you to take your nausea medication as prescribed.   If you develop nausea and vomiting that is not controlled by your nausea medication, call the clinic.   BELOW ARE SYMPTOMS THAT SHOULD BE REPORTED IMMEDIATELY:  *FEVER GREATER THAN 100.5 F  *CHILLS WITH OR WITHOUT FEVER  NAUSEA AND VOMITING THAT IS NOT CONTROLLED WITH YOUR NAUSEA MEDICATION  *UNUSUAL SHORTNESS OF BREATH  *UNUSUAL BRUISING OR BLEEDING  TENDERNESS IN MOUTH AND THROAT WITH OR WITHOUT PRESENCE OF ULCERS  *URINARY PROBLEMS  *BOWEL PROBLEMS  UNUSUAL RASH Items with * indicate a potential emergency and should be followed up as soon as possible.  Feel free to call the clinic you have any questions or concerns. The clinic phone number is [phone].    

## 2013-05-20 NOTE — Patient Instructions (Signed)
Anastrozole tablets What is this medicine? ANASTROZOLE (an AS troe zole) is used to treat breast cancer in women who have gone through menopause. Some types of breast cancer depend on estrogen to grow, and this medicine can stop tumor growth by blocking estrogen production. This medicine may be used for other purposes; ask your health care provider or pharmacist if you have questions. COMMON BRAND NAME(S): Arimidex What should I tell my health care provider before I take this medicine? They need to know if you have any of these conditions: -liver disease -an unusual or allergic reaction to anastrozole, other medicines, foods, dyes, or preservatives -pregnant or trying to get pregnant -breast-feeding How should I use this medicine? Take this medicine by mouth with a glass of water. Follow the directions on the prescription label. You can take this medicine with or without food. Take your doses at regular intervals. Do not take your medicine more often than directed. Do not stop taking except on the advice of your doctor or health care professional. Talk to your pediatrician regarding the use of this medicine in children. Special care may be needed. Overdosage: If you think you have taken too much of this medicine contact a poison control center or emergency room at once. NOTE: This medicine is only for you. Do not share this medicine with others. What if I miss a dose? If you miss a dose, take it as soon as you can. If it is almost time for your next dose, take only that dose. Do not take double or extra doses. What may interact with this medicine? Do not take this medicine with any of the following medications: -female hormones, like estrogens or progestins and birth control pills This medicine may also interact with the following medications: -tamoxifen This list may not describe all possible interactions. Give your health care provider a list of all the medicines, herbs, non-prescription  drugs, or dietary supplements you use. Also tell them if you smoke, drink alcohol, or use illegal drugs. Some items may interact with your medicine. What should I watch for while using this medicine? Visit your doctor or health care professional for regular checks on your progress. Let your doctor or health care professional know about any unusual vaginal bleeding. Do not treat yourself for diarrhea, nausea, vomiting or other side effects. Ask your doctor or health care professional for advice. What side effects may I notice from receiving this medicine? Side effects that you should report to your doctor or health care professional as soon as possible: -allergic reactions like skin rash, itching or hives, swelling of the face, lips, or tongue -any new or unusual symptoms -breathing problems -chest pain -leg pain or swelling -vomiting Side effects that usually do not require medical attention (report to your doctor or health care professional if they continue or are bothersome): -back or bone pain -cough, or throat infection -diarrhea or constipation -dizziness -headache -hot flashes -loss of appetite -nausea -sweating -weakness and tiredness -weight gain This list may not describe all possible side effects. Call your doctor for medical advice about side effects. You may report side effects to FDA at 1-800-FDA-1088. Where should I keep my medicine? Keep out of the reach of children. Store at room temperature between 20 and 25 degrees C (68 and 77 degrees F). Throw away any unused medicine after the expiration date. NOTE: This sheet is a summary. It may not cover all possible information. If you have questions about this medicine, talk to your doctor, pharmacist,   or health care provider.  2014, Elsevier/Gold Standard. (2007-04-13 16:31:52) Letrozole tablets What is this medicine? LETROZOLE (LET roe zole) blocks the production of estrogen. Certain types of breast cancer grow under the  influence of estrogen. Letrozole helps block tumor growth. This medicine is used to treat advanced breast cancer in postmenopausal women. This medicine may be used for other purposes; ask your health care provider or pharmacist if you have questions. COMMON BRAND NAME(S): Femara What should I tell my health care provider before I take this medicine? They need to know if you have any of these conditions: -liver disease -osteoporosis (weak bones) -an unusual or allergic reaction to letrozole, other medicines, foods, dyes, or preservatives -pregnant or trying to get pregnant -breast-feeding How should I use this medicine? Take this medicine by mouth with a glass of water. You may take it with or without food. Follow the directions on the prescription label. Take your medicine at regular intervals. Do not take your medicine more often than directed. Do not stop taking except on your doctor's advice. Talk to your pediatrician regarding the use of this medicine in children. Special care may be needed. Overdosage: If you think you have taken too much of this medicine contact a poison control center or emergency room at once. NOTE: This medicine is only for you. Do not share this medicine with others. What if I miss a dose? If you miss a dose, take it as soon as you can. If it is almost time for your next dose, take only that dose. Do not take double or extra doses. What may interact with this medicine? Do not take this medicine with any of the following medications: -estrogens, like hormone replacement therapy or birth control pills This medicine may also interact with the following medications: -dietary supplements such as androstenedione or DHEA -prasterone -tamoxifen This list may not describe all possible interactions. Give your health care provider a list of all the medicines, herbs, non-prescription drugs, or dietary supplements you use. Also tell them if you smoke, drink alcohol, or use  illegal drugs. Some items may interact with your medicine. What should I watch for while using this medicine? Visit your doctor or health care professional for regular check-ups to monitor your condition. Do not use this drug if you are pregnant. Serious side effects to an unborn child are possible. Talk to your doctor or pharmacist for more information. You may get drowsy or dizzy. Do not drive, use machinery, or do anything that needs mental alertness until you know how this medicine affects you. Do not stand or sit up quickly, especially if you are an older patient. This reduces the risk of dizzy or fainting spells. What side effects may I notice from receiving this medicine? Side effects that you should report to your doctor or health care professional as soon as possible: -allergic reactions like skin rash, itching, or hives -bone fracture -chest pain -difficulty breathing or shortness of breath -severe pain, swelling, warmth in the leg -unusually weak or tired -vaginal bleeding Side effects that usually do not require medical attention (report to your doctor or health care professional if they continue or are bothersome): -bone, back, joint, or muscle pain -dizziness -fatigue -fluid retention -headache -hot flashes, night sweats -nausea -weight gain This list may not describe all possible side effects. Call your doctor for medical advice about side effects. You may report side effects to FDA at 1-800-FDA-1088. Where should I keep my medicine? Keep out of the reach of  children. Store between 15 and 30 degrees C (59 and 86 degrees F). Throw away any unused medicine after the expiration date. NOTE: This sheet is a summary. It may not cover all possible information. If you have questions about this medicine, talk to your doctor, pharmacist, or health care provider.  2014, Elsevier/Gold Standard. (2007-04-13 16:43:44) Exemestane tablets What is this medicine? EXEMESTANE (ex e MES  tane) blocks the production of the hormone estrogen. Some types of breast cancer depend on estrogen to grow, and this medicine can stop tumor growth by blocking estrogen production. This medicine is for the treatment of breast cancer in postmenopausal women only. This medicine may be used for other purposes; ask your health care provider or pharmacist if you have questions. COMMON BRAND NAME(S): Aromasin What should I tell my health care provider before I take this medicine? They need to know if you have any of these conditions: -an unusual or allergic reaction to exemestane, other medicines, foods, dyes, or preservatives -pregnant or trying to get pregnant -breast-feeding How should I use this medicine? Take this medicine by mouth with a glass of water. Follow the directions on the prescription label. Take your doses at regular intervals after a meal. Do not take your medicine more often than directed. Do not stop taking except on the advice of your doctor or health care professional. Contact your pediatrician regarding the use of this medicine in children. Special care may be needed. Overdosage: If you think you have taken too much of this medicine contact a poison control center or emergency room at once. NOTE: This medicine is only for you. Do not share this medicine with others. What if I miss a dose? If you miss a dose, take the next dose as usual. Do not try to make up the missed dose. Do not take double or extra doses. What may interact with this medicine? Do not take this medicine with any of the following medications: -female hormones, like estrogens and birth control pills This medicine may also interact with the following medications: -androstenedione -phenytoin -rifabutin, rifampin, or rifapentine -St. John's Wort This list may not describe all possible interactions. Give your health care provider a list of all the medicines, herbs, non-prescription drugs, or dietary supplements  you use. Also tell them if you smoke, drink alcohol, or use illegal drugs. Some items may interact with your medicine. What should I watch for while using this medicine? Visit your doctor or health care professional for regular checks on your progress. If you experience hot flashes or sweating while taking this medicine, avoid alcohol, smoking and drinks with caffeine. This may help to decrease these side effects. What side effects may I notice from receiving this medicine? Side effects that you should report to your doctor or health care professional as soon as possible: -any new or unusual symptoms -changes in vision -fever -leg or arm swelling -pain in bones, joints, or muscles -pain in hips, back, ribs, arms, shoulders, or legs Side effects that usually do not require medical attention (report to your doctor or health care professional if they continue or are bothersome): -difficulty sleeping -headache -hot flashes -sweating -unusually weak or tired This list may not describe all possible side effects. Call your doctor for medical advice about side effects. You may report side effects to FDA at 1-800-FDA-1088. Where should I keep my medicine? Keep out of the reach of children. Store at room temperature between 15 and 30 degrees C (59 and 86 degrees F). Throw  away any unused medicine after the expiration date. NOTE: This sheet is a summary. It may not cover all possible information. If you have questions about this medicine, talk to your doctor, pharmacist, or health care provider.  2014, Elsevier/Gold Standard. (2007-06-05 11:48:29)

## 2013-05-22 ENCOUNTER — Telehealth (HOSPITAL_COMMUNITY): Payer: Self-pay | Admitting: Cardiology

## 2013-05-22 NOTE — Telephone Encounter (Signed)
Looks like this is a Heart Failure patient. 

## 2013-05-22 NOTE — Telephone Encounter (Signed)
Angela Rosario called after [facility] visit, pt has multiple concerns PCP refused to give orders as HH was started by Dr.Hochrein/Dr. Aundra Dubin -orders are to have [facility] visits twice a week, pt declines second weekly visit - pt c/o cough after starting new med (lisinorpil) -pt c/o swelling and bruising in groin area (pt is on coumadin) -pt c/o increased urination, will have to urinate q hour and all through the night (currently taking Lasix 20 mg @ 9 am and d/c liquids @ 6pm) -pt request to have labs done at one office only, will not have labs rechecked at multiple offices   Please advise

## 2013-05-22 NOTE — Telephone Encounter (Signed)
This was entered in error on wrong patient, information has been transferred to correct pt's chart

## 2013-05-23 ENCOUNTER — Telehealth: Payer: Self-pay | Admitting: *Deleted

## 2013-05-23 NOTE — Telephone Encounter (Signed)
Received voice message from patient asking if it's OK to get her teeth clean? Per Mendel Ryder, NP, it's OK. Patient verbalized understanding.

## 2013-05-24 ENCOUNTER — Ambulatory Visit (HOSPITAL_COMMUNITY)
Admission: RE | Admit: 2013-05-24 | Discharge: 2013-05-24 | Disposition: A | Discharge status: Home / Self Care | Payer: Medicare Other | Source: Ambulatory Visit | Attending: Internal Medicine | Admitting: Internal Medicine

## 2013-05-24 ENCOUNTER — Ambulatory Visit (HOSPITAL_BASED_OUTPATIENT_CLINIC_OR_DEPARTMENT_OTHER)
Admission: RE | Admit: 2013-05-24 | Discharge: 2013-05-24 | Disposition: A | Discharge status: Home / Self Care | Payer: Medicare Other | Source: Ambulatory Visit | Attending: Cardiology | Admitting: Cardiology

## 2013-05-24 ENCOUNTER — Encounter (HOSPITAL_COMMUNITY): Payer: Self-pay

## 2013-05-24 VITALS — BP 122/68 | HR 77 | Wt 183.1 lb

## 2013-05-24 DIAGNOSIS — C50119 Malignant neoplasm of central portion of unspecified female breast: Secondary | ICD-10-CM

## 2013-05-24 DIAGNOSIS — C50112 Malignant neoplasm of central portion of left female breast: Secondary | ICD-10-CM

## 2013-05-24 DIAGNOSIS — I5032 Chronic diastolic (congestive) heart failure: Secondary | ICD-10-CM

## 2013-05-24 DIAGNOSIS — I10 Essential (primary) hypertension: Secondary | ICD-10-CM

## 2013-05-24 DIAGNOSIS — I517 Cardiomegaly: Secondary | ICD-10-CM

## 2013-05-24 DIAGNOSIS — C50919 Malignant neoplasm of unspecified site of unspecified female breast: Secondary | ICD-10-CM | POA: Insufficient documentation

## 2013-05-24 MED ORDER — LISINOPRIL 5 MG PO TABS: 5.0000 mg | ORAL_TABLET | Freq: Every day | ORAL | Status: DC

## 2013-05-24 NOTE — Patient Instructions (Addendum)
Lisinopril 5 mg daily  Your physician recommends that you schedule a follow-up appointment in: 6 week with echocardiogram

## 2013-05-24 NOTE — Progress Notes (Signed)
  Echocardiogram 2D Echocardiogram has been performed.  Angela Rosario 05/24/2013, 10:15 AM

## 2013-05-26 DIAGNOSIS — I10 Essential (primary) hypertension: Secondary | ICD-10-CM | POA: Insufficient documentation

## 2013-05-26 NOTE — Progress Notes (Signed)
Patient ID: Angela Rosario, female   DOB: 1941/01/17, 73 y.o.   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   She also has a history S/P R lumpectomy, sinus tach on carvedilol, DM, retinitis pigmentosa, and HTN with no known coronary disease referred to cardio-oncology clinic by Dr Humphrey Rolls.  She was started on adjuvant chemotherapy consisting of [place] (Taxotere/Carboplatin/Herceptin). Followed by radiation. Followed by herceptin for 1 year.   She presents today with her sister. Denies SOB with exertion.  She walks her dog 1/2-1 mile most days.  No chest pain.  She has gained about 10 lbs with some ankle swelling.  Occasional mild lightheadedness with standing, not bad.   ECHO 09/2012 EF 60% lateral S 10.4 GLS -23.7% ECHO 02/13/13 EF 60-65% lateral S' 10.6 GLS - 19.8% ECHO 4/15 EF 55% (clearly less vigorous), lateral s' 9.7 cm/sec, GLS -16.8%  Labs (4/15): K 4.7, creatinine 0.7  FH: GM had CVA Father parkinson  SH: Lives alone. Retired Economist. 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   Review of Systems: All systems reviewed and negative except as per HPI.   Past Medical History  Diagnosis Date  . Syncope   . Diastolic dysfunction   . Hyperlipidemia   . Episode of dizziness     Mild episodes  . Orthostatic hypotension     Some episodes  . Arthritis     oa  . Stress incontinence, female     wears pads  . AC (acromioclavicular) joint bone spurs     bone spurs in neck  . 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 Diabetes mellitus     niddm; last A1C 6.2  . Hypertension   . Wears glasses   . Breast cancer   . Skin cancer     multiple sites (neck, nose, etc.)    Current Outpatient Prescriptions  Medication Sig Dispense Refill  . B Complex-C (SUPER B COMPLEX PO) Take 1 tablet by mouth every  morning.      . bacitracin-polymyxin b (POLYSPORIN) ophthalmic ointment Place 1 application into the right eye 2 (two) times daily. apply to eye every 12 hours while awake  3.5 g  0  . carvedilol (COREG) 6.25 MG tablet TAKE  (1)  TABLET TWICE A DAY WITH MEALS (BREAKFAST AND SUPPER)  180 tablet  0  . emollient (BIAFINE) cream Apply topically as needed.      . gabapentin (NEURONTIN) 100 MG capsule TAKE (1) CAPSULE 4 TIMES DAILY.      Marland Kitchen glyBURIDE-metformin (GLUCOVANCE) 2.5-500 MG per tablet Take 1 tablet by mouth daily with breakfast.       . lidocaine-prilocaine (EMLA) cream Apply topically as needed.  30 g  1  . lovastatin (MEVACOR) 10 MG tablet Take 10 mg by mouth at bedtime.       . meloxicam (MOBIC) 7.5 MG tablet Take 7.5 mg by mouth 2 (two) times daily. Take 1 tablet  By mouth twice daily      . Multiple Vitamin (MULTIVITAMIN) capsule Take 1 capsule by mouth daily.      Marland Kitchen tretinoin (RETIN-A) 0.025 % cream Apply topically at bedtime.      Marland Kitchen UNABLE TO FIND Rx: L8015-Post Mastectomy Camisole (Quantity: 2); L8000-Post Surgical Bras (Quantity: 6) Dx: 174.9 Right partial mastectomy  1 each  0  . 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  1 Units  0  . lisinopril (PRINIVIL,ZESTRIL) 5 MG tablet Take 1 tablet (5 mg total) by mouth daily.  30 tablet  3   No current facility-administered medications for this encounter.   Facility-Administered Medications Ordered in Other Encounters  Medication Dose Route Frequency Provider Last Rate Last Dose  . gentamicin (GARAMYCIN) 160 mg in dextrose 5 % 50 mL IVPB  160 mg Intravenous 30 min Pre-Op Franchot Gallo, MD         Allergies  Allergen Reactions  . Oxycodone Other (See Comments)    hallucinations    History   Social History  . Marital Status: Single    Spouse Name: N/A    Number of Children: N/A  . Years of Education: N/A   Occupational History  . Not on file.   Social History Main Topics  . Smoking status:  Former Smoker -- 0.25 packs/day for 1 years    Quit date: 02/14/1957  . Smokeless tobacco: Never Used  . Alcohol Use: No  . Drug Use: No  . Sexual Activity: Not Currently    Birth Control/ Protection: Surgical   Other Topics Concern  . Not on file   Social History Narrative   Divorced, lives alone with her poodle   PHYSICAL EXAM: Filed Vitals:   05/24/13 1123  BP: 122/68  Pulse: 77   General:  Well appearing. No respiratory difficulty HEENT: normal Neck: supple. no JVD. Carotids 2+ bilat; no bruits. No lymphadenopathy or thryomegaly appreciated. Cor: PMI nondisplaced. Regular rate & rhythm. No rubs, gallops or murmurs. Lungs: clear Abdomen: soft, nontender, nondistended. No hepatosplenomegaly. No bruits or masses. Good bowel sounds. Extremities: no cyanosis, clubbing, rash. Trace ankle edema.  Neuro: alert & oriented x 3, cranial nerves grossly intact. moves all 4 extremities w/o difficulty. Affect pleasant.   ASSESSMENT & PLAN: 1. R Breast Cancer: Stage 1. HER-2/neu positive. She has been getting Herceptin.  I reviewed today's echo.  EF is 55% but clearly less vigorous than on the prior echo.  Lateral s' has fallen significantly and global longitudinal strain is significantly less negative than on the initial echo.  This is concerning for cardiotoxic effect from Herceptin.  - I would hold Herceptin for now.  - Continue Coreg, add lisinopril 5 mg daily.  - BMET in 2 wks.  - Repeat echo and followup in the office in 6 wks.  If functional indices improved, would consider restarting Herceptin.  2. HTN: BP not elevated.  She is already on Coreg, will add lisinopril.   [name] 05/26/2013

## 2013-05-27 NOTE — Progress Notes (Signed)
Question.  Do you want me to re-check her Bmet in 2 weeks?  [place]

## 2013-05-30 ENCOUNTER — Other Ambulatory Visit: Payer: Self-pay

## 2013-06-08 NOTE — Progress Notes (Signed)
Hematology and Oncology Follow Up Visit  [name] [id number] [birth year] 73 y.o. 06/12/2013 3:02 PM     Principle Diagnosis:Angela Rosario 73 y.o. 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     Prior Therapy:  1. The patient  palpated a mass approximately 6 months after her annual mammogram. Diagnostic mammogram on 09/11/2012 showed a 7 mm mass at the 12 o'clock position. Ultrasound showed the mass to be 7 mm at the 12 o'clock position. Right breast needle core biopsy of the 12 o'clock position on 09/12/2012 showed invasive mammary carcinoma with lobular features, grade 2, estrogen receptor 100% positive, progesterone receptor 100% positive, Ki-67 45%, HER-2/neu by CISH showed amplification with ratio 2.96. Bilateral breast MRI on 09/18/2012 showed a 1.5 x 1.7 x 1.5 cm irregular mass in the right breast with no mass or abnormal enhancements in the left breast or lymph nodes (clinical stage I, T1 N0).   2. 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   3. Adjuvant chemotherapy consisting of [place] (Taxotere/Carboplatin/Herceptin) originally scheduled to start on 11/19/2012 however the patient suffered a fall on 11/06/2012 and subsequently underwent arm fracture repair surgery. She received three weeks of Herceptin therapy that started on 11/19/2012 and adjuvant chemotherapy consisting of [place] started on 12/10/2012. She completed therapy [place] therapy on 02/18/13 and began adjuvant every 3 week Herceptin on  02/25/13.   4. Adjuvant radiation therapy with Dr. Isidore Moos from 04/01/13 through 05/14/13.    Current therapy:  Herceptin every 3 weeks  Interim History: Angela Rosario 73 y.o. 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  She is here prior to receive Herceptin treatment today.  She is doing very well.  She was recently seen in our cardio-onc clinic and her echo had increased strain.  She was started on Coreg and Lisinopril.  She is taking these and tolerating them well.  She denies fevers, chills, nausea, vomiting, constipation, diarrhea, orthopnea, DOE, lower extremity swelling, weight changes, new pain, or any further concerns.    Medications:  Current Outpatient Prescriptions  Medication Sig Dispense Refill  . B Complex-C (SUPER B COMPLEX PO) Take 1 tablet by mouth every morning.      . bacitracin-polymyxin b (POLYSPORIN) ophthalmic ointment Place 1 application into the right eye 2 (two) times daily. apply to eye every 12 hours while awake  3.5 g  0  . carvedilol (COREG) 6.25 MG tablet TAKE  (1)  TABLET TWICE A DAY WITH MEALS (BREAKFAST AND SUPPER)  180 tablet  0  . gabapentin (NEURONTIN) 100 MG capsule TAKE (1) CAPSULE 4 TIMES DAILY.      Marland Kitchen glyBURIDE-metformin (GLUCOVANCE) 2.5-500 MG per tablet Take 1 tablet by mouth daily with breakfast.       . lidocaine-prilocaine (EMLA) cream Apply topically as needed.  30 g  1  . lisinopril (PRINIVIL,ZESTRIL) 5 MG tablet Take 1 tablet (5 mg total) by mouth daily.  30 tablet  3  . lovastatin (MEVACOR) 10 MG tablet Take 10 mg by mouth at bedtime.       Marland Kitchen  meloxicam (MOBIC) 7.5 MG tablet Take 7.5 mg by mouth 2 (two) times daily. Take 1 tablet  By mouth twice daily      . Multiple Vitamin (MULTIVITAMIN) capsule Take 1 capsule by mouth daily.      Marland Kitchen tretinoin (RETIN-A) 0.025 % cream Apply topically at bedtime.      Marland Kitchen UNABLE TO FIND Rx: L8015-Post Mastectomy Camisole (Quantity: 2); L8000-Post Surgical Bras (Quantity:  6) Dx: 174.9 Right partial mastectomy  1 each  0  . 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  1 Units  0  . letrozole (FEMARA) 2.5 MG tablet Take 1 tablet (2.5 mg total) by mouth daily.  30 tablet  3   No current facility-administered medications for this visit.   Facility-Administered Medications Ordered in Other Visits  Medication Dose Route Frequency Provider Last Rate Last Dose  . gentamicin (GARAMYCIN) 160 mg in dextrose 5 % 50 mL IVPB  160 mg Intravenous 30 min Pre-Op Franchot Gallo, MD         Allergies:  Allergies  Allergen Reactions  . Oxycodone Other (See Comments)    hallucinations    Medical History: Past Medical History  Diagnosis Date  . Syncope   . Diastolic dysfunction   . Hyperlipidemia   . Episode of dizziness     Mild episodes  . Orthostatic hypotension     Some episodes  . Arthritis     oa  . Stress incontinence, female     wears pads  . AC (acromioclavicular) joint bone spurs     bone spurs in neck  . 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 Diabetes mellitus     niddm; last A1C 6.2  . Hypertension   . Wears glasses   . Breast cancer   . Skin cancer     multiple sites (neck, nose, etc.)    Surgical History:  Past Surgical History  Procedure Laterality Date  . Other surgical history      Hysterectomy  . Cholecystectomy  1992  . Abdominal hysterectomy  1985    1 ovary removed  . Total hip arthroplasty  02/23/2011    Procedure: TOTAL HIP ARTHROPLASTY;  Surgeon: Dione Plover Aluisio;  Location: [facility];  Service: Orthopedics;  Laterality: Left;  . Pubovaginal sling  02/03/2012    Procedure: Gaynelle Arabian;  Surgeon: Franchot Gallo, MD;  [facility];  Service: Urology;  Laterality: N/A;  1 HR  LYNX SLING  . Tonsillectomy    . Breast lumpectomy with needle localization and axillary sentinel lymph node bx Right 10/04/2012    Procedure: RIGHT BREAST LUMPECTOMY WITH  NEEDLE LOCALIZATION AND AXILLARY SENTINEL LYMPH NODE BX;  Surgeon: [name], MD;  Location: [facility];  Service: General;  Laterality: Right;  . Portacath placement Left 10/04/2012    Procedure: INSERTION PORT-A-CATH;  Surgeon: [name], MD;  Location: [facility];  Service: General;  Laterality: Left;  . Orif wrist fracture Left 11/06/2012    Procedure: LEFT OPEN REDUCTION INTERNAL FIXATION (ORIF) DISTAL RADIUS WRIST FRACTURE;  Surgeon: Tennis Must, MD;  Location: [facility];  Service: Orthopedics;  Laterality: Left;     Review of Systems: A 10 point review of systems was conducted and is otherwise negative except for what is noted above.   Health Maintenance  Mammogram: 09/11/12 Colonoscopy: 01/14/2009 with f/u recommended in 10 years Bone Density Scan: several years ago Pap  Smear: unknown, TAH/with one ovary left in 1985 Eye Exam: Fall 2014 Vitamin D Level: never Lipid Panel: 11/2012  Physical Exam: Blood pressure 102/62, pulse 73, temperature 98.3 F (36.8 C), temperature source Oral, resp. rate 20, height _0  (1.676 m), weight 181 lb (82.101 kg). GENERAL: Patient is a well appearing female in no acute distress HEENT:  Sclerae anicteric.  Oropharynx clear and moist. No ulcerations or evidence of oropharyngeal candidiasis. Neck is supple. NODES:  No cervical, supraclavicular, or axillary lymphadenopathy palpated.  BREAST EXAM:  Deferred. LUNGS:  Clear to auscultation bilaterally.  No wheezes or rhonchi. HEART:  Regular rate and rhythm. No murmur appreciated. ABDOMEN:  Soft, nontender.  Positive, normoactive bowel sounds. No organomegaly palpated. MSK:  No focal spinal tenderness to palpation. Full range of motion bilaterally in the upper extremities. EXTREMITIES:  No peripheral edema.   SKIN:  Clear with no obvious rashes or skin changes. No nail dyscrasia. NEURO:  Nonfocal. Well oriented.  Appropriate affect. ECOG  PERFORMANCE STATUS: 1 - Symptomatic but completely ambulatory   Lab Results: Lab Results  Component Value Date   WBC 6.2 06/10/2013   HGB 13.1 06/10/2013   HCT 39.3 06/10/2013   MCV 90.6 06/10/2013   PLT 243 06/10/2013     Chemistry      Component Value Date/Time   NA 141 06/10/2013 1121   NA 135 11/01/2012 1350   K 4.6 06/10/2013 1121   K 4.2 11/01/2012 1350   CL 96 11/01/2012 1350   CO2 25 06/10/2013 1121   CO2 26 11/01/2012 1350   BUN 18.2 06/10/2013 1121   BUN 10 11/01/2012 1350   CREATININE 0.8 06/10/2013 1121   CREATININE 0.64 11/01/2012 1350      Component Value Date/Time   CALCIUM 10.0 06/10/2013 1121   CALCIUM 9.7 11/01/2012 1350   ALKPHOS 84 06/10/2013 1121   ALKPHOS 77 02/17/2011 1045   AST 18 06/10/2013 1121   AST 26 02/17/2011 1045   ALT 13 06/10/2013 1121   ALT 36* 02/17/2011 1045   BILITOT 0.50 06/10/2013 1121   BILITOT 0.5 02/17/2011 1045      Assessment and Plan: Angela Rosario 73 y.o. female with  1. Stage I triple positive invasive ductal carcinoma of the right breast.  The patient has underwent right breast lumpectomy, adjuvant chemotherapy with Herceptin, adjuvant radiation, and continues to receive Herceptin every 3 weeks to complete out one year of Herceptin treatment.   We will hold herceptin today due to the below.  I did prescribe Letrozole daily because the patient is ER/PR positive.  I gave her detailed information about this in her AVS.  A bone density is pending.    2. Cardiac.  The patient last underwent an echocardiogram on 05/24/13 that demonstrated a LVEF of 55%.  She was evaluated in the cardio onc clinic on 05/24/13 by Dr. Aundra Dubin. Per Dr. Aundra Dubin, her lateral s' had fallen significantly and global longitudinal strain was significantly less negative that the initial echo. He recommended Herceptin be held, to continue Coreg, and for Lisinopril 68m was added daily.  An echo and f/u was recommended in 6 weeks.    The patient will return for labs, evaluation, and  Herceptin treatment in 6 weeks.  She knows to call [facility] the interim should she have any questions or concerns.  We can certainly see her sooner if needed.    I spent 25 minutes counseling the patient face to face.  The total time spent in  the appointment was 30 minutes.  Minette Headland, [place] [phone] 06/12/2013 3:02 PM

## 2013-06-10 ENCOUNTER — Ambulatory Visit: Payer: Medicare Other

## 2013-06-10 ENCOUNTER — Telehealth: Payer: Self-pay | Admitting: Adult Health

## 2013-06-10 ENCOUNTER — Ambulatory Visit: Payer: Medicare Other | Admitting: Oncology

## 2013-06-10 ENCOUNTER — Encounter: Payer: Self-pay | Admitting: Adult Health

## 2013-06-10 ENCOUNTER — Telehealth: Payer: Self-pay | Admitting: *Deleted

## 2013-06-10 ENCOUNTER — Other Ambulatory Visit (HOSPITAL_BASED_OUTPATIENT_CLINIC_OR_DEPARTMENT_OTHER): Payer: Medicare Other

## 2013-06-10 ENCOUNTER — Ambulatory Visit (HOSPITAL_BASED_OUTPATIENT_CLINIC_OR_DEPARTMENT_OTHER): Payer: Medicare Other | Admitting: Adult Health

## 2013-06-10 ENCOUNTER — Other Ambulatory Visit: Payer: Medicare Other

## 2013-06-10 VITALS — BP 102/62 | HR 73 | Temp 98.3°F | Resp 20 | Ht 66.0 in | Wt 181.0 lb

## 2013-06-10 DIAGNOSIS — C50919 Malignant neoplasm of unspecified site of unspecified female breast: Secondary | ICD-10-CM

## 2013-06-10 DIAGNOSIS — C50119 Malignant neoplasm of central portion of unspecified female breast: Secondary | ICD-10-CM

## 2013-06-10 DIAGNOSIS — R9389 Abnormal findings on diagnostic imaging of other specified body structures: Secondary | ICD-10-CM

## 2013-06-10 DIAGNOSIS — Z79811 Long term (current) use of aromatase inhibitors: Secondary | ICD-10-CM

## 2013-06-10 LAB — CBC WITH DIFFERENTIAL/PLATELET
BASO%: 0.6 % (ref 0.0–2.0)
BASOS ABS: 0 10*3/uL (ref 0.0–0.1)
EOS ABS: 0.2 10*3/uL (ref 0.0–0.5)
EOS%: 3.6 % (ref 0.0–7.0)
HCT: 39.3 % (ref 34.8–46.6)
HEMOGLOBIN: 13.1 g/dL (ref 11.6–15.9)
LYMPH%: 25.8 % (ref 14.0–49.7)
MCH: 30.2 pg (ref 25.1–34.0)
MCHC: 33.3 g/dL (ref 31.5–36.0)
MCV: 90.6 fL (ref 79.5–101.0)
MONO#: 0.5 10*3/uL (ref 0.1–0.9)
MONO%: 7.5 % (ref 0.0–14.0)
NEUT%: 62.5 % (ref 38.4–76.8)
NEUTROS ABS: 3.8 10*3/uL (ref 1.5–6.5)
PLATELETS: 243 10*3/uL (ref 145–400)
RBC: 4.33 10*6/uL (ref 3.70–5.45)
RDW: 14.8 % — ABNORMAL HIGH (ref 11.2–14.5)
WBC: 6.2 10*3/uL (ref 3.9–10.3)
lymph#: 1.6 10*3/uL (ref 0.9–3.3)

## 2013-06-10 LAB — COMPREHENSIVE METABOLIC PANEL (CC13)
ALBUMIN: 4.2 g/dL (ref 3.5–5.0)
ALT: 13 U/L (ref 0–55)
ANION GAP: 8 meq/L (ref 3–11)
AST: 18 U/L (ref 5–34)
Alkaline Phosphatase: 84 U/L (ref 40–150)
BUN: 18.2 mg/dL (ref 7.0–26.0)
CO2: 25 meq/L (ref 22–29)
Calcium: 10 mg/dL (ref 8.4–10.4)
Chloride: 108 mEq/L (ref 98–109)
Creatinine: 0.8 mg/dL (ref 0.6–1.1)
GLUCOSE: 109 mg/dL (ref 70–140)
POTASSIUM: 4.6 meq/L (ref 3.5–5.1)
Sodium: 141 mEq/L (ref 136–145)
Total Bilirubin: 0.5 mg/dL (ref 0.20–1.20)
Total Protein: 6.5 g/dL (ref 6.4–8.3)

## 2013-06-10 MED ORDER — LETROZOLE 2.5 MG PO TABS: 2.5000 mg | ORAL_TABLET | Freq: Every day | ORAL | Status: DC

## 2013-06-10 MED ORDER — ACETAMINOPHEN 325 MG PO TABS
ORAL_TABLET | ORAL | Status: AC
  Filled 2013-06-10: qty 2

## 2013-06-10 NOTE — Patient Instructions (Signed)
Letrozole tablets  What is this medicine?  LETROZOLE (LET roe zole) blocks the production of estrogen. Certain types of breast cancer grow under the influence of estrogen. Letrozole helps block tumor growth. This medicine is used to treat advanced breast cancer in postmenopausal women.  This medicine may be used for other purposes; ask your health care provider or pharmacist if you have questions.  COMMON BRAND NAME(S): Femara  What should I tell my health care provider before I take this medicine?  They need to know if you have any of these conditions:  -liver disease  -osteoporosis (weak bones)  -an unusual or allergic reaction to letrozole, other medicines, foods, dyes, or preservatives  -pregnant or trying to get pregnant  -breast-feeding  How should I use this medicine?  Take this medicine by mouth with a glass of water. You may take it with or without food. Follow the directions on the prescription label. Take your medicine at regular intervals. Do not take your medicine more often than directed. Do not stop taking except on your doctor's advice.  Talk to your pediatrician regarding the use of this medicine in children. Special care may be needed.  Overdosage: If you think you have taken too much of this medicine contact a poison control center or emergency room at once.  NOTE: This medicine is only for you. Do not share this medicine with others.  What if I miss a dose?  If you miss a dose, take it as soon as you can. If it is almost time for your next dose, take only that dose. Do not take double or extra doses.  What may interact with this medicine?  Do not take this medicine with any of the following medications:  -estrogens, like hormone replacement therapy or birth control pills  This medicine may also interact with the following medications:  -dietary supplements such as androstenedione or DHEA  -prasterone  -tamoxifen  This list may not describe all possible interactions. Give your health care provider  a list of all the medicines, herbs, non-prescription drugs, or dietary supplements you use. Also tell them if you smoke, drink alcohol, or use illegal drugs. Some items may interact with your medicine.  What should I watch for while using this medicine?  Visit your doctor or health care professional for regular check-ups to monitor your condition.  Do not use this drug if you are pregnant. Serious side effects to an unborn child are possible. Talk to your doctor or pharmacist for more information.  You may get drowsy or dizzy. Do not drive, use machinery, or do anything that needs mental alertness until you know how this medicine affects you. Do not stand or sit up quickly, especially if you are an older patient. This reduces the risk of dizzy or fainting spells.  What side effects may I notice from receiving this medicine?  Side effects that you should report to your doctor or health care professional as soon as possible:  -allergic reactions like skin rash, itching, or hives  -bone fracture  -chest pain  -difficulty breathing or shortness of breath  -severe pain, swelling, warmth in the leg  -unusually weak or tired  -vaginal bleeding  Side effects that usually do not require medical attention (report to your doctor or health care professional if they continue or are bothersome):  -bone, back, joint, or muscle pain  -dizziness  -fatigue  -fluid retention  -headache  -hot flashes, night sweats  -nausea  -weight gain  This   list may not describe all possible side effects. Call your doctor for medical advice about side effects. You may report side effects to FDA at 1-800-FDA-1088.  Where should I keep my medicine?  Keep out of the reach of children.  Store between 15 and 30 degrees C (59 and 86 degrees F). Throw away any unused medicine after the expiration date.  NOTE: This sheet is a summary. It may not cover all possible information. If you have questions about this medicine, talk to your doctor, pharmacist, or  health care provider.   2014, Elsevier/Gold Standard. (2007-04-13 16:43:44)

## 2013-06-10 NOTE — Telephone Encounter (Signed)
, °

## 2013-06-10 NOTE — Telephone Encounter (Signed)
Per staff message and POF I have scheduled appts.  JMW  

## 2013-06-12 ENCOUNTER — Ambulatory Visit: Payer: Medicare Other | Admitting: Radiation Oncology

## 2013-06-12 NOTE — Progress Notes (Signed)
Simulation Verification Note Outpatient 03-29-13  The patient was brought to the treatment unit and placed in the planned treatment position. The clinical setup was verified. Then port films were obtained and uploaded to the radiation oncology medical record software.  The treatment beams were carefully compared against the planned radiation fields. The position location and shape of the radiation fields was reviewed. They targeted volume of tissue appears to be appropriately covered by the radiation beams. Organs at risk appear to be excluded as planned.  Based on my personal review, I approved the simulation verification. The patient's treatment will proceed as planned.  -----------------------------------  Eppie Gibson, MD

## 2013-06-12 NOTE — Addendum Note (Signed)
Encounter addended by: Eppie Gibson, MD on: 06/12/2013  7:51 PM<BR>     Documentation filed: Notes Section

## 2013-06-18 ENCOUNTER — Encounter: Payer: Self-pay | Admitting: Radiation Oncology

## 2013-06-19 ENCOUNTER — Ambulatory Visit
Admission: RE | Admit: 2013-06-19 | Discharge: 2013-06-19 | Disposition: A | Discharge status: Home / Self Care | Payer: Medicare Other | Source: Ambulatory Visit | Attending: Radiation Oncology | Admitting: Radiation Oncology

## 2013-06-19 ENCOUNTER — Encounter: Payer: Self-pay | Admitting: Radiation Oncology

## 2013-06-19 VITALS — BP 99/60 | HR 69 | Temp 98.9°F | Ht 66.0 in | Wt 182.2 lb

## 2013-06-19 DIAGNOSIS — C50119 Malignant neoplasm of central portion of unspecified female breast: Secondary | ICD-10-CM

## 2013-06-19 HISTORY — DX: Personal history of irradiation: Z92.3

## 2013-06-19 HISTORY — DX: Malignant neoplasm of unspecified site of unspecified female breast: C50.919

## 2013-06-19 NOTE — Progress Notes (Signed)
Angela Rosario is here for her first follow-up s/p radiation therapy to her right breast.  She reports that has has tenderness in her breast.  Note mild erythema, but skin remains intact.  She denies any fatigue

## 2013-06-19 NOTE — Progress Notes (Signed)
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  She was given a Letrozole Rx last month. Tolerating this well. She has some tenderness in her right breast, still - it is mild.                              ALLERGIES:  is allergic to oxycodone.  Meds: Current Outpatient Prescriptions  Medication Sig Dispense Refill  . B Complex-C (SUPER B COMPLEX PO) Take 1 tablet by mouth every morning.      . carvedilol (COREG) 6.25 MG tablet TAKE  (1)  TABLET TWICE A DAY WITH MEALS (BREAKFAST AND SUPPER)  180 tablet  0  . gabapentin (NEURONTIN) 100 MG capsule TAKE (1) CAPSULE 4 TIMES DAILY.      Marland Kitchen glyBURIDE-metformin (GLUCOVANCE) 2.5-500 MG per tablet Take 1 tablet by mouth daily with breakfast.       . letrozole (FEMARA) 2.5 MG tablet Take 1 tablet (2.5 mg total) by mouth daily.  30 tablet  3  . lidocaine-prilocaine (EMLA) cream Apply topically as needed.  30 g  1  . lisinopril (PRINIVIL,ZESTRIL) 5 MG tablet Take 1 tablet (5 mg total) by mouth daily.  30 tablet  3  . lovastatin (MEVACOR) 10 MG tablet Take 10 mg by mouth at bedtime.       . meloxicam (MOBIC) 7.5 MG tablet Take 7.5 mg by mouth 2 (two) times daily. Take 1 tablet  By mouth twice daily      . Multiple Vitamin (MULTIVITAMIN) capsule Take 1 capsule by mouth daily.      Marland Kitchen tretinoin (RETIN-A) 0.025 % cream Apply topically at bedtime.      . bacitracin-polymyxin b (POLYSPORIN)  ophthalmic ointment Place 1 application into the right eye 2 (two) times daily. apply to eye every 12 hours while awake  3.5 g  0   No current facility-administered medications for this encounter.   Facility-Administered Medications Ordered in Other Encounters  Medication Dose Route Frequency Provider Last Rate Last Dose  . gentamicin (GARAMYCIN) 160 mg in dextrose 5 % 50 mL IVPB  160 mg Intravenous 30 min Pre-Op Franchot Gallo, MD        Physical Findings: The patient is in no acute distress. Patient is alert and oriented.  height is '5\' 6"'  (1.676 m) and weight is 182 lb 3.2 oz (82.645 kg). Her temperature is 98.9 F (37.2 C). Her blood pressure is 99/60 and her pulse is 69. .   Very faint erythema over right breast. Skin has healed very well.   Lab Findings: Lab Results  Component Value Date   WBC 6.2 06/10/2013   HGB 13.1 06/10/2013   HCT 39.3 06/10/2013   MCV 90.6 06/10/2013   PLT 243 06/10/2013    Radiographic Findings: No results found.  Impression/Plan: She has healed well. I told her to apply Vit E lotion  or cocoa butter for a month if she wishes for further skin care.  I gave her a [facility] pamphlet.  I encouraged her to continue with yearly mammography and followup with medical oncology. I will see her back on an as-needed basis. I have encouraged her to call if she has any issues or concerns in the future. I wished her the very best.    _____________________________________   Eppie Gibson, MD

## 2013-06-25 ENCOUNTER — Encounter: Payer: Self-pay | Admitting: Oncology

## 2013-06-25 NOTE — Progress Notes (Signed)
Per [employer] will cover herceptin. No asst is available.

## 2013-06-26 ENCOUNTER — Encounter: Payer: Self-pay | Admitting: Adult Health

## 2013-06-26 NOTE — Progress Notes (Signed)
Per Thedora Hinders should not be a copy?? I sent over copies of invs for Feb and March to see if asst is available for patient.

## 2013-07-02 ENCOUNTER — Telehealth: Payer: Self-pay

## 2013-07-02 NOTE — Telephone Encounter (Signed)
Message rcvd by fax from [facility] 07/01/13, Amarjit Dhillon.  Copy to [place].  Original to scan.

## 2013-07-04 ENCOUNTER — Telehealth: Payer: Self-pay | Admitting: Adult Health

## 2013-07-04 ENCOUNTER — Encounter (HOSPITAL_COMMUNITY): Payer: Self-pay

## 2013-07-04 ENCOUNTER — Ambulatory Visit (HOSPITAL_COMMUNITY)
Admission: RE | Admit: 2013-07-04 | Discharge: 2013-07-04 | Disposition: A | Discharge status: Home / Self Care | Payer: Medicare Other | Source: Ambulatory Visit | Attending: Internal Medicine | Admitting: Internal Medicine

## 2013-07-04 ENCOUNTER — Ambulatory Visit (HOSPITAL_BASED_OUTPATIENT_CLINIC_OR_DEPARTMENT_OTHER)
Admission: RE | Admit: 2013-07-04 | Discharge: 2013-07-04 | Disposition: A | Discharge status: Home / Self Care | Payer: Medicare Other | Source: Ambulatory Visit | Attending: Internal Medicine | Admitting: Internal Medicine

## 2013-07-04 VITALS — BP 122/66 | HR 69 | Wt 182.4 lb

## 2013-07-04 DIAGNOSIS — Z79899 Other long term (current) drug therapy: Secondary | ICD-10-CM | POA: Insufficient documentation

## 2013-07-04 DIAGNOSIS — C50119 Malignant neoplasm of central portion of unspecified female breast: Secondary | ICD-10-CM

## 2013-07-04 DIAGNOSIS — C50919 Malignant neoplasm of unspecified site of unspecified female breast: Secondary | ICD-10-CM | POA: Insufficient documentation

## 2013-07-04 DIAGNOSIS — I517 Cardiomegaly: Secondary | ICD-10-CM

## 2013-07-04 DIAGNOSIS — I10 Essential (primary) hypertension: Secondary | ICD-10-CM

## 2013-07-04 MED ORDER — CARVEDILOL 6.25 MG PO TABS: 9.3750 mg | ORAL_TABLET | Freq: Two times a day (BID) | ORAL | Status: DC

## 2013-07-04 NOTE — Progress Notes (Signed)
Echo Lab  2D Echocardiogram completed.  [place]ma, RDCS 07/04/2013 10:37 AM

## 2013-07-04 NOTE — Progress Notes (Signed)
Patient ID: Angela Rosario, female   DOB: May 28, 1940, 73 y.o.   MRN: [id number] Oncologist: Dr Humphrey Rolls PCP: Dr Osborne Casco  HPI: Angela Rosario is a 73 y.o.with a history of stage I R breast cancer, invasive ductal carcinoma of the right breast HER-2/neu positive with Ki-67 of 45%.   She also has a history S/P R lumpectomy, sinus tach on carvedilol, DM, retinitis pigmentosa, and HTN with no known coronary disease referred to cardio-oncology clinic by Dr Humphrey Rolls.  She was started on adjuvant chemotherapy consisting of [place] (Taxotere/Carboplatin/Herceptin). Followed by radiation. Followed by herceptin for 1 year.   Patient is doing well overall.  She has occasional mild lightheadedness with standing, no falls.  She can walk 2 miles without dyspnea.  Mild shortness of breath walking up hills. No chest pain.   ECHO 09/2012 EF 60% lateral S 10.4 GLS -23.7% ECHO 02/13/13 EF 60-65% lateral S' 10.6 GLS - 19.8% ECHO 4/15 EF 55% (clearly less vigorous), lateral s' 9.7 cm/sec, GLS -16.8% =>Herceptin held ECHO 5/15 EF 60%, lateral S' 11.7, GLS -18.8  Labs (4/15): K 4.7, creatinine 0.7  FH: GM had CVA Father parkinson  SH: Lives alone. Retired Economist. 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   Review of Systems: All systems reviewed and negative except as per HPI.   Past Medical History  Diagnosis Date  . Syncope   . Diastolic dysfunction   . Hyperlipidemia   . Episode of dizziness     Mild episodes  . Orthostatic hypotension     Some episodes  . Arthritis     oa  . Stress incontinence, female     wears pads  . AC (acromioclavicular) joint bone spurs     bone spurs in neck  . 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 Diabetes mellitus     niddm; last A1C 6.2  . Hypertension   . Wears glasses   . Breast cancer   . Skin cancer     multiple sites (neck, nose, etc.)  . Carcinoma of breast treated with adjuvant chemotherapy      Three weeks of Herceptin therapy that started  on 11/19/2012 and adjuvant chemotherapy consisting of [place] started on 12/10/2012. She completed therapy [place] therapy on 02/18/13 and began adjuvant every 3 week Herceptin on 02/25/13.   . S/P radiation therapy  04/01/2013-05/14/2013    1) Right breast / 50 Gy in 25 fractions, 2) Right breast boost / 10 Gy in 5 fractions    Current Outpatient Prescriptions  Medication Sig Dispense Refill  . B Complex-C (SUPER B COMPLEX PO) Take 1 tablet by mouth every morning.      . bacitracin-polymyxin b (POLYSPORIN) ophthalmic ointment Place 1 application into the right eye 2 (two) times daily. apply to eye every 12 hours while awake  3.5 g  0  . carvedilol (COREG) 6.25 MG tablet Take 1.5 tablets (9.375 mg total) by mouth 2 (two) times daily with a meal.  270 tablet  1  . gabapentin (NEURONTIN) 100 MG capsule TAKE (1) CAPSULE 4 TIMES DAILY.      Marland Kitchen glyBURIDE-metformin (GLUCOVANCE) 2.5-500 MG per tablet Take 1 tablet by mouth daily with breakfast.       . letrozole (FEMARA) 2.5 MG tablet Take 1 tablet (2.5 mg total) by mouth daily.  30 tablet  3  . lidocaine-prilocaine (EMLA) cream Apply topically as needed.  30 g  1  . lisinopril (PRINIVIL,ZESTRIL) 5 MG tablet Take  1 tablet (5 mg total) by mouth daily.  30 tablet  3  . lovastatin (MEVACOR) 10 MG tablet Take 10 mg by mouth at bedtime.       . meloxicam (MOBIC) 7.5 MG tablet Take 7.5 mg by mouth 2 (two) times daily. Take 1 tablet  By mouth twice daily      . Multiple Vitamin (MULTIVITAMIN) capsule Take 1 capsule by mouth daily.      Marland Kitchen tretinoin (RETIN-A) 0.025 % cream Apply topically at bedtime.       No current facility-administered medications for this encounter.   Facility-Administered Medications Ordered in Other Encounters  Medication Dose Route Frequency Provider Last Rate Last Dose  . gentamicin (GARAMYCIN) 160 mg in dextrose 5 % 50 mL IVPB  160 mg Intravenous 30 min Pre-Op Jorja Loa, MD         Allergies  Allergen Reactions  . Oxycodone Other  (See Comments)    hallucinations    History   Social History  . Marital Status: Single    Spouse Name: N/A    Number of Children: N/A  . Years of Education: N/A   Occupational History  . Not on file.   Social History Main Topics  . Smoking status: Former Smoker -- 0.25 packs/day for 1 years    Quit date: 02/14/1957  . Smokeless tobacco: Never Used  . Alcohol Use: No  . Drug Use: No  . Sexual Activity: Not Currently    Birth Control/ Protection: Surgical   Other Topics Concern  . Not on file   Social History Narrative   Divorced, lives alone with her poodle   PHYSICAL EXAM: Filed Vitals:   07/04/13 1115  BP: 122/66  Pulse: 69   General:  Well appearing. No respiratory difficulty HEENT: normal Neck: supple. no JVD. Carotids 2+ bilat; no bruits. No lymphadenopathy or thryomegaly appreciated. Cor: PMI nondisplaced. Regular rate & rhythm. No rubs, gallops or murmurs. Lungs: clear Abdomen: soft, nontender, nondistended. No hepatosplenomegaly. No bruits or masses. Good bowel sounds. Extremities: no cyanosis, clubbing, rash. Trace ankle edema.  Neuro: alert & oriented x 3, cranial nerves grossly intact. moves all 4 extremities w/o difficulty. Affect pleasant.   ASSESSMENT & PLAN: 1. R Breast Cancer: HER-2/neu positive. She has been getting Herceptin.  I reviewed today's echo.  EF is 60% and looks more vigorous than prior echo.  Lateral S' and global longitudinal strain are both higher today.  - She can resume Herceptin.  I will repeat an echo in 6 weeks.  If parameters are stable, will go back to every 3 months.  - Continue lisinopril.  Increase Coreg to 9.375 mg bid.  2. HTN: BP not elevated.    [name] 07/04/2013

## 2013-07-04 NOTE — Telephone Encounter (Signed)
, °

## 2013-07-04 NOTE — Patient Instructions (Signed)
Increase Carvedilol to 9.375 mg (1 and 1/2 tabs) Twice daily   Your physician recommends that you schedule a follow-up appointment in: 6 weeks with echocardiogram

## 2013-07-09 ENCOUNTER — Telehealth: Payer: Self-pay | Admitting: Adult Health

## 2013-07-09 ENCOUNTER — Other Ambulatory Visit (HOSPITAL_BASED_OUTPATIENT_CLINIC_OR_DEPARTMENT_OTHER): Payer: Medicare Other

## 2013-07-09 ENCOUNTER — Encounter: Payer: Self-pay | Admitting: Adult Health

## 2013-07-09 ENCOUNTER — Ambulatory Visit (HOSPITAL_BASED_OUTPATIENT_CLINIC_OR_DEPARTMENT_OTHER): Payer: Medicare Other | Admitting: Adult Health

## 2013-07-09 ENCOUNTER — Telehealth: Payer: Self-pay | Admitting: *Deleted

## 2013-07-09 VITALS — BP 112/67 | HR 66 | Temp 98.7°F | Resp 18 | Ht 66.0 in | Wt 183.8 lb

## 2013-07-09 DIAGNOSIS — Z170 Estrogen receptor positive status [ER+]: Secondary | ICD-10-CM

## 2013-07-09 DIAGNOSIS — Z853 Personal history of malignant neoplasm of breast: Secondary | ICD-10-CM

## 2013-07-09 DIAGNOSIS — C50119 Malignant neoplasm of central portion of unspecified female breast: Secondary | ICD-10-CM

## 2013-07-09 DIAGNOSIS — G629 Polyneuropathy, unspecified: Secondary | ICD-10-CM

## 2013-07-09 LAB — COMPREHENSIVE METABOLIC PANEL (CC13)
ALT: 16 U/L (ref 0–55)
AST: 17 U/L (ref 5–34)
Albumin: 4.1 g/dL (ref 3.5–5.0)
Alkaline Phosphatase: 76 U/L (ref 40–150)
Anion Gap: 9 mEq/L (ref 3–11)
BUN: 17.4 mg/dL (ref 7.0–26.0)
CALCIUM: 9.7 mg/dL (ref 8.4–10.4)
CO2: 25 mEq/L (ref 22–29)
CREATININE: 0.8 mg/dL (ref 0.6–1.1)
Chloride: 108 mEq/L (ref 98–109)
Glucose: 120 mg/dl (ref 70–140)
POTASSIUM: 4.4 meq/L (ref 3.5–5.1)
SODIUM: 142 meq/L (ref 136–145)
Total Bilirubin: 0.49 mg/dL (ref 0.20–1.20)
Total Protein: 6.4 g/dL (ref 6.4–8.3)

## 2013-07-09 LAB — CBC WITH DIFFERENTIAL/PLATELET
BASO%: 0.2 % (ref 0.0–2.0)
Basophils Absolute: 0 10*3/uL (ref 0.0–0.1)
EOS%: 3.4 % (ref 0.0–7.0)
Eosinophils Absolute: 0.2 10*3/uL (ref 0.0–0.5)
HCT: 37.5 % (ref 34.8–46.6)
HGB: 12.6 g/dL (ref 11.6–15.9)
LYMPH#: 1.6 10*3/uL (ref 0.9–3.3)
LYMPH%: 24.8 % (ref 14.0–49.7)
MCH: 30.1 pg (ref 25.1–34.0)
MCHC: 33.6 g/dL (ref 31.5–36.0)
MCV: 89.5 fL (ref 79.5–101.0)
MONO#: 0.5 10*3/uL (ref 0.1–0.9)
MONO%: 7.3 % (ref 0.0–14.0)
NEUT#: 4.1 10*3/uL (ref 1.5–6.5)
NEUT%: 64.3 % (ref 38.4–76.8)
Platelets: 226 10*3/uL (ref 145–400)
RBC: 4.19 10*6/uL (ref 3.70–5.45)
RDW: 15.4 % — AB (ref 11.2–14.5)
WBC: 6.4 10*3/uL (ref 3.9–10.3)
nRBC: 0 % (ref 0–0)

## 2013-07-09 MED ORDER — GABAPENTIN 100 MG PO CAPS: 400.0000 mg | ORAL_CAPSULE | Freq: Every day | ORAL | Status: DC

## 2013-07-09 NOTE — Telephone Encounter (Signed)
Per staff phone call and POF I have schedueld appts.  JMW  

## 2013-07-09 NOTE — Telephone Encounter (Signed)
, °

## 2013-07-09 NOTE — Progress Notes (Signed)
ID: [name] OB: 1940/12/30  MR#: [id number]  CLE#:[id number]  PCP: Haywood Pao, MD GYN:   SU: Dr Donne Hazel OTHER MD:  Dr. Isidore Moos: radiation oncology  CHIEF COMPLAINT: Patient with right breast triple positive invasive mammary here for evaluation prior to herceptin.    BREAST CANCER HISTORY:  The patient palpated a mass approximately 6 months after her annual mammogram. Diagnostic mammogram on 09/11/2012 showed a 7 mm mass at the 12 o'clock position. Ultrasound showed the mass to be 7 mm at the 12 o'clock position. Right breast needle core biopsy of the 12 o'clock position on 09/12/2012 showed invasive mammary carcinoma with lobular features, grade 2, estrogen receptor 100% positive, progesterone receptor 100% positive, Ki-67 45%, HER-2/neu by CISH showed amplification with ratio 2.96. Bilateral breast MRI on 09/18/2012 showed a 1.5 x 1.7 x 1.5 cm irregular mass in the right breast with no mass or abnormal enhancements in the left breast or lymph nodes (clinical stage I, T1 N0).  CURRENT THERAPY: Letrozole daily, Herceptin every three weeks  INTERVAL HISTORY:  Angela Rosario is here today for evaluation prior to receiving Herceptin therapy.  She is scheduled to receive this tomorrow.  She is doing well.  She was recently evaluted by Dr. Aundra Dubin in the cardio-onc clinic and was cleared to restart herceptin with 6 week f/u that was scheduled on 08/14/13.  The numbness in her fingertips remains stable.  She is taking Gabapentin 400 mg QHS and tolerating it well.  She is requesting that I order her next mammogram.  She is otherwise doing well.  She was started on Letrozole daily following her radiaiton therapy and is tolerating it very well.  She denies hot flashes, joint aches, vaginal dryness, or any further concerns.    REVIEW OF SYSTEMS: A 10 point review of systems was conducted and is otherwise negative except for what is noted above.     PAST MEDICAL HISTORY: Past Medical History   Diagnosis Date  . Syncope   . Diastolic dysfunction   . Hyperlipidemia   . Episode of dizziness     Mild episodes  . Orthostatic hypotension     Some episodes  . Arthritis     oa  . Stress incontinence, female     wears pads  . AC (acromioclavicular) joint bone spurs     bone spurs in neck  . 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 Diabetes mellitus     niddm; last A1C 6.2  . Hypertension   . Wears glasses   . Breast cancer   . Skin cancer     multiple sites (neck, nose, etc.)  . Carcinoma of breast treated with adjuvant chemotherapy      Three weeks of Herceptin therapy that started on 11/19/2012 and adjuvant chemotherapy consisting of [place] started on 12/10/2012. She completed therapy [place] therapy on 02/18/13 and began adjuvant every 3 week Herceptin on 02/25/13.   . S/P radiation therapy  04/01/2013-05/14/2013    1) Right breast / 50 Gy in 25 fractions, 2) Right breast boost / 10 Gy in 5 fractions    PAST SURGICAL HISTORY: Past Surgical History  Procedure Laterality Date  . Other surgical history      Hysterectomy  . Cholecystectomy  1992  . Abdominal hysterectomy  1985    1 ovary removed  . Total hip arthroplasty  02/23/2011    Procedure: TOTAL HIP ARTHROPLASTY;  Surgeon: Dione Plover Aluisio;  Location: [facility];  Service: Orthopedics;  Laterality: Left;  . Pubovaginal sling  02/03/2012    Procedure: Gaynelle Arabian;  Surgeon: Franchot Gallo, MD;  Location: [facility];  Service: Urology;  Laterality: N/A;  1 HR  LYNX SLING  . Tonsillectomy    . Breast lumpectomy with needle localization and axillary sentinel lymph node bx Right 10/04/2012    Procedure: RIGHT BREAST LUMPECTOMY WITH NEEDLE LOCALIZATION AND AXILLARY SENTINEL LYMPH NODE BX;  Surgeon: [name], MD;  Location: [facility];  Service: General;  Laterality: Right;  . Portacath placement Left 10/04/2012    Procedure: INSERTION PORT-A-CATH;  Surgeon: [name], MD;  Location: [place];  Service: General;  Laterality: Left;  . Orif wrist fracture Left 11/06/2012    Procedure: LEFT OPEN REDUCTION INTERNAL FIXATION (ORIF) DISTAL RADIUS WRIST FRACTURE;  Surgeon: Tennis Must, MD;  Location: [place];  Service: Orthopedics;  Laterality: Left;    FAMILY HISTORY Family History  Problem Relation Age of Onset  . Lung cancer Maternal Uncle   . Breast cancer Paternal Aunt     dx <50  . Breast cancer Paternal Uncle 59  . Prostate cancer Cousin     paternal cousin    GYNECOLOGIC HISTORY:   SOCIAL HISTORY:     ADVANCED DIRECTIVES:    HEALTH MAINTENANCE: History  Substance Use Topics  . Smoking status: Former Smoker -- 0.25 packs/day for 1 years    Quit date: 02/14/1957  . Smokeless tobacco: Never Used  . Alcohol Use: No      Mammogram: Colonoscopy: Bone Density Scan:  Pap Smear:  Eye Exam:  Vitamin D Level:   Lipid Panel:    Allergies  Allergen Reactions  . Oxycodone Other (See Comments)    hallucinations    Current Outpatient Prescriptions  Medication Sig Dispense Refill  . B Complex-C (SUPER B COMPLEX PO) Take 1 tablet by mouth every morning.      . bacitracin-polymyxin b (POLYSPORIN) ophthalmic ointment Place 1 application into the right eye 2 (two) times daily. apply to eye every 12 hours while awake  3.5 g  0  . carvedilol (COREG) 6.25 MG tablet Take 1.5 tablets (9.375 mg total) by mouth 2 (two) times daily with a meal.  270 tablet  1  . gabapentin (NEURONTIN) 100 MG capsule TAKE (1) CAPSULE 4 TIMES DAILY.      Marland Kitchen glyBURIDE-metformin (GLUCOVANCE) 2.5-500 MG per tablet Take 1 tablet by mouth daily with breakfast.       . letrozole (FEMARA) 2.5 MG tablet Take 1 tablet (2.5 mg total) by mouth daily.  30 tablet  3  . lidocaine-prilocaine (EMLA) cream Apply topically as needed.  30 g  1  . lisinopril (PRINIVIL,ZESTRIL) 5 MG tablet Take 1 tablet (5 mg total) by mouth daily.  30 tablet   3  . lovastatin (MEVACOR) 10 MG tablet Take 10 mg by mouth at bedtime.       . meloxicam (MOBIC) 7.5 MG tablet Take 7.5 mg by mouth 2 (two) times daily. Take 1 tablet  By mouth twice daily      . Multiple Vitamin (MULTIVITAMIN) capsule Take 1 capsule by mouth daily.      Marland Kitchen tretinoin (RETIN-A) 0.025 % cream Apply topically at bedtime.       No current facility-administered medications for this visit.   Facility-Administered Medications Ordered in Other Visits  Medication Dose Route Frequency Provider Last Rate Last Dose  . gentamicin (GARAMYCIN) 160  mg in dextrose 5 % 50 mL IVPB  160 mg Intravenous 30 min Pre-Op Jorja Loa, MD        OBJECTIVE: Filed Vitals:   07/09/13 1117  BP: 112/67  Pulse: 66  Temp: 98.7 F (37.1 C)  Resp: 18     Body mass index is 29.68 kg/(m^2).     GENERAL: Patient is a well appearing female in no acute distress HEENT:  Sclerae anicteric.  Oropharynx clear and moist. No ulcerations or evidence of oropharyngeal candidiasis. Neck is supple.  NODES:  No cervical, supraclavicular, or axillary lymphadenopathy palpated.  BREAST EXAM:  Deferred. LUNGS:  Clear to auscultation bilaterally.  No wheezes or rhonchi. HEART:  Regular rate and rhythm. No murmur appreciated. ABDOMEN:  Soft, nontender.  Positive, normoactive bowel sounds. No organomegaly palpated. MSK:  No focal spinal tenderness to palpation. Full range of motion bilaterally in the upper extremities. EXTREMITIES:  No peripheral edema.   SKIN:  Clear with no obvious rashes or skin changes. No nail dyscrasia. NEURO:  Nonfocal. Well oriented.  Appropriate affect. ECOG FS:0 - Asymptomatic  LAB RESULTS:  CMP     Component Value Date/Time   NA 142 07/09/2013 1057   NA 135 11/01/2012 1350   K 4.4 07/09/2013 1057   K 4.2 11/01/2012 1350   CL 96 11/01/2012 1350   CO2 25 07/09/2013 1057   CO2 26 11/01/2012 1350   GLUCOSE 120 07/09/2013 1057   GLUCOSE 120* 11/01/2012 1350   BUN 17.4 07/09/2013 1057   BUN  10 11/01/2012 1350   CREATININE 0.8 07/09/2013 1057   CREATININE 0.64 11/01/2012 1350   CALCIUM 9.7 07/09/2013 1057   CALCIUM 9.7 11/01/2012 1350   PROT 6.4 07/09/2013 1057   PROT 7.3 02/17/2011 1045   ALBUMIN 4.1 07/09/2013 1057   ALBUMIN 4.3 02/17/2011 1045   AST 17 07/09/2013 1057   AST 26 02/17/2011 1045   ALT 16 07/09/2013 1057   ALT 36* 02/17/2011 1045   ALKPHOS 76 07/09/2013 1057   ALKPHOS 77 02/17/2011 1045   BILITOT 0.49 07/09/2013 1057   BILITOT 0.5 02/17/2011 1045   GFRNONAA 87* 11/01/2012 1350   GFRAA >90 11/01/2012 1350    I No results found for this basename: SPEP, UPEP,  kappa and lambda light chains    Lab Results  Component Value Date   WBC 6.4 07/09/2013   NEUTROABS 4.1 07/09/2013   HGB 12.6 07/09/2013   HCT 37.5 07/09/2013   MCV 89.5 07/09/2013   PLT 226 07/09/2013      Chemistry      Component Value Date/Time   NA 142 07/09/2013 1057   NA 135 11/01/2012 1350   K 4.4 07/09/2013 1057   K 4.2 11/01/2012 1350   CL 96 11/01/2012 1350   CO2 25 07/09/2013 1057   CO2 26 11/01/2012 1350   BUN 17.4 07/09/2013 1057   BUN 10 11/01/2012 1350   CREATININE 0.8 07/09/2013 1057   CREATININE 0.64 11/01/2012 1350      Component Value Date/Time   CALCIUM 9.7 07/09/2013 1057   CALCIUM 9.7 11/01/2012 1350   ALKPHOS 76 07/09/2013 1057   ALKPHOS 77 02/17/2011 1045   AST 17 07/09/2013 1057   AST 26 02/17/2011 1045   ALT 16 07/09/2013 1057   ALT 36* 02/17/2011 1045   BILITOT 0.49 07/09/2013 1057   BILITOT 0.5 02/17/2011 1045       No results found for this basename: LABCA2    No components found with  this basename: [id number]    No results found for this basename: INR,  in the last 168 hours  Urinalysis    Component Value Date/Time   COLORURINE YELLOW 02/17/2011 [place] 02/17/2011 1049   LABSPEC 1.030 01/28/2013 1537   LABSPEC 1.025 02/17/2011 1049   PHURINE 6.5 02/17/2011 1049   GLUCOSEU Negative 01/28/2013 1537   GLUCOSEU NEGATIVE 02/17/2011 [place] 02/17/2011 [place] 02/17/2011 1049   KETONESUR NEGATIVE 02/17/2011 1049   PROTEINUR NEGATIVE 02/17/2011 1049   UROBILINOGEN 0.2 01/28/2013 1537   UROBILINOGEN 1.0 02/17/2011 1049   NITRITE NEGATIVE 02/17/2011 1049   LEUKOCYTESUR SMALL* 02/17/2011 1049    STUDIES: No results found.  ASSESSMENT: 73 y.o. [place], [place] woman with T1 N0, stage IA right breast invasive ductal carcinoma, grade II, ER 100%, PR 100%, Ki-67 45%, HER-2/neu positive.    1. Status post right breast lumpectomy with right axillary sentinel lymph node biopsy on 10/04/2012 for a stage I, pT1c, pN0, MX, 1.6 cm invasive ductal carcinoma, grade 2 with associated intermediate grade ductal carcinoma in situ, tumor involved the dermis and lymphovascular invasion was identified, close to anterior margin, other margins were negative, estrogen receptor 100% positive, progesterone receptor 100% positive, Ki-67 45%, HER-2/neu amplified, with 0/5 metastatic right axillary lymph nodes.   2. Adjuvant chemotherapy consisting of [place] (Taxotere/Carboplatin/Herceptin) was originally scheduled to start on 11/19/2012 however the patient suffered a fall on 11/06/2012 and subsequently underwent arm fracture repair surgery. She received three weeks of Herceptin therapy that started on 11/19/2012 and adjuvant chemotherapy consisting of [place] started on 12/10/2012. She completed therapy [place] therapy on 02/18/13 and began adjuvant every 3 week Herceptin on 02/25/13.   3. Adjuvant radiation therapy with Dr. Isidore Moos from 04/01/13 through 05/14/13.     PLAN:   Angela Rosario is doing well today.  She was cleared by Dr. Aundra Dubin to restart Herceptin.  Her labs are stable and she will proceed with Herceptin tomorrow.  She is also taking Letrozole daily and is tolerating it well.  She will continue this.  She and I discussed when her next mammogram is due, and I have ordered this to be performed at [facility] in September.    She is taking Gabapentin for her neuropathy.  It is stable.   She is taking 424m at night.  I re-ordered this for her today.    The patient will return in 3 weeks for labs/Herceptin, and in 6 weeks for labs, and evaluation and Herceptin therapy.   She knows to call [facility] the interim for any questions or concerns.  We can certainly see her sooner if needed.  I spent 25 minutes counseling the patient face to face.  The total time spent in the appointment was 30 minutes.  LMinette Headland [phone]/26/2015 11:56 AM

## 2013-07-10 ENCOUNTER — Ambulatory Visit (HOSPITAL_BASED_OUTPATIENT_CLINIC_OR_DEPARTMENT_OTHER): Payer: Medicare Other

## 2013-07-10 ENCOUNTER — Other Ambulatory Visit: Payer: Self-pay | Admitting: Oncology

## 2013-07-10 ENCOUNTER — Ambulatory Visit: Payer: Medicare Other | Admitting: Adult Health

## 2013-07-10 ENCOUNTER — Other Ambulatory Visit: Payer: Medicare Other

## 2013-07-10 VITALS — BP 120/58 | HR 72 | Temp 98.8°F | Resp 18

## 2013-07-10 DIAGNOSIS — C50119 Malignant neoplasm of central portion of unspecified female breast: Secondary | ICD-10-CM

## 2013-07-10 DIAGNOSIS — Z5112 Encounter for antineoplastic immunotherapy: Secondary | ICD-10-CM

## 2013-07-10 MED ORDER — ACETAMINOPHEN 325 MG PO TABS
650.0000 mg | ORAL_TABLET | Freq: Once | ORAL | Status: AC
  Administered 2013-07-10: 650 mg via ORAL

## 2013-07-10 MED ORDER — SODIUM CHLORIDE 0.9 % IJ SOLN
10.0000 mL | INTRAMUSCULAR | Status: DC | PRN
  Administered 2013-07-10: 10 mL
  Filled 2013-07-10: qty 10

## 2013-07-10 MED ORDER — SODIUM CHLORIDE 0.9 % IV SOLN
Freq: Once | INTRAVENOUS | Status: AC
  Administered 2013-07-10: 16:00:00 via INTRAVENOUS

## 2013-07-10 MED ORDER — ACETAMINOPHEN 325 MG PO TABS
ORAL_TABLET | ORAL | Status: AC
  Filled 2013-07-10: qty 2

## 2013-07-10 MED ORDER — DIPHENHYDRAMINE HCL 25 MG PO CAPS: 50.0000 mg | ORAL_CAPSULE | Freq: Once | ORAL | Status: DC

## 2013-07-10 MED ORDER — HEPARIN SOD (PORK) LOCK FLUSH 100 UNIT/ML IV SOLN
500.0000 [IU] | Freq: Once | INTRAVENOUS | Status: AC | PRN
  Administered 2013-07-10: 500 [IU]
  Filled 2013-07-10: qty 5

## 2013-07-10 MED ORDER — TRASTUZUMAB CHEMO INJECTION 440 MG
6.0000 mg/kg | Freq: Once | INTRAVENOUS | Status: AC
  Administered 2013-07-10: 483 mg via INTRAVENOUS
  Filled 2013-07-10: qty 23

## 2013-07-10 NOTE — Patient Instructions (Signed)
[facility] Discharge Instructions for Patients Receiving Chemotherapy  Today you received the following chemotherapy agents Herceptin.  To help prevent nausea and vomiting after your treatment, we encourage you to take your nausea medication as prescribed.   If you develop nausea and vomiting that is not controlled by your nausea medication, call the clinic.   BELOW ARE SYMPTOMS THAT SHOULD BE REPORTED IMMEDIATELY:  *FEVER GREATER THAN 100.5 F  *CHILLS WITH OR WITHOUT FEVER  NAUSEA AND VOMITING THAT IS NOT CONTROLLED WITH YOUR NAUSEA MEDICATION  *UNUSUAL SHORTNESS OF BREATH  *UNUSUAL BRUISING OR BLEEDING  TENDERNESS IN MOUTH AND THROAT WITH OR WITHOUT PRESENCE OF ULCERS  *URINARY PROBLEMS  *BOWEL PROBLEMS  UNUSUAL RASH Items with * indicate a potential emergency and should be followed up as soon as possible.  Feel free to call the clinic you have any questions or concerns. The clinic phone number is [phone].    

## 2013-07-31 ENCOUNTER — Other Ambulatory Visit (HOSPITAL_BASED_OUTPATIENT_CLINIC_OR_DEPARTMENT_OTHER): Payer: Medicare Other

## 2013-07-31 ENCOUNTER — Ambulatory Visit (HOSPITAL_BASED_OUTPATIENT_CLINIC_OR_DEPARTMENT_OTHER): Payer: Medicare Other

## 2013-07-31 VITALS — BP 111/64 | HR 70 | Temp 96.8°F | Resp 20

## 2013-07-31 DIAGNOSIS — C50119 Malignant neoplasm of central portion of unspecified female breast: Secondary | ICD-10-CM

## 2013-07-31 DIAGNOSIS — Z5112 Encounter for antineoplastic immunotherapy: Secondary | ICD-10-CM

## 2013-07-31 LAB — CBC WITH DIFFERENTIAL/PLATELET
BASO%: 0.3 % (ref 0.0–2.0)
Basophils Absolute: 0 10*3/uL (ref 0.0–0.1)
EOS%: 4.8 % (ref 0.0–7.0)
Eosinophils Absolute: 0.3 10*3/uL (ref 0.0–0.5)
HEMATOCRIT: 38.6 % (ref 34.8–46.6)
HGB: 12.9 g/dL (ref 11.6–15.9)
LYMPH%: 25.9 % (ref 14.0–49.7)
MCH: 30 pg (ref 25.1–34.0)
MCHC: 33.4 g/dL (ref 31.5–36.0)
MCV: 89.8 fL (ref 79.5–101.0)
MONO#: 0.6 10*3/uL (ref 0.1–0.9)
MONO%: 8.7 % (ref 0.0–14.0)
NEUT%: 60.3 % (ref 38.4–76.8)
NEUTROS ABS: 3.9 10*3/uL (ref 1.5–6.5)
Platelets: 217 10*3/uL (ref 145–400)
RBC: 4.3 10*6/uL (ref 3.70–5.45)
RDW: 15.2 % — ABNORMAL HIGH (ref 11.2–14.5)
WBC: 6.5 10*3/uL (ref 3.9–10.3)
lymph#: 1.7 10*3/uL (ref 0.9–3.3)

## 2013-07-31 LAB — COMPREHENSIVE METABOLIC PANEL (CC13)
ALBUMIN: 4.2 g/dL (ref 3.5–5.0)
ALT: 14 U/L (ref 0–55)
ANION GAP: 9 meq/L (ref 3–11)
AST: 15 U/L (ref 5–34)
Alkaline Phosphatase: 83 U/L (ref 40–150)
BILIRUBIN TOTAL: 0.59 mg/dL (ref 0.20–1.20)
BUN: 21.5 mg/dL (ref 7.0–26.0)
CALCIUM: 9.8 mg/dL (ref 8.4–10.4)
CHLORIDE: 106 meq/L (ref 98–109)
CO2: 27 mEq/L (ref 22–29)
Creatinine: 0.8 mg/dL (ref 0.6–1.1)
GLUCOSE: 134 mg/dL (ref 70–140)
Potassium: 4.4 mEq/L (ref 3.5–5.1)
SODIUM: 142 meq/L (ref 136–145)
Total Protein: 6.5 g/dL (ref 6.4–8.3)

## 2013-07-31 MED ORDER — ACETAMINOPHEN 325 MG PO TABS
650.0000 mg | ORAL_TABLET | Freq: Once | ORAL | Status: AC
  Administered 2013-07-31: 650 mg via ORAL

## 2013-07-31 MED ORDER — DIPHENHYDRAMINE HCL 25 MG PO CAPS: 50.0000 mg | ORAL_CAPSULE | Freq: Once | ORAL | Status: DC

## 2013-07-31 MED ORDER — DIPHENHYDRAMINE HCL 25 MG PO CAPS
ORAL_CAPSULE | ORAL | Status: AC
  Filled 2013-07-31: qty 2

## 2013-07-31 MED ORDER — TRASTUZUMAB CHEMO INJECTION 440 MG
6.0000 mg/kg | Freq: Once | INTRAVENOUS | Status: AC
  Administered 2013-07-31: 483 mg via INTRAVENOUS
  Filled 2013-07-31: qty 23

## 2013-07-31 MED ORDER — ACETAMINOPHEN 325 MG PO TABS
ORAL_TABLET | ORAL | Status: AC
  Filled 2013-07-31: qty 2

## 2013-07-31 MED ORDER — SODIUM CHLORIDE 0.9 % IJ SOLN
10.0000 mL | INTRAMUSCULAR | Status: DC | PRN
  Administered 2013-07-31: 10 mL
  Filled 2013-07-31: qty 10

## 2013-07-31 MED ORDER — SODIUM CHLORIDE 0.9 % IV SOLN
Freq: Once | INTRAVENOUS | Status: AC
  Administered 2013-07-31: 11:00:00 via INTRAVENOUS

## 2013-07-31 MED ORDER — HEPARIN SOD (PORK) LOCK FLUSH 100 UNIT/ML IV SOLN
500.0000 [IU] | Freq: Once | INTRAVENOUS | Status: AC | PRN
  Administered 2013-07-31: 500 [IU]
  Filled 2013-07-31: qty 5

## 2013-07-31 NOTE — Patient Instructions (Signed)
[facility] Discharge Instructions for Patients Receiving Chemotherapy  Today you received the following chemotherapy agents: Herceptin  To help prevent nausea and vomiting after your treatment, we encourage you to take your nausea medication as prescribed by your physician.    If you develop nausea and vomiting that is not controlled by your nausea medication, call the clinic.   BELOW ARE SYMPTOMS THAT SHOULD BE REPORTED IMMEDIATELY:  *FEVER GREATER THAN 100.5 F  *CHILLS WITH OR WITHOUT FEVER  NAUSEA AND VOMITING THAT IS NOT CONTROLLED WITH YOUR NAUSEA MEDICATION  *UNUSUAL SHORTNESS OF BREATH  *UNUSUAL BRUISING OR BLEEDING  TENDERNESS IN MOUTH AND THROAT WITH OR WITHOUT PRESENCE OF ULCERS  *URINARY PROBLEMS  *BOWEL PROBLEMS  UNUSUAL RASH Items with * indicate a potential emergency and should be followed up as soon as possible.  Feel free to call the clinic you have any questions or concerns. The clinic phone number is [phone].    

## 2013-08-05 ENCOUNTER — Other Ambulatory Visit: Payer: Medicare Other

## 2013-08-05 ENCOUNTER — Ambulatory Visit: Payer: Medicare Other

## 2013-08-14 ENCOUNTER — Ambulatory Visit (HOSPITAL_BASED_OUTPATIENT_CLINIC_OR_DEPARTMENT_OTHER)
Admission: RE | Admit: 2013-08-14 | Discharge: 2013-08-14 | Disposition: A | Discharge status: Home / Self Care | Payer: Medicare Other | Source: Ambulatory Visit | Attending: Internal Medicine | Admitting: Internal Medicine

## 2013-08-14 ENCOUNTER — Ambulatory Visit (HOSPITAL_COMMUNITY)
Admission: RE | Admit: 2013-08-14 | Discharge: 2013-08-14 | Disposition: A | Discharge status: Home / Self Care | Payer: Medicare Other | Source: Ambulatory Visit | Attending: Internal Medicine | Admitting: Internal Medicine

## 2013-08-14 VITALS — BP 104/58 | HR 68 | Wt 187.0 lb

## 2013-08-14 DIAGNOSIS — Z885 Allergy status to narcotic agent status: Secondary | ICD-10-CM | POA: Insufficient documentation

## 2013-08-14 DIAGNOSIS — C50119 Malignant neoplasm of central portion of unspecified female breast: Secondary | ICD-10-CM | POA: Insufficient documentation

## 2013-08-14 DIAGNOSIS — I059 Rheumatic mitral valve disease, unspecified: Secondary | ICD-10-CM

## 2013-08-14 DIAGNOSIS — C50111 Malignant neoplasm of central portion of right female breast: Secondary | ICD-10-CM

## 2013-08-14 MED ORDER — LOSARTAN POTASSIUM 25 MG PO TABS: 25.0000 mg | ORAL_TABLET | Freq: Every day | ORAL | Status: DC

## 2013-08-14 NOTE — Patient Instructions (Signed)
Follow up in 3 months with an ECHO  Stop lisinopril   Take losartan 25 mg daily

## 2013-08-14 NOTE — Progress Notes (Signed)
Patient ID: Angela Rosario, female   DOB: 04-10-1940, 73 y.o.   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   She also has a history S/P R lumpectomy, sinus tach on carvedilol, DM, retinitis pigmentosa, and HTN with no known coronary disease referred to cardio-oncology clinic by Dr Humphrey Rolls.  She was started on adjuvant chemotherapy consisting of [place] (Taxotere/Carboplatin/Herceptin). Followed by radiation. Followed by herceptin for 1 year.   She is tolerating Herceptin. Complaining of dry cough. Denies SOB/Orthopnea. Able to walk a couple miles 3-4 times a week.   ECHO 09/2012 EF 60% lateral S 10.4 GLS -23.7% ECHO 02/13/13 EF 60-65% lateral S' 10.6 GLS - 19.8% ECHO 4/15 EF 55% (clearly less vigorous), lateral s' 9.7 cm/sec, GLS -16.8% =>Herceptin held ECHO 5/15 EF 60%, lateral S' 11.7, GLS -18.8 ECHO 08/14/13 EF 60% Lateral S' 11.3 GLS -20  Labs (4/15): K 4.7, creatinine 0.7 Labs 07/31/13 : K 4.4 Creatinine 0.8  FH: Angela Rosario had CVA Father parkinson  SH: Lives alone. Retired Economist. 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   Review of Systems: All systems reviewed and negative except as per HPI.   Past Medical History  Diagnosis Date  . Syncope   . Diastolic dysfunction   . Hyperlipidemia   . Episode of dizziness     Mild episodes  . Orthostatic hypotension     Some episodes  . Arthritis     oa  . Stress incontinence, female     wears pads  . AC (acromioclavicular) joint bone spurs     bone spurs in neck  . 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 Diabetes mellitus     niddm; last A1C 6.2  . Hypertension   . Wears glasses   . Breast cancer   . Skin cancer     multiple sites (neck, nose, etc.)  . Carcinoma of breast treated with adjuvant chemotherapy      Three weeks of Herceptin therapy that  started on 11/19/2012 and adjuvant chemotherapy consisting of [place] started on 12/10/2012. She completed therapy [place] therapy on 02/18/13 and began adjuvant every 3 week Herceptin on 02/25/13.   . S/P radiation therapy  04/01/2013-05/14/2013    1) Right breast / 50 Gy in 25 fractions, 2) Right breast boost / 10 Gy in 5 fractions    Current Outpatient Prescriptions  Medication Sig Dispense Refill  . B Complex-C (SUPER B COMPLEX PO) Take 1 tablet by mouth every morning.      . bacitracin-polymyxin b (POLYSPORIN) ophthalmic ointment Place 1 application into the right eye 2 (two) times daily. apply to eye every 12 hours while awake  3.5 g  0  . carvedilol (COREG) 6.25 MG tablet Take 1.5 tablets (9.375 mg total) by mouth 2 (two) times daily with a meal.  270 tablet  1  . gabapentin (NEURONTIN) 100 MG capsule Take 4 capsules (400 mg total) by mouth at bedtime.  120 capsule  6  . glyBURIDE-metformin (GLUCOVANCE) 2.5-500 MG per tablet Take 1 tablet by mouth daily with breakfast.       . letrozole (FEMARA) 2.5 MG tablet Take 1 tablet (2.5 mg total) by mouth daily.  30 tablet  3  . lidocaine-prilocaine (EMLA) cream Apply topically as needed.  30 g  1  .  lisinopril (PRINIVIL,ZESTRIL) 5 MG tablet Take 1 tablet (5 mg total) by mouth daily.  30 tablet  3  . lovastatin (MEVACOR) 10 MG tablet Take 10 mg by mouth at bedtime.       . meloxicam (MOBIC) 7.5 MG tablet Take 7.5 mg by mouth 2 (two) times daily. Take 1 tablet  By mouth twice daily      . Multiple Vitamin (MULTIVITAMIN) capsule Take 1 capsule by mouth daily.      Marland Kitchen tretinoin (RETIN-A) 0.025 % cream Apply topically at bedtime.       No current facility-administered medications for this encounter.   Facility-Administered Medications Ordered in Other Encounters  Medication Dose Route Frequency Provider Last Rate Last Dose  . gentamicin (GARAMYCIN) 160 mg in dextrose 5 % 50 mL IVPB  160 mg Intravenous 30 min Pre-Op Jorja Loa, MD         Allergies   Allergen Reactions  . Oxycodone Other (See Comments)    hallucinations    History   Social History  . Marital Status: Single    Spouse Name: N/A    Number of Children: N/A  . Years of Education: N/A   Occupational History  . Not on file.   Social History Main Topics  . Smoking status: Former Smoker -- 0.25 packs/day for 1 years    Quit date: 02/14/1957  . Smokeless tobacco: Never Used  . Alcohol Use: No  . Drug Use: No  . Sexual Activity: Not Currently    Birth Control/ Protection: Surgical   Other Topics Concern  . Not on file   Social History Narrative   Divorced, lives alone with her poodle   PHYSICAL EXAM: Filed Vitals:   08/14/13 1101  BP: 104/58  Pulse: 68   General:  Well appearing. No respiratory difficulty HEENT: normal Neck: supple. no JVD. Carotids 2+ bilat; no bruits. No lymphadenopathy or thryomegaly appreciated. Cor: PMI nondisplaced. Regular rate & rhythm. No rubs, gallops or murmurs. Lungs: clear Abdomen: soft, nontender, nondistended. No hepatosplenomegaly. No bruits or masses. Good bowel sounds. Extremities: no cyanosis, clubbing, rash. No ankle edema.  Neuro: alert & oriented x 3, cranial nerves grossly intact. moves all 4 extremities w/o difficulty. Affect pleasant.   ASSESSMENT & PLAN: 1. R Breast Cancer: HER-2/neu positive. Remains on Herceptin. Dr Aundra Dubin reviewed ECHO and I provided result. Doppler parameters remain stable. Continue Herceptin.  Continue carvedilol 9.375 mg twice a day.  Stop lisinopril due to cough.  Start losartan 25 mg daily -  2. HTN: stable.   Follow up in 3 months with an ECHO     Rise Traeger NP-C  08/14/2013

## 2013-08-14 NOTE — Progress Notes (Signed)
  Echocardiogram 2D Echocardiogram has been performed.  [place], [place] 08/14/2013, 10:51 AM

## 2013-08-21 ENCOUNTER — Ambulatory Visit (HOSPITAL_BASED_OUTPATIENT_CLINIC_OR_DEPARTMENT_OTHER): Payer: Medicare Other | Admitting: Adult Health

## 2013-08-21 ENCOUNTER — Other Ambulatory Visit (HOSPITAL_BASED_OUTPATIENT_CLINIC_OR_DEPARTMENT_OTHER): Payer: Medicare Other

## 2013-08-21 ENCOUNTER — Encounter: Payer: Self-pay | Admitting: Adult Health

## 2013-08-21 ENCOUNTER — Telehealth: Payer: Self-pay | Admitting: *Deleted

## 2013-08-21 ENCOUNTER — Telehealth: Payer: Self-pay | Admitting: Adult Health

## 2013-08-21 ENCOUNTER — Ambulatory Visit (HOSPITAL_BASED_OUTPATIENT_CLINIC_OR_DEPARTMENT_OTHER): Payer: Medicare Other

## 2013-08-21 VITALS — BP 118/71 | HR 72 | Temp 98.2°F | Resp 18 | Ht 66.0 in | Wt 187.5 lb

## 2013-08-21 DIAGNOSIS — C50119 Malignant neoplasm of central portion of unspecified female breast: Secondary | ICD-10-CM

## 2013-08-21 DIAGNOSIS — Z170 Estrogen receptor positive status [ER+]: Secondary | ICD-10-CM

## 2013-08-21 DIAGNOSIS — Z5112 Encounter for antineoplastic immunotherapy: Secondary | ICD-10-CM

## 2013-08-21 DIAGNOSIS — G589 Mononeuropathy, unspecified: Secondary | ICD-10-CM

## 2013-08-21 DIAGNOSIS — C50111 Malignant neoplasm of central portion of right female breast: Secondary | ICD-10-CM

## 2013-08-21 LAB — COMPREHENSIVE METABOLIC PANEL (CC13)
ALT: 16 U/L (ref 0–55)
AST: 14 U/L (ref 5–34)
Albumin: 4 g/dL (ref 3.5–5.0)
Alkaline Phosphatase: 75 U/L (ref 40–150)
Anion Gap: 6 mEq/L (ref 3–11)
BILIRUBIN TOTAL: 0.45 mg/dL (ref 0.20–1.20)
BUN: 15.8 mg/dL (ref 7.0–26.0)
CHLORIDE: 109 meq/L (ref 98–109)
CO2: 28 mEq/L (ref 22–29)
CREATININE: 0.8 mg/dL (ref 0.6–1.1)
Calcium: 9.7 mg/dL (ref 8.4–10.4)
Glucose: 109 mg/dl (ref 70–140)
Potassium: 4.2 mEq/L (ref 3.5–5.1)
Sodium: 143 mEq/L (ref 136–145)
Total Protein: 6.1 g/dL — ABNORMAL LOW (ref 6.4–8.3)

## 2013-08-21 LAB — CBC WITH DIFFERENTIAL/PLATELET
BASO%: 0.7 % (ref 0.0–2.0)
BASOS ABS: 0 10*3/uL (ref 0.0–0.1)
EOS%: 3.8 % (ref 0.0–7.0)
Eosinophils Absolute: 0.3 10*3/uL (ref 0.0–0.5)
HEMATOCRIT: 36.4 % (ref 34.8–46.6)
HEMOGLOBIN: 12.1 g/dL (ref 11.6–15.9)
LYMPH%: 22.3 % (ref 14.0–49.7)
MCH: 30.7 pg (ref 25.1–34.0)
MCHC: 33.3 g/dL (ref 31.5–36.0)
MCV: 92.4 fL (ref 79.5–101.0)
MONO#: 0.5 10*3/uL (ref 0.1–0.9)
MONO%: 7.7 % (ref 0.0–14.0)
NEUT#: 4.3 10*3/uL (ref 1.5–6.5)
NEUT%: 65.5 % (ref 38.4–76.8)
PLATELETS: 224 10*3/uL (ref 145–400)
RBC: 3.94 10*6/uL (ref 3.70–5.45)
RDW: 14.8 % — ABNORMAL HIGH (ref 11.2–14.5)
WBC: 6.6 10*3/uL (ref 3.9–10.3)
lymph#: 1.5 10*3/uL (ref 0.9–3.3)

## 2013-08-21 MED ORDER — SODIUM CHLORIDE 0.9 % IV SOLN
Freq: Once | INTRAVENOUS | Status: AC
  Administered 2013-08-21: 11:00:00 via INTRAVENOUS

## 2013-08-21 MED ORDER — HEPARIN SOD (PORK) LOCK FLUSH 100 UNIT/ML IV SOLN
500.0000 [IU] | Freq: Once | INTRAVENOUS | Status: AC | PRN
  Administered 2013-08-21: 500 [IU]
  Filled 2013-08-21: qty 5

## 2013-08-21 MED ORDER — SODIUM CHLORIDE 0.9 % IJ SOLN
10.0000 mL | INTRAMUSCULAR | Status: DC | PRN
  Administered 2013-08-21: 10 mL
  Filled 2013-08-21: qty 10

## 2013-08-21 MED ORDER — SODIUM CHLORIDE 0.9 % IV SOLN
6.0000 mg/kg | Freq: Once | INTRAVENOUS | Status: AC
  Administered 2013-08-21: 483 mg via INTRAVENOUS
  Filled 2013-08-21: qty 23

## 2013-08-21 MED ORDER — ACETAMINOPHEN 325 MG PO TABS
ORAL_TABLET | ORAL | Status: AC
  Filled 2013-08-21: qty 2

## 2013-08-21 MED ORDER — ACETAMINOPHEN 325 MG PO TABS
650.0000 mg | ORAL_TABLET | Freq: Once | ORAL | Status: AC
  Administered 2013-08-21: 650 mg via ORAL

## 2013-08-21 NOTE — Progress Notes (Signed)
Pt requested no benadryl.

## 2013-08-21 NOTE — Telephone Encounter (Signed)
Per staff message and POF I have scheduled appts. Advised scheduler of appts. JMW  

## 2013-08-21 NOTE — Telephone Encounter (Signed)
per pof to sch pt appt-sent email to MW to sch trmts-pt will get updated sch before leaving trmt room

## 2013-08-21 NOTE — Patient Instructions (Signed)
You are doing well today.  You have no sign of recurrence.  Continue taking Letrozole daily.  You will receive Herceptin today. Continue with a healthy diet, exercise, and self breast exams.  Please call us if you have any questions or concerns.    Breast Self-Awareness Practicing breast self-awareness may pick up problems early, prevent significant medical complications, and possibly save your life. By practicing breast self-awareness, you can become familiar with how your breasts look and feel and if your breasts are changing. This allows you to notice changes early. It can also offer you some reassurance that your breast health is good. One way to learn what is normal for your breasts and whether your breasts are changing is to do a breast self-exam. If you find a lump or something that was not present in the past, it is best to contact your caregiver right away. Other findings that should be evaluated by your caregiver include nipple discharge, especially if it is bloody; skin changes or reddening; areas where the skin seems to be pulled in (retracted); or new lumps and bumps. Breast pain is seldom associated with cancer (malignancy), but should also be evaluated by a caregiver. HOW TO PERFORM A BREAST SELF-EXAM The best time to examine your breasts is 5-7 days after your menstrual period is over. During menstruation, the breasts are lumpier, and it may be more difficult to pick up changes. If you do not menstruate, have reached menopause, or had your uterus removed (hysterectomy), you should examine your breasts at regular intervals, such as monthly. If you are breastfeeding, examine your breasts after a feeding or after using a breast pump. Breast implants do not decrease the risk for lumps or tumors, so continue to perform breast self-exams as recommended. Talk to your caregiver about how to determine the difference between the implant and breast tissue. Also, talk about the amount of pressure you  should use during the exam. Over time, you will become more familiar with the variations of your breasts and more comfortable with the exam. A breast self-exam requires you to remove all your clothes above the waist. 1. Look at your breasts and nipples. Stand in front of a mirror in a room with good lighting. With your hands on your hips, push your hands firmly downward. Look for a difference in shape, contour, and size from one breast to the other (asymmetry). Asymmetry includes puckers, dips, or bumps. Also, look for skin changes, such as reddened or scaly areas on the breasts. Look for nipple changes, such as discharge, dimpling, repositioning, or redness. 2. Carefully feel your breasts. This is best done either in the shower or tub while using soapy water or when flat on your back. Place the arm (on the side of the breast you are examining) above your head. Use the pads (not the fingertips) of your three middle fingers on your opposite hand to feel your breasts. Start in the underarm area and use  inch (2 cm) overlapping circles to feel your breast. Use 3 different levels of pressure (light, medium, and firm pressure) at each circle before moving to the next circle. The light pressure is needed to feel the tissue closest to the skin. The medium pressure will help to feel breast tissue a little deeper, while the firm pressure is needed to feel the tissue close to the ribs. Continue the overlapping circles, moving downward over the breast until you feel your ribs below your breast. Then, move one finger-width towards the center  of the body. Continue to use the  inch (2 cm) overlapping circles to feel your breast as you move slowly up toward the collar bone (clavicle) near the base of the neck. Continue the up and down exam using all 3 pressures until you reach the middle of the chest. Do this with each breast, carefully feeling for lumps or changes. 3.  Keep a written record with breast changes or normal  findings for each breast. By writing this information down, you do not need to depend only on memory for size, tenderness, or location. Write down where you are in your menstrual cycle, if you are still menstruating. Breast tissue can have some lumps or thick tissue. However, see your caregiver if you find anything that concerns you.  SEEK MEDICAL CARE IF:  You see a change in shape, contour, or size of your breasts or nipples.   You see skin changes, such as reddened or scaly areas on the breasts or nipples.   You have an unusual discharge from your nipples.   You feel a new lump or unusually thick areas.  Document Released: 01/31/2005 Document Revised: 01/18/2012 Document Reviewed: 05/18/2011 [facility] Patient Information 2015 [place], [place]. This information is not intended to replace advice given to you by your health care provider. Make sure you discuss any questions you have with your health care provider.

## 2013-08-21 NOTE — Progress Notes (Signed)
ID: [name] OB: Sep 02, 1940  MR#: [id number]  HUD#:[id number]  PCP: Haywood Pao, MD GYN:   SU: Dr Donne Hazel OTHER MD:  Dr. Isidore Moos: radiation oncology  CHIEF COMPLAINT: Patient with right breast triple positive invasive mammary here for evaluation prior to herceptin.    BREAST CANCER HISTORY:  The patient palpated a mass approximately 6 months after her annual mammogram. Diagnostic mammogram on 09/11/2012 showed a 7 mm mass at the 12 o'clock position. Ultrasound showed the mass to be 7 mm at the 12 o'clock position. Right breast needle core biopsy of the 12 o'clock position on 09/12/2012 showed invasive mammary carcinoma with lobular features, grade 2, estrogen receptor 100% positive, progesterone receptor 100% positive, Ki-67 45%, HER-2/neu by CISH showed amplification with ratio 2.96. Bilateral breast MRI on 09/18/2012 showed a 1.5 x 1.7 x 1.5 cm irregular mass in the right breast with no mass or abnormal enhancements in the left breast or lymph nodes (clinical stage I, T1 N0).  CURRENT THERAPY: Letrozole daily, Herceptin every three weeks  INTERVAL HISTORY:  Angela Rosario is here today for evaluation prior to receiving Herceptin therapy.  She is doing well today.  She continues to take Letrozole daily and is tolerating it moderately well. She is wiping her vaginal area with olive oil daily to help with vaginal dryness.  She does have some soreness during the range of motion in her right arm.  Her neuropathy is stable.  She is taking Gabapentin 338m QHS.  Otherwise, she denies fevers, chills, nausea, vomiting, constipation, diarrhea, chest pain, palpitations, DOE, orthopnea, or any further concerns.    REVIEW OF SYSTEMS: A 10 point review of systems was conducted and is otherwise negative except for what is noted above.     PAST MEDICAL HISTORY: Past Medical History  Diagnosis Date  . Syncope   . Diastolic dysfunction   . Hyperlipidemia   . Episode of dizziness     Mild  episodes  . Orthostatic hypotension     Some episodes  . Arthritis     oa  . Stress incontinence, female     wears pads  . AC (acromioclavicular) joint bone spurs     bone spurs in neck  . 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 Diabetes mellitus     niddm; last A1C 6.2  . Hypertension   . Wears glasses   . Breast cancer   . Skin cancer     multiple sites (neck, nose, etc.)  . Carcinoma of breast treated with adjuvant chemotherapy      Three weeks of Herceptin therapy that started on 11/19/2012 and adjuvant chemotherapy consisting of [place] on 12/10/2012. She completed therapy [place] on 02/18/13 and began adjuvant every 3 week Herceptin on 02/25/13.   . S/P radiation therapy  04/01/2013-05/14/2013    1) Right breast / 50 Gy in 25 fractions, 2) Right breast boost / 10 Gy in 5 fractions    PAST SURGICAL HISTORY: Past Surgical History  Procedure Laterality Date  . Other surgical history      Hysterectomy  . Cholecystectomy  1992  . Abdominal hysterectomy  1985    1 ovary removed  . Total hip arthroplasty  02/23/2011    Procedure: TOTAL HIP ARTHROPLASTY;  Surgeon: FDione PloverAluisio;  Location: [facility];  Service: Orthopedics;  Laterality: Left;  . Pubovaginal sling  02/03/2012    Procedure: PGaynelle Arabian  Surgeon: SFranchot Gallo MD;  [facility];  Service: Urology;  Laterality: N/A;  1 HR  LYNX SLING  . Tonsillectomy    . Breast lumpectomy with needle localization and axillary sentinel lymph node bx Right 10/04/2012    Procedure: RIGHT BREAST LUMPECTOMY WITH NEEDLE LOCALIZATION AND AXILLARY SENTINEL LYMPH NODE BX;  Surgeon: [name], MD;  Location: [place];  Service: General;  Laterality: Right;  . Portacath placement Left 10/04/2012    Procedure: INSERTION PORT-A-CATH;  Surgeon: [name], MD;  Location: [place];  Service: General;  Laterality: Left;  . Orif wrist fracture  Left 11/06/2012    Procedure: LEFT OPEN REDUCTION INTERNAL FIXATION (ORIF) DISTAL RADIUS WRIST FRACTURE;  Surgeon: Tennis Must, MD;  Location: [place] [facility];  Service: Orthopedics;  Laterality: Left;    FAMILY HISTORY Family History  Problem Relation Age of Onset  . Lung cancer Maternal Uncle   . Breast cancer Paternal Aunt     dx <50  . Breast cancer Paternal Uncle 40  . Prostate cancer Cousin     paternal cousin    GYNECOLOGIC HISTORY: s/p TAH, g2p2, was on oral contraceptives previously without difficulty, was on HRT for 10 years without problems, s/p TAH, in [place]: Lives alone in a house in [place], [place], is retired.  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    ADVANCED DIRECTIVES: In place.  Health care power of attorney is her son Angela Rosario.     HEALTH MAINTENANCE: History  Substance Use Topics  . Smoking status: Former Smoker -- 0.25 packs/day for 1 years    Quit date: 02/14/1957  . Smokeless tobacco: Never Used  . Alcohol Use: No      Mammogram: Due 10/2013 Colonoscopy: 2011 with 13 year f/u Bone Density Scan: 05/2013 normal Pap Smear: s/p TAH Eye Exam: 06/2013 Lipid Panel: 05/2013   Allergies  Allergen Reactions  . Oxycodone Other (See Comments)    hallucinations    Current Outpatient Prescriptions  Medication Sig Dispense Refill  . carvedilol (COREG) 6.25 MG tablet Take 1.5 tablets (9.375 mg total) by mouth 2 (two) times daily with a meal.  270 tablet  1  . gabapentin (NEURONTIN) 100 MG capsule Take 4 capsules (400 mg total) by mouth at bedtime.  120 capsule  6  . glyBURIDE-metformin (GLUCOVANCE) 2.5-500 MG per tablet Take 1 tablet by mouth daily with breakfast.       . letrozole (FEMARA) 2.5 MG tablet Take 1 tablet (2.5 mg total) by mouth daily.  30 tablet  3  . lidocaine-prilocaine (EMLA) cream Apply topically as needed.  30 g  1  . losartan (COZAAR) 25 MG tablet Take 1 tablet (25 mg total) by mouth daily.  30 tablet   6  . lovastatin (MEVACOR) 10 MG tablet Take 10 mg by mouth at bedtime.       . meloxicam (MOBIC) 7.5 MG tablet Take 7.5 mg by mouth 2 (two) times daily. Take 1 tablet  By mouth twice daily      . Multiple Vitamin (MULTIVITAMIN) capsule Take 1 capsule by mouth daily.      Marland Kitchen tretinoin (RETIN-A) 0.025 % cream Apply topically at bedtime.      . B Complex-C (SUPER B COMPLEX PO) Take 1 tablet by mouth every morning.       No current facility-administered medications for this visit.   Facility-Administered Medications Ordered in Other Visits  Medication Dose Route Frequency Provider Last Rate Last  Dose  . gentamicin (GARAMYCIN) 160 mg in dextrose 5 % 50 mL IVPB  160 mg Intravenous 30 min Pre-Op Jorja Loa, MD        OBJECTIVE: Filed Vitals:   08/21/13 1016  BP: 118/71  Pulse: 72  Temp: 98.2 F (36.8 C)  Resp: 18     Body mass index is 30.28 kg/(m^2).     GENERAL: Patient is a well appearing female in no acute distress HEENT:  Sclerae anicteric.  Oropharynx clear and moist. No ulcerations or evidence of oropharyngeal candidiasis. Neck is supple.  NODES:  No cervical, supraclavicular, or axillary lymphadenopathy palpated.  BREAST EXAM:  Right breast lumpectomy site without nodularity, right nipple is crusting.  Left breast without masses, nodules or lesions.  Benign bilateral breast exam. LUNGS:  Clear to auscultation bilaterally.  No wheezes or rhonchi. HEART:  Regular rate and rhythm. No murmur appreciated. ABDOMEN:  Soft, nontender.  Positive, normoactive bowel sounds. No organomegaly palpated. MSK:  No focal spinal tenderness to palpation. Full range of motion bilaterally in the upper extremities. EXTREMITIES:  No peripheral edema.   SKIN:  Clear with no obvious rashes or skin changes. No nail dyscrasia. NEURO:  Nonfocal. Well oriented.  Appropriate affect. ECOG FS:0 - Asymptomatic  LAB RESULTS:  CMP     Component Value Date/Time   NA 143 08/21/2013 1002   NA 135  11/01/2012 1350   K 4.2 08/21/2013 1002   K 4.2 11/01/2012 1350   CL 96 11/01/2012 1350   CO2 28 08/21/2013 1002   CO2 26 11/01/2012 1350   GLUCOSE 109 08/21/2013 1002   GLUCOSE 120* 11/01/2012 1350   BUN 15.8 08/21/2013 1002   BUN 10 11/01/2012 1350   CREATININE 0.8 08/21/2013 1002   CREATININE 0.64 11/01/2012 1350   CALCIUM 9.7 08/21/2013 1002   CALCIUM 9.7 11/01/2012 1350   PROT 6.1* 08/21/2013 1002   PROT 7.3 02/17/2011 1045   ALBUMIN 4.0 08/21/2013 1002   ALBUMIN 4.3 02/17/2011 1045   AST 14 08/21/2013 1002   AST 26 02/17/2011 1045   ALT 16 08/21/2013 1002   ALT 36* 02/17/2011 1045   ALKPHOS 75 08/21/2013 1002   ALKPHOS 77 02/17/2011 1045   BILITOT 0.45 08/21/2013 1002   BILITOT 0.5 02/17/2011 1045   GFRNONAA 87* 11/01/2012 1350   GFRAA >90 11/01/2012 1350    I No results found for this basename: SPEP,  UPEP,   kappa and lambda light chains    Lab Results  Component Value Date   WBC 6.6 08/21/2013   NEUTROABS 4.3 08/21/2013   HGB 12.1 08/21/2013   HCT 36.4 08/21/2013   MCV 92.4 08/21/2013   PLT 224 08/21/2013      Chemistry      Component Value Date/Time   NA 143 08/21/2013 1002   NA 135 11/01/2012 1350   K 4.2 08/21/2013 1002   K 4.2 11/01/2012 1350   CL 96 11/01/2012 1350   CO2 28 08/21/2013 1002   CO2 26 11/01/2012 1350   BUN 15.8 08/21/2013 1002   BUN 10 11/01/2012 1350   CREATININE 0.8 08/21/2013 1002   CREATININE 0.64 11/01/2012 1350      Component Value Date/Time   CALCIUM 9.7 08/21/2013 1002   CALCIUM 9.7 11/01/2012 1350   ALKPHOS 75 08/21/2013 1002   ALKPHOS 77 02/17/2011 1045   AST 14 08/21/2013 1002   AST 26 02/17/2011 1045   ALT 16 08/21/2013 1002   ALT 36* 02/17/2011 1045  BILITOT 0.45 08/21/2013 1002   BILITOT 0.5 02/17/2011 1045       No results found for this basename: LABCA2    No components found with this basename: AJGOT157    No results found for this basename: INR,  in the last 168 hours  Urinalysis    Component Value Date/Time   COLORURINE YELLOW 02/17/2011 [place] 02/17/2011 1049    LABSPEC 1.030 01/28/2013 1537   LABSPEC 1.025 02/17/2011 1049   PHURINE 6.5 02/17/2011 1049   GLUCOSEU Negative 01/28/2013 1537   GLUCOSEU NEGATIVE 02/17/2011 [place] 02/17/2011 [place] 02/17/2011 1049   KETONESUR NEGATIVE 02/17/2011 1049   PROTEINUR NEGATIVE 02/17/2011 1049   UROBILINOGEN 0.2 01/28/2013 1537   UROBILINOGEN 1.0 02/17/2011 1049   NITRITE NEGATIVE 02/17/2011 1049   LEUKOCYTESUR SMALL* 02/17/2011 1049    STUDIES: No results found.  ASSESSMENT: 73 y.o. , [place] woman with T1 N0, stage IA right breast invasive ductal carcinoma, grade II, ER 100%, PR 100%, Ki-67 45%, HER-2/neu positive.    1. Status post right breast lumpectomy with right axillary sentinel lymph node biopsy on 10/04/2012 for a stage I, pT1c, pN0, MX, 1.6 cm invasive ductal carcinoma, grade 2 with associated intermediate grade ductal carcinoma in situ, tumor involved the dermis and lymphovascular invasion was identified, close to anterior margin, other margins were negative, estrogen receptor 100% positive, progesterone receptor 100% positive, Ki-67 45%, HER-2/neu amplified, with 0/5 metastatic right axillary lymph nodes.   2. Adjuvant chemotherapy consisting of [place] (Taxotere/Carboplatin/Herceptin) was originally scheduled to start on 11/19/2012 however the patient suffered a fall on 11/06/2012 and subsequently underwent arm fracture repair surgery. She received three weeks of Herceptin therapy that started on 11/19/2012 and adjuvant chemotherapy consisting of [place] started on 12/10/2012. She completed therapy [place] therapy on 02/18/13 and began adjuvant every 3 week Herceptin on 02/25/13.   3. Adjuvant radiation therapy with Dr. Isidore Moos from 04/01/13 through 05/14/13.     PLAN:   Angela Rosario is doing well today.  She has no sign of recurrence.  Her CBC is stable.  I reviewed it with her in detail.  Her CMP is pending.  She will proceed with Herceptin today.  She is tolerating the Letrozole well.   She will continue taking this daily.    We reviewed her health maintenance, gynecologic history, social history and Advance Directives today above.  We discussed survivorship in detail.  I recommended healthy diet, exercise, and monthly self breast exams.    She is taking Gabapentin for her neuropathy.  It is stable.  She is taking 32m at night.  On her breast exam she does have some crusting over her right nipple that appeared about a week ago.  She does have f/u with Dr. WDonne Hazelon Friday, 08/21/13 and I did ask her to show it to him.    The patient will return every 3 weeks for Herceptin therapy and on 10/23/13 for labs, evaluation, and her final Herceptin therapy.   She knows to call [facility] the interim for any questions or concerns.  We can certainly see her sooner if needed.  I spent 25 minutes counseling the patient face to face.  The total time spent in the appointment was 30 minutes.  LMinette Headland [id number]/09/2013 10:58 AM

## 2013-08-21 NOTE — Patient Instructions (Signed)

## 2013-08-23 ENCOUNTER — Ambulatory Visit (INDEPENDENT_AMBULATORY_CARE_PROVIDER_SITE_OTHER): Payer: Medicare Other | Admitting: General Surgery

## 2013-08-23 ENCOUNTER — Encounter (INDEPENDENT_AMBULATORY_CARE_PROVIDER_SITE_OTHER): Payer: Self-pay | Admitting: General Surgery

## 2013-08-23 VITALS — BP 104/62 | HR 72 | Resp 18 | Ht 66.0 in | Wt 188.0 lb

## 2013-08-23 DIAGNOSIS — C50111 Malignant neoplasm of central portion of right female breast: Secondary | ICD-10-CM

## 2013-08-23 DIAGNOSIS — C50119 Malignant neoplasm of central portion of unspecified female breast: Secondary | ICD-10-CM

## 2013-08-23 NOTE — Progress Notes (Signed)
Subjective:     Patient ID: Angela Rosario, female   DOB: Jul 16, 1940, 73 y.o.   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  She is undergoing herceptin still.  This is due to be complete 9 September.  She is on letrozole and that is going fiarly well.  She does have some soreness in her right arm and pain in axilla. Was not able to see pt before due to other therapies.  She also has had some crusting on her right nipple for 2-3 weeks but no discharge.    Review of Systems     Objective:   Physical Exam  Vitals reviewed. Constitutional: She appears well-developed and well-nourished.  Pulmonary/Chest: Right breast exhibits skin change (there is area on nipple that appears scaly, there are also areas on areola and several on skin of breast that are similar to this). Right breast exhibits no inverted nipple, no mass, no nipple discharge and no tenderness. Left breast exhibits no inverted nipple, no mass, no nipple discharge, no skin change and no tenderness.    Lymphadenopathy:    She has no cervical adenopathy.    She has no axillary adenopathy.       Right: No supraclavicular adenopathy present.       Left: No supraclavicular adenopathy present.       Assessment:     Stage I breast cancer    Plan:     I do not think she has a recurrence or pagets (which was her fear).  There are a number of areas on her breast that are like this and more likely from treatment.  We are going to keep an eye on this and I will have her see me again in 6-8 weeks.

## 2013-08-26 ENCOUNTER — Ambulatory Visit: Payer: Medicare Other | Admitting: Adult Health

## 2013-08-26 ENCOUNTER — Ambulatory Visit: Payer: Medicare Other

## 2013-08-26 ENCOUNTER — Other Ambulatory Visit: Payer: Medicare Other

## 2013-08-30 ENCOUNTER — Ambulatory Visit: Payer: Medicare Other | Attending: General Surgery | Admitting: Physical Therapy

## 2013-08-30 DIAGNOSIS — M25519 Pain in unspecified shoulder: Secondary | ICD-10-CM | POA: Diagnosis not present

## 2013-08-30 DIAGNOSIS — Z923 Personal history of irradiation: Secondary | ICD-10-CM | POA: Diagnosis not present

## 2013-08-30 DIAGNOSIS — IMO0001 Reserved for inherently not codable concepts without codable children: Secondary | ICD-10-CM | POA: Insufficient documentation

## 2013-08-30 DIAGNOSIS — Z9221 Personal history of antineoplastic chemotherapy: Secondary | ICD-10-CM | POA: Diagnosis not present

## 2013-08-30 DIAGNOSIS — M25569 Pain in unspecified knee: Secondary | ICD-10-CM | POA: Diagnosis not present

## 2013-08-30 DIAGNOSIS — H3552 Pigmentary retinal dystrophy: Secondary | ICD-10-CM | POA: Insufficient documentation

## 2013-08-30 DIAGNOSIS — C50919 Malignant neoplasm of unspecified site of unspecified female breast: Secondary | ICD-10-CM | POA: Diagnosis not present

## 2013-09-06 ENCOUNTER — Other Ambulatory Visit: Payer: Self-pay | Admitting: Cardiology

## 2013-09-06 ENCOUNTER — Other Ambulatory Visit: Payer: Self-pay | Admitting: Adult Health

## 2013-09-10 ENCOUNTER — Other Ambulatory Visit: Payer: Self-pay | Admitting: *Deleted

## 2013-09-10 ENCOUNTER — Other Ambulatory Visit: Payer: Self-pay | Admitting: Cardiology

## 2013-09-10 DIAGNOSIS — C50119 Malignant neoplasm of central portion of unspecified female breast: Secondary | ICD-10-CM

## 2013-09-10 NOTE — Progress Notes (Signed)
Pt called with c/o Left behind the knee pain. Reports no redness or warmth to touch. Pt unable to differentiate is swelling was noted. Spoke to Ross Stores, NP- she ordered doppler and will see pt in Chemo during herceptin treatment. Informed pt of instructions. Received verbal understanding. Pt denies further needs at this time.

## 2013-09-11 ENCOUNTER — Other Ambulatory Visit: Payer: Self-pay | Admitting: Oncology

## 2013-09-11 ENCOUNTER — Other Ambulatory Visit (HOSPITAL_BASED_OUTPATIENT_CLINIC_OR_DEPARTMENT_OTHER): Payer: Medicare Other

## 2013-09-11 ENCOUNTER — Ambulatory Visit (HOSPITAL_BASED_OUTPATIENT_CLINIC_OR_DEPARTMENT_OTHER): Payer: Medicare Other | Admitting: Nurse Practitioner

## 2013-09-11 ENCOUNTER — Ambulatory Visit (HOSPITAL_COMMUNITY)
Admission: RE | Admit: 2013-09-11 | Discharge: 2013-09-11 | Disposition: A | Discharge status: Home / Self Care | Payer: Medicare Other | Source: Ambulatory Visit | Attending: Nurse Practitioner | Admitting: Nurse Practitioner

## 2013-09-11 ENCOUNTER — Ambulatory Visit (HOSPITAL_BASED_OUTPATIENT_CLINIC_OR_DEPARTMENT_OTHER): Payer: Medicare Other

## 2013-09-11 VITALS — BP 111/59 | HR 65 | Temp 98.0°F | Resp 18

## 2013-09-11 DIAGNOSIS — M79609 Pain in unspecified limb: Secondary | ICD-10-CM

## 2013-09-11 DIAGNOSIS — C50119 Malignant neoplasm of central portion of unspecified female breast: Secondary | ICD-10-CM

## 2013-09-11 DIAGNOSIS — C50112 Malignant neoplasm of central portion of left female breast: Secondary | ICD-10-CM

## 2013-09-11 DIAGNOSIS — Z5112 Encounter for antineoplastic immunotherapy: Secondary | ICD-10-CM

## 2013-09-11 DIAGNOSIS — M79605 Pain in left leg: Secondary | ICD-10-CM

## 2013-09-11 DIAGNOSIS — C50111 Malignant neoplasm of central portion of right female breast: Secondary | ICD-10-CM

## 2013-09-11 LAB — CBC WITH DIFFERENTIAL/PLATELET
BASO%: 0.4 % (ref 0.0–2.0)
BASOS ABS: 0 10*3/uL (ref 0.0–0.1)
EOS ABS: 0.3 10*3/uL (ref 0.0–0.5)
EOS%: 3.6 % (ref 0.0–7.0)
HCT: 36.9 % (ref 34.8–46.6)
HEMOGLOBIN: 12.3 g/dL (ref 11.6–15.9)
LYMPH#: 1.7 10*3/uL (ref 0.9–3.3)
LYMPH%: 23.1 % (ref 14.0–49.7)
MCH: 30.9 pg (ref 25.1–34.0)
MCHC: 33.4 g/dL (ref 31.5–36.0)
MCV: 92.5 fL (ref 79.5–101.0)
MONO#: 0.6 10*3/uL (ref 0.1–0.9)
MONO%: 7.6 % (ref 0.0–14.0)
NEUT%: 65.3 % (ref 38.4–76.8)
NEUTROS ABS: 4.7 10*3/uL (ref 1.5–6.5)
Platelets: 219 10*3/uL (ref 145–400)
RBC: 3.99 10*6/uL (ref 3.70–5.45)
RDW: 14.5 % (ref 11.2–14.5)
WBC: 7.3 10*3/uL (ref 3.9–10.3)

## 2013-09-11 LAB — COMPREHENSIVE METABOLIC PANEL (CC13)
ALT: 10 U/L (ref 0–55)
ANION GAP: 9 meq/L (ref 3–11)
AST: 14 U/L (ref 5–34)
Albumin: 3.9 g/dL (ref 3.5–5.0)
Alkaline Phosphatase: 72 U/L (ref 40–150)
BUN: 15.5 mg/dL (ref 7.0–26.0)
CALCIUM: 9.6 mg/dL (ref 8.4–10.4)
CHLORIDE: 110 meq/L — AB (ref 98–109)
CO2: 26 mEq/L (ref 22–29)
Creatinine: 0.8 mg/dL (ref 0.6–1.1)
GLUCOSE: 121 mg/dL (ref 70–140)
Potassium: 4.3 mEq/L (ref 3.5–5.1)
Sodium: 145 mEq/L (ref 136–145)
Total Bilirubin: 0.66 mg/dL (ref 0.20–1.20)
Total Protein: 6.1 g/dL — ABNORMAL LOW (ref 6.4–8.3)

## 2013-09-11 MED ORDER — TRASTUZUMAB CHEMO INJECTION 440 MG
6.0000 mg/kg | Freq: Once | INTRAVENOUS | Status: AC
  Administered 2013-09-11: 483 mg via INTRAVENOUS
  Filled 2013-09-11: qty 23

## 2013-09-11 MED ORDER — SODIUM CHLORIDE 0.9 % IJ SOLN
10.0000 mL | INTRAMUSCULAR | Status: DC | PRN
  Administered 2013-09-11: 10 mL
  Filled 2013-09-11: qty 10

## 2013-09-11 MED ORDER — ACETAMINOPHEN 325 MG PO TABS
ORAL_TABLET | ORAL | Status: AC
  Filled 2013-09-11: qty 2

## 2013-09-11 MED ORDER — SODIUM CHLORIDE 0.9 % IV SOLN
Freq: Once | INTRAVENOUS | Status: AC
  Administered 2013-09-11: 10:00:00 via INTRAVENOUS

## 2013-09-11 MED ORDER — ACETAMINOPHEN 325 MG PO TABS
650.0000 mg | ORAL_TABLET | Freq: Once | ORAL | Status: AC
  Administered 2013-09-11: 650 mg via ORAL

## 2013-09-11 MED ORDER — HEPARIN SOD (PORK) LOCK FLUSH 100 UNIT/ML IV SOLN
500.0000 [IU] | Freq: Once | INTRAVENOUS | Status: AC | PRN
  Administered 2013-09-11: 500 [IU]
  Filled 2013-09-11: qty 5

## 2013-09-11 NOTE — Patient Instructions (Signed)
[facility] Discharge Instructions for Patients Receiving Chemotherapy  Today you received the following chemotherapy agents: Herceptin  To help prevent nausea and vomiting after your treatment, we encourage you to take your nausea medication as prescribed by your physician.    If you develop nausea and vomiting that is not controlled by your nausea medication, call the clinic.   BELOW ARE SYMPTOMS THAT SHOULD BE REPORTED IMMEDIATELY:  *FEVER GREATER THAN 100.5 F  *CHILLS WITH OR WITHOUT FEVER  NAUSEA AND VOMITING THAT IS NOT CONTROLLED WITH YOUR NAUSEA MEDICATION  *UNUSUAL SHORTNESS OF BREATH  *UNUSUAL BRUISING OR BLEEDING  TENDERNESS IN MOUTH AND THROAT WITH OR WITHOUT PRESENCE OF ULCERS  *URINARY PROBLEMS  *BOWEL PROBLEMS  UNUSUAL RASH Items with * indicate a potential emergency and should be followed up as soon as possible.  Feel free to call the clinic you have any questions or concerns. The clinic phone number is [phone].    

## 2013-09-11 NOTE — Progress Notes (Signed)
*  PRELIMINARY RESULTS* Vascular Ultrasound Left lower extremity venous duplex has been completed.  Preliminary findings: Left:  No evidence of DVT, superficial thrombosis, or Baker's cyst.  Called results to Drue Second, NP.  Landry Mellow, RDMS, RVT  09/11/2013, 11:55 AM

## 2013-09-11 NOTE — Progress Notes (Signed)
Patient refusing benadryl, as she is driving today. Tylenol given for premeds. Patient reports having pain behind left knee, states this has been going on for a couple of months. Angela Lesser NP to see patient at chairside.

## 2013-09-12 ENCOUNTER — Encounter: Payer: Self-pay | Admitting: Nurse Practitioner

## 2013-09-12 DIAGNOSIS — M79606 Pain in leg, unspecified: Secondary | ICD-10-CM | POA: Insufficient documentation

## 2013-09-12 NOTE — Assessment & Plan Note (Signed)
Left lower extremity pain specifically at to the area posterior to the knee with no evidence of cellulitis/infection, injury, or trauma.  Patient does have some fairly significant varicose veins at this specific area of discomfort.  All pulses are palpable in all extremities are warm.  Patient was observed with full range of motion to all extremities as well.  Doppler ultrasound obtained today revealed no evidence of DVT or Baker cyst to the left lower extremity.  Patient was encouraged to try ibuprofen on an as-needed basis.  I also advised her to call if her symptoms continue or worsen.

## 2013-09-12 NOTE — Progress Notes (Signed)
[facility]   Chief Complaint  Patient presents with  . Leg Pain    HPI: Angela Rosario 73 y.o. female diagnosed with breast cancer.  Currently undergoing Mitrazol on daily basis; and receive Herceptin infusion on an every three-week basis.  Her today to receive her next Herceptin infusion.  Patient called the cancer Center requesting evaluation of some chronic left posterior knee discomfort.  She states this pain has been present for approximately 2 months; but has increased over the past few weeks.  She denies any known trauma or injury.  She denies any worsening of pain with ambulation or exertion.  She denies any chest pain, chest pressure, or shortness of breath.  She denies any recent fever or chills.   Leg Pain  The incident occurred more than 1 week ago. The injury mechanism is unknown. The pain is present in the left knee. The quality of the pain is described as aching. The pain is at a severity of 3/10. The pain is mild. The pain has been worsening since onset. Pertinent negatives include no inability to bear weight, loss of motion, loss of sensation, muscle weakness, numbness or tingling. Nothing aggravates the symptoms. She has tried nothing for the symptoms.    CURRENT THERAPY: Letrozole on a daily basis; and Herceptin every 3 weeks.  Review of Systems  Neurological: Negative for tingling and numbness.    Past Medical History  Diagnosis Date  . Syncope   . Diastolic dysfunction   . Hyperlipidemia   . Episode of dizziness     Mild episodes  . Orthostatic hypotension     Some episodes  . Arthritis     oa  . Stress incontinence, female     wears pads  . AC (acromioclavicular) joint bone spurs     bone spurs in neck  . 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 Diabetes mellitus     niddm; last A1C 6.2  . Hypertension   . Wears glasses   . Breast cancer   . Skin cancer     multiple sites (neck, nose, etc.)  . Carcinoma of  breast treated with adjuvant chemotherapy      Three weeks of Herceptin therapy that started on 11/19/2012 and adjuvant chemotherapy consisting of [place] started on 12/10/2012. She completed therapy [place] therapy on 02/18/13 and began adjuvant every 3 week Herceptin on 02/25/13.   . S/P radiation therapy  04/01/2013-05/14/2013    1) Right breast / 50 Gy in 25 fractions, 2) Right breast boost / 10 Gy in 5 fractions    Past Surgical History  Procedure Laterality Date  . Other surgical history      Hysterectomy  . Cholecystectomy  1992  . Abdominal hysterectomy  1985    1 ovary removed  . Total hip arthroplasty  02/23/2011    Procedure: TOTAL HIP ARTHROPLASTY;  Surgeon: Dione Plover Aluisio;  Location: [facility];  Service: Orthopedics;  Laterality: Left;  . Pubovaginal sling  02/03/2012    Procedure: Gaynelle Arabian;  Surgeon: Franchot Gallo, MD;  Location: [facility];  Service: Urology;  Laterality: N/A;  1 HR  LYNX SLING  . Tonsillectomy    . Breast lumpectomy with needle localization and axillary sentinel lymph node bx Right 10/04/2012    Procedure: RIGHT BREAST LUMPECTOMY WITH NEEDLE LOCALIZATION AND AXILLARY SENTINEL LYMPH NODE BX;  Surgeon: [name], MD;  Location: [facility];  Service: General;  Laterality: Right;  . Portacath placement Left 10/04/2012    Procedure: INSERTION PORT-A-CATH;  Surgeon: [name], MD;  Location: [facility];  Service: General;  Laterality: Left;  . Orif wrist fracture Left 11/06/2012    Procedure: LEFT OPEN REDUCTION INTERNAL FIXATION (ORIF) DISTAL RADIUS WRIST FRACTURE;  Surgeon: Tennis Must, MD;  Location: [facility];  Service: Orthopedics;  Laterality: Left;    has Chronic diastolic heart failure; Osteoarthritis of hip; Postop Hyponatremia; Cancer of central portion of female breast; Essential hypertension; and Leg pain, posterior on her problem list.     is allergic to oxycodone.      Medication List       This list is accurate as of: 09/11/13 11:59 PM.  Always use your most recent med list.               carvedilol 6.25 MG tablet  Commonly known as:  COREG  Take 1.5 tablets (9.375 mg total) by mouth 2 (two) times daily with a meal.     gabapentin 100 MG capsule  Commonly known as:  NEURONTIN  Take 4 capsules (400 mg total) by mouth at bedtime.     glyBURIDE-metformin 2.5-500 MG per tablet  Commonly known as:  GLUCOVANCE  Take 1 tablet by mouth daily with breakfast.     letrozole 2.5 MG tablet  Commonly known as:  FEMARA  TAKE 1 TABLET ONCE DAILY.     lidocaine-prilocaine cream  Commonly known as:  EMLA  Apply topically as needed.     lisinopril 5 MG tablet  Commonly known as:  PRINIVIL,ZESTRIL  TAKE 1 TABLET EACH DAY.     losartan 25 MG tablet  Commonly known as:  COZAAR  Take 1 tablet (25 mg total) by mouth daily.     lovastatin 10 MG tablet  Commonly known as:  MEVACOR  Take 10 mg by mouth at bedtime.     meloxicam 7.5 MG tablet  Commonly known as:  MOBIC  Take 7.5 mg by mouth 2 (two) times daily. Take 1 tablet  By mouth twice daily     multivitamin capsule  Take 1 capsule by mouth daily.     tretinoin 0.025 % cream  Commonly known as:  RETIN-A  Apply topically at bedtime.         PHYSICAL EXAMINATION  Vitals: 111/59, HR 65, temp 98.0 o2 sat 98%  Physical Exam  Nursing note and vitals reviewed. Constitutional: She is oriented to person, place, and time and well-developed, well-nourished, and in no distress.  HENT:  Head: Normocephalic.  Eyes: Conjunctivae are normal.  Neck: Normal range of motion. Neck supple.  Cardiovascular: Normal rate and regular rhythm.  Exam reveals no friction rub.   No murmur heard. Pulmonary/Chest: Effort normal and breath sounds normal.  Abdominal: Soft. Bowel sounds are normal. She exhibits no mass. There is no tenderness. There is no rebound.  Musculoskeletal: Normal range of motion. She  exhibits no edema and no tenderness.  Neurological: She is alert and oriented to person, place, and time. Gait normal.  Skin: Skin is warm and dry. No rash noted. No erythema.  Psychiatric: Affect normal.   Left posterior knee area with no evidence of erythema, edema, warmth, red streaks. Has full ROM with all extremities.  All pulses palpable and all extremities warm. Does  have left posterior knee engorged varicose veins at specific location of pain.   LABORATORY DATA:. CBC  Lab Results  Component Value Date  WBC 7.3 09/11/2013   RBC 3.99 09/11/2013   HGB 12.3 09/11/2013   HCT 36.9 09/11/2013   PLT 219 09/11/2013   MCV 92.5 09/11/2013   MCH 30.9 09/11/2013   MCHC 33.4 09/11/2013   RDW 14.5 09/11/2013   LYMPHSABS 1.7 09/11/2013   MONOABS 0.6 09/11/2013   EOSABS 0.3 09/11/2013   BASOSABS 0.0 09/11/2013     CMET  Lab Results  Component Value Date   NA 145 09/11/2013   K 4.3 09/11/2013   CL 96 11/01/2012   CO2 26 09/11/2013   GLUCOSE 121 09/11/2013   BUN 15.5 09/11/2013   CREATININE 0.8 09/11/2013   CALCIUM 9.6 09/11/2013   PROT 6.1* 09/11/2013   ALBUMIN 3.9 09/11/2013   AST 14 09/11/2013   ALT 10 09/11/2013   ALKPHOS 72 09/11/2013   BILITOT 0.66 09/11/2013   GFRNONAA 87* 11/01/2012   GFRAA >90 11/01/2012   RADIOGRAPHIC STUDIES: Left lower extremity doppler US:  Date: 09/11/2013 Gender:     F Age:        62 Height: Weight: BSA: Pt. Status: Room:   REFERRING    Haywood Pao  SONOGRAPHER  Landry Mellow, RVT, RDMS  ATTENDING    Drue Second [id number]  [id number]     RKYHC, [id number] [id number]  [place], [place]  Reports also to:  ------------------------------------------------------------------- History and indications:  Indications  729.5 Pain in limb.  History  Diagnostic evaluation. Left leg pain, mostly behind the knee.   ------------------------------------------------------------------- Study information:  Study status:  Routine.  Procedure:  A  vascular evaluation was performed with the patient in the supine position. The right common femoral, left common femoral, left femoral, left greater saphenous, left lesser saphenous, left profunda femoral, left popliteal, left peroneal, and left posterior tibial veins were studied. Image quality was adequate.    Left lower extremity venous duplex evaluation.     Doppler flow study including B-mode compression maneuvers of all visualized segments, color flow Doppler and selected views of pulsed wave Doppler.  Birthdate:  Patient birthdate: [birth year].  Age:  Patient is 73 yr old.  Sex:  Gender: female.  Study date:  Study date: 09/11/2013. Study time: 11:46 AM.  Location:  Vascular laboratory.  Patient status:  Outpatient.  Venous flow:  +--------------------------+-------+------------------------------+ Location                  OverallFlow properties                +--------------------------+-------+------------------------------+ Left common femoral       Patent Phasic; spontaneous;                                            compressible                   +--------------------------+-------+------------------------------+ Left femoral              Patent Compressible                   +--------------------------+-------+------------------------------+ Left profunda femoral     Patent Compressible                   +--------------------------+-------+------------------------------+ Left popliteal            Patent Phasic; spontaneous;  compressible                   +--------------------------+-------+------------------------------+ Left posterior tibial     Patent Compressible                   +--------------------------+-------+------------------------------+ Left peroneal             Patent Compressible                   +--------------------------+-------+------------------------------+ Left  saphenofemoral       Patent Compressible                   junction                                                        +--------------------------+-------+------------------------------+ Left greater saphenous    Patent Compressible                   +--------------------------+-------+------------------------------+  ------------------------------------------------------------------- Summary:  - No evidence of deep vein thrombosis involving the left lower   extremity. - No evidence of Baker&'s cyst on the left.  Other specific details can be found in the table(s) above. Prepared and Electronically Authenticated by  Adele Barthel, MD 2015-07-30T08:54:25            Specimen Collected: 09/11/13 11:46 AM Last Resulted: 09/12/13  8:54 AM                     ASSESSMENT/PLAN:    Cancer of central portion of female breast  Assessment & Plan Patient continues taking letrozole on a daily basis; and Herceptin on an every three-week basis.  She appears to be tolerating the treatments fairly well.  She has plans to return for her next Herceptin infusion in approximately 3 weeks.   Leg pain, posterior  Assessment & Plan Left lower extremity pain specifically at to the area posterior to the knee with no evidence of cellulitis/infection, injury, or trauma.  Patient does have some fairly significant varicose veins at this specific area of discomfort.  All pulses are palpable in all extremities are warm.  Patient was observed with full range of motion to all extremities as well.  Doppler ultrasound obtained today revealed no evidence of DVT or Baker cyst to the left lower extremity.  Patient was encouraged to try ibuprofen on an as-needed basis.  I also advised her to call if her symptoms continue or worsen.    Patient stated understanding of all instructions; and was in agreement with this plan of care. The patient knows to call the clinic with any problems, questions or  concerns.   Will review with Charlestine Massed, NP regarding all aspects of patient's visit today.   Total time spent with patient was 25 minutes;  with greater than 75 percent of that time spent in face to face counseling regarding her symptoms, Doppler US results,  and coordination of care and follow up.  Disclaimer: This note was dictated with voice recognition software. Similar sounding words can inadvertently be transcribed and may not be corrected upon review.   Drue Second, NP 09/12/2013

## 2013-09-12 NOTE — Assessment & Plan Note (Signed)
Patient continues taking letrozole on a daily basis; and Herceptin on an every three-week basis.  She appears to be tolerating the treatments fairly well.  She has plans to return for her next Herceptin infusion in approximately 3 weeks.

## 2013-09-24 ENCOUNTER — Ambulatory Visit: Payer: Medicare Other | Attending: General Surgery | Admitting: Physical Therapy

## 2013-09-24 DIAGNOSIS — C50919 Malignant neoplasm of unspecified site of unspecified female breast: Secondary | ICD-10-CM | POA: Insufficient documentation

## 2013-09-24 DIAGNOSIS — M25569 Pain in unspecified knee: Secondary | ICD-10-CM | POA: Insufficient documentation

## 2013-09-24 DIAGNOSIS — H3552 Pigmentary retinal dystrophy: Secondary | ICD-10-CM | POA: Insufficient documentation

## 2013-09-24 DIAGNOSIS — Z9221 Personal history of antineoplastic chemotherapy: Secondary | ICD-10-CM | POA: Diagnosis not present

## 2013-09-24 DIAGNOSIS — M25519 Pain in unspecified shoulder: Secondary | ICD-10-CM | POA: Insufficient documentation

## 2013-09-24 DIAGNOSIS — Z923 Personal history of irradiation: Secondary | ICD-10-CM | POA: Diagnosis not present

## 2013-09-24 DIAGNOSIS — IMO0001 Reserved for inherently not codable concepts without codable children: Secondary | ICD-10-CM | POA: Insufficient documentation

## 2013-09-27 ENCOUNTER — Ambulatory Visit: Payer: Medicare Other | Admitting: Physical Therapy

## 2013-09-27 DIAGNOSIS — IMO0001 Reserved for inherently not codable concepts without codable children: Secondary | ICD-10-CM | POA: Diagnosis not present

## 2013-10-01 ENCOUNTER — Ambulatory Visit: Payer: Medicare Other | Admitting: Physical Therapy

## 2013-10-02 ENCOUNTER — Other Ambulatory Visit (HOSPITAL_BASED_OUTPATIENT_CLINIC_OR_DEPARTMENT_OTHER): Payer: Medicare Other

## 2013-10-02 ENCOUNTER — Other Ambulatory Visit: Payer: Self-pay | Admitting: *Deleted

## 2013-10-02 ENCOUNTER — Ambulatory Visit (HOSPITAL_BASED_OUTPATIENT_CLINIC_OR_DEPARTMENT_OTHER): Payer: Medicare Other

## 2013-10-02 ENCOUNTER — Other Ambulatory Visit: Payer: Self-pay | Admitting: Oncology

## 2013-10-02 VITALS — BP 99/63 | HR 70 | Temp 98.3°F | Resp 16

## 2013-10-02 DIAGNOSIS — C50119 Malignant neoplasm of central portion of unspecified female breast: Secondary | ICD-10-CM

## 2013-10-02 DIAGNOSIS — Z5112 Encounter for antineoplastic immunotherapy: Secondary | ICD-10-CM

## 2013-10-02 DIAGNOSIS — C50112 Malignant neoplasm of central portion of left female breast: Secondary | ICD-10-CM

## 2013-10-02 DIAGNOSIS — Z452 Encounter for adjustment and management of vascular access device: Secondary | ICD-10-CM

## 2013-10-02 DIAGNOSIS — C50111 Malignant neoplasm of central portion of right female breast: Secondary | ICD-10-CM

## 2013-10-02 LAB — COMPREHENSIVE METABOLIC PANEL (CC13)
ALBUMIN: 4.1 g/dL (ref 3.5–5.0)
ALK PHOS: 77 U/L (ref 40–150)
ALT: 12 U/L (ref 0–55)
ANION GAP: 9 meq/L (ref 3–11)
AST: 13 U/L (ref 5–34)
BUN: 20.2 mg/dL (ref 7.0–26.0)
CO2: 27 meq/L (ref 22–29)
Calcium: 9.5 mg/dL (ref 8.4–10.4)
Chloride: 106 mEq/L (ref 98–109)
Creatinine: 0.9 mg/dL (ref 0.6–1.1)
Glucose: 109 mg/dl (ref 70–140)
POTASSIUM: 4.5 meq/L (ref 3.5–5.1)
SODIUM: 142 meq/L (ref 136–145)
TOTAL PROTEIN: 6.3 g/dL — AB (ref 6.4–8.3)
Total Bilirubin: 0.5 mg/dL (ref 0.20–1.20)

## 2013-10-02 LAB — CBC WITH DIFFERENTIAL/PLATELET
BASO%: 0.4 % (ref 0.0–2.0)
Basophils Absolute: 0 10*3/uL (ref 0.0–0.1)
EOS ABS: 0.2 10*3/uL (ref 0.0–0.5)
EOS%: 2.5 % (ref 0.0–7.0)
HCT: 37.3 % (ref 34.8–46.6)
HGB: 12.4 g/dL (ref 11.6–15.9)
LYMPH%: 19.1 % (ref 14.0–49.7)
MCH: 30.9 pg (ref 25.1–34.0)
MCHC: 33.2 g/dL (ref 31.5–36.0)
MCV: 93 fL (ref 79.5–101.0)
MONO#: 0.6 10*3/uL (ref 0.1–0.9)
MONO%: 6.5 % (ref 0.0–14.0)
NEUT%: 71.5 % (ref 38.4–76.8)
NEUTROS ABS: 6.4 10*3/uL (ref 1.5–6.5)
Platelets: 285 10*3/uL (ref 145–400)
RBC: 4.01 10*6/uL (ref 3.70–5.45)
RDW: 13.8 % (ref 11.2–14.5)
WBC: 9 10*3/uL (ref 3.9–10.3)
lymph#: 1.7 10*3/uL (ref 0.9–3.3)

## 2013-10-02 MED ORDER — DIPHENHYDRAMINE HCL 25 MG PO CAPS
ORAL_CAPSULE | ORAL | Status: AC
  Filled 2013-10-02: qty 2

## 2013-10-02 MED ORDER — TRASTUZUMAB CHEMO INJECTION 440 MG
6.0000 mg/kg | Freq: Once | INTRAVENOUS | Status: AC
  Administered 2013-10-02: 483 mg via INTRAVENOUS
  Filled 2013-10-02: qty 23

## 2013-10-02 MED ORDER — ACETAMINOPHEN 325 MG PO TABS
ORAL_TABLET | ORAL | Status: AC
  Filled 2013-10-02: qty 2

## 2013-10-02 MED ORDER — SODIUM CHLORIDE 0.9 % IV SOLN
Freq: Once | INTRAVENOUS | Status: AC
  Administered 2013-10-02: 12:00:00 via INTRAVENOUS

## 2013-10-02 MED ORDER — ACETAMINOPHEN 325 MG PO TABS
650.0000 mg | ORAL_TABLET | Freq: Once | ORAL | Status: AC
  Administered 2013-10-02: 650 mg via ORAL

## 2013-10-02 MED ORDER — SODIUM CHLORIDE 0.9 % IJ SOLN
10.0000 mL | INTRAMUSCULAR | Status: DC | PRN
  Administered 2013-10-02: 10 mL
  Filled 2013-10-02: qty 10

## 2013-10-02 MED ORDER — DIPHENHYDRAMINE HCL 25 MG PO CAPS: 50.0000 mg | ORAL_CAPSULE | Freq: Once | ORAL | Status: DC

## 2013-10-02 MED ORDER — HEPARIN SOD (PORK) LOCK FLUSH 100 UNIT/ML IV SOLN
500.0000 [IU] | Freq: Once | INTRAVENOUS | Status: AC | PRN
  Administered 2013-10-02: 500 [IU]
  Filled 2013-10-02: qty 5

## 2013-10-02 MED ORDER — ALTEPLASE 2 MG IJ SOLR
2.0000 mg | Freq: Once | INTRAMUSCULAR | Status: AC | PRN
  Administered 2013-10-02: 2 mg
  Filled 2013-10-02: qty 2

## 2013-10-02 NOTE — Progress Notes (Signed)
Patient accessed without difficulty.  PAC flushes easily but no blood return, despite numerous efforts with patient in multiple positions, coughing, using 8ml syringe. Procedure explained to patient and she agrees to try the alteplase for possible fibrin sheath. TPA checked Q30 minutes from 10:15am until 12:45pm with no blood return noted.  Unable to retrieve TPA. Spoke with [facility] RN and she will discuss with Dr. Jana Hakim and set up dye study if he orders this.  Pt. Aware. Discussed TPA with Lew Iacovelli/Pharmacist. OK to flush as normal even though unable to retrieve TPA.  Dose is small -2mg  and this has a short half-life.

## 2013-10-02 NOTE — Patient Instructions (Signed)
[facility] Discharge Instructions for Patients Receiving Chemotherapy  Today you received the following chemotherapy agents: Herceptin  To help prevent nausea and vomiting after your treatment, we encourage you to take your nausea medication as prescribed by your physician.    If you develop nausea and vomiting that is not controlled by your nausea medication, call the clinic.   BELOW ARE SYMPTOMS THAT SHOULD BE REPORTED IMMEDIATELY:  *FEVER GREATER THAN 100.5 F  *CHILLS WITH OR WITHOUT FEVER  NAUSEA AND VOMITING THAT IS NOT CONTROLLED WITH YOUR NAUSEA MEDICATION  *UNUSUAL SHORTNESS OF BREATH  *UNUSUAL BRUISING OR BLEEDING  TENDERNESS IN MOUTH AND THROAT WITH OR WITHOUT PRESENCE OF ULCERS  *URINARY PROBLEMS  *BOWEL PROBLEMS  UNUSUAL RASH Items with * indicate a potential emergency and should be followed up as soon as possible.  Feel free to call the clinic you have any questions or concerns. The clinic phone number is [phone].    

## 2013-10-03 ENCOUNTER — Encounter: Payer: Medicare Other | Admitting: Physical Therapy

## 2013-10-04 ENCOUNTER — Encounter: Payer: Medicare Other | Admitting: Physical Therapy

## 2013-10-07 ENCOUNTER — Other Ambulatory Visit: Payer: Self-pay | Admitting: Radiology

## 2013-10-08 ENCOUNTER — Ambulatory Visit (HOSPITAL_COMMUNITY)
Admission: RE | Admit: 2013-10-08 | Discharge: 2013-10-08 | Disposition: A | Discharge status: Home / Self Care | Payer: Medicare Other | Source: Ambulatory Visit | Attending: Oncology | Admitting: Oncology

## 2013-10-08 DIAGNOSIS — C50119 Malignant neoplasm of central portion of unspecified female breast: Secondary | ICD-10-CM

## 2013-10-08 DIAGNOSIS — Y849 Medical procedure, unspecified as the cause of abnormal reaction of the patient, or of later complication, without mention of misadventure at the time of the procedure: Secondary | ICD-10-CM | POA: Diagnosis not present

## 2013-10-08 DIAGNOSIS — T82598A Other mechanical complication of other cardiac and vascular devices and implants, initial encounter: Secondary | ICD-10-CM | POA: Diagnosis present

## 2013-10-08 MED ORDER — HEPARIN SOD (PORK) LOCK FLUSH 100 UNIT/ML IV SOLN
INTRAVENOUS | Status: AC
  Filled 2013-10-08: qty 5

## 2013-10-08 MED ORDER — IOHEXOL 300 MG/ML  SOLN
10.0000 mL | Freq: Once | INTRAMUSCULAR | Status: AC | PRN
  Administered 2013-10-08: 10 mL via INTRAVENOUS

## 2013-10-09 ENCOUNTER — Other Ambulatory Visit (HOSPITAL_COMMUNITY): Payer: Medicare Other

## 2013-10-10 ENCOUNTER — Encounter (INDEPENDENT_AMBULATORY_CARE_PROVIDER_SITE_OTHER): Payer: Medicare Other | Admitting: General Surgery

## 2013-10-10 ENCOUNTER — Ambulatory Visit (INDEPENDENT_AMBULATORY_CARE_PROVIDER_SITE_OTHER): Payer: Medicare Other | Admitting: General Surgery

## 2013-10-10 ENCOUNTER — Encounter (INDEPENDENT_AMBULATORY_CARE_PROVIDER_SITE_OTHER): Payer: Self-pay | Admitting: General Surgery

## 2013-10-10 VITALS — BP 122/70 | HR 73 | Temp 97.6°F | Ht 66.0 in | Wt 186.0 lb

## 2013-10-10 DIAGNOSIS — C50111 Malignant neoplasm of central portion of right female breast: Secondary | ICD-10-CM

## 2013-10-10 DIAGNOSIS — C50119 Malignant neoplasm of central portion of unspecified female breast: Secondary | ICD-10-CM

## 2013-10-10 NOTE — Progress Notes (Signed)
Subjective:     Patient ID: Angela Rosario, female   DOB: 1940/11/20, 73 y.o.   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 She is undergoing herceptin still. This is due to be complete 9 September. She is on letrozole. I saw her last time for concern of some crusting on her nipple (no discharge) and there were several similar scaly areas on that breast. She does not note any concerns today.  She did have some trouble with her port last time but has only one more infusion.  Review of Systems     Objective:   Physical Exam Physical Exam  Vitals reviewed.  Constitutional: She appears well-developed and well-nourished.  Pulmonary/Chest: Right breast exhibits skin change but this is improved, the other areas are essentially gone Right breast exhibits no inverted nipple, no mass, no nipple discharge and no tenderness. Left breast exhibits no inverted nipple, no mass, no nipple discharge, no skin change and no tenderness.    Lymphadenopathy:  She has no cervical adenopathy.  She has no axillary adenopathy.  Right: No supraclavicular adenopathy present.  Left: No supraclavicular adenopathy present.     Assessment:     Stage I breast cancer     Plan:     I still think this is just local reaction to treatment that is getting better.  Will continue to follow. No concerns on her exam.  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  She will get mm in a couple weeks.

## 2013-10-23 ENCOUNTER — Ambulatory Visit (HOSPITAL_BASED_OUTPATIENT_CLINIC_OR_DEPARTMENT_OTHER): Payer: Medicare Other

## 2013-10-23 ENCOUNTER — Telehealth: Payer: Self-pay | Admitting: Adult Health

## 2013-10-23 ENCOUNTER — Ambulatory Visit (HOSPITAL_BASED_OUTPATIENT_CLINIC_OR_DEPARTMENT_OTHER): Payer: Medicare Other | Admitting: Adult Health

## 2013-10-23 ENCOUNTER — Other Ambulatory Visit (HOSPITAL_BASED_OUTPATIENT_CLINIC_OR_DEPARTMENT_OTHER): Payer: Medicare Other

## 2013-10-23 ENCOUNTER — Encounter: Payer: Self-pay | Admitting: Adult Health

## 2013-10-23 VITALS — BP 113/53 | HR 62 | Temp 98.2°F | Resp 20 | Ht 66.0 in | Wt 186.4 lb

## 2013-10-23 DIAGNOSIS — C50119 Malignant neoplasm of central portion of unspecified female breast: Secondary | ICD-10-CM

## 2013-10-23 DIAGNOSIS — C50112 Malignant neoplasm of central portion of left female breast: Secondary | ICD-10-CM

## 2013-10-23 DIAGNOSIS — Z170 Estrogen receptor positive status [ER+]: Secondary | ICD-10-CM

## 2013-10-23 DIAGNOSIS — C50111 Malignant neoplasm of central portion of right female breast: Secondary | ICD-10-CM

## 2013-10-23 DIAGNOSIS — I8390 Asymptomatic varicose veins of unspecified lower extremity: Secondary | ICD-10-CM

## 2013-10-23 DIAGNOSIS — Z23 Encounter for immunization: Secondary | ICD-10-CM

## 2013-10-23 DIAGNOSIS — Z5112 Encounter for antineoplastic immunotherapy: Secondary | ICD-10-CM

## 2013-10-23 LAB — CBC WITH DIFFERENTIAL/PLATELET
BASO%: 0.7 % (ref 0.0–2.0)
BASOS ABS: 0.1 10*3/uL (ref 0.0–0.1)
EOS%: 3.1 % (ref 0.0–7.0)
Eosinophils Absolute: 0.2 10*3/uL (ref 0.0–0.5)
HEMATOCRIT: 38.2 % (ref 34.8–46.6)
HEMOGLOBIN: 12.6 g/dL (ref 11.6–15.9)
LYMPH%: 22 % (ref 14.0–49.7)
MCH: 30.7 pg (ref 25.1–34.0)
MCHC: 32.9 g/dL (ref 31.5–36.0)
MCV: 93.1 fL (ref 79.5–101.0)
MONO#: 0.5 10*3/uL (ref 0.1–0.9)
MONO%: 7 % (ref 0.0–14.0)
NEUT#: 5 10*3/uL (ref 1.5–6.5)
NEUT%: 67.2 % (ref 38.4–76.8)
PLATELETS: 240 10*3/uL (ref 145–400)
RBC: 4.1 10*6/uL (ref 3.70–5.45)
RDW: 14 % (ref 11.2–14.5)
WBC: 7.4 10*3/uL (ref 3.9–10.3)
lymph#: 1.6 10*3/uL (ref 0.9–3.3)

## 2013-10-23 LAB — COMPREHENSIVE METABOLIC PANEL (CC13)
ALT: 13 U/L (ref 0–55)
ANION GAP: 11 meq/L (ref 3–11)
AST: 15 U/L (ref 5–34)
Albumin: 4 g/dL (ref 3.5–5.0)
Alkaline Phosphatase: 79 U/L (ref 40–150)
BILIRUBIN TOTAL: 0.61 mg/dL (ref 0.20–1.20)
BUN: 19.7 mg/dL (ref 7.0–26.0)
CALCIUM: 9.4 mg/dL (ref 8.4–10.4)
CHLORIDE: 108 meq/L (ref 98–109)
CO2: 23 mEq/L (ref 22–29)
CREATININE: 0.8 mg/dL (ref 0.6–1.1)
Glucose: 142 mg/dl — ABNORMAL HIGH (ref 70–140)
Potassium: 4.2 mEq/L (ref 3.5–5.1)
Sodium: 142 mEq/L (ref 136–145)
Total Protein: 6.4 g/dL (ref 6.4–8.3)

## 2013-10-23 MED ORDER — INFLUENZA VAC SPLIT QUAD 0.5 ML IM SUSY
0.5000 mL | PREFILLED_SYRINGE | Freq: Once | INTRAMUSCULAR | Status: AC
  Administered 2013-10-23: 0.5 mL via INTRAMUSCULAR
  Filled 2013-10-23: qty 0.5

## 2013-10-23 MED ORDER — LORAZEPAM 0.5 MG PO TABS: 0.2500 mg | ORAL_TABLET | Freq: Three times a day (TID) | ORAL | Status: DC

## 2013-10-23 MED ORDER — ACETAMINOPHEN 325 MG PO TABS
650.0000 mg | ORAL_TABLET | Freq: Once | ORAL | Status: AC
  Administered 2013-10-23: 650 mg via ORAL

## 2013-10-23 MED ORDER — SODIUM CHLORIDE 0.9 % IV SOLN
Freq: Once | INTRAVENOUS | Status: AC
  Administered 2013-10-23: 11:00:00 via INTRAVENOUS

## 2013-10-23 MED ORDER — TRASTUZUMAB CHEMO INJECTION 440 MG
6.0000 mg/kg | Freq: Once | INTRAVENOUS | Status: AC
  Administered 2013-10-23: 483 mg via INTRAVENOUS
  Filled 2013-10-23: qty 23

## 2013-10-23 MED ORDER — ACETAMINOPHEN 325 MG PO TABS
ORAL_TABLET | ORAL | Status: AC
  Filled 2013-10-23: qty 2

## 2013-10-23 NOTE — Patient Instructions (Signed)
Lorazepam injection What is this medicine? LORAZEPAM (lor A ze pam) is a benzodiazepine. It is used to treat anxiety and certain types of seizures. It is also used to cause sleep before surgery and to block the memory of the procedure. This medicine may be used for other purposes; ask your health care provider or pharmacist if you have questions. COMMON BRAND NAME(S): Ativan What should I tell my health care provider before I take this medicine? They need to know if you have any of these conditions: -alcohol or drug abuse problem -bipolar disorder, depression, psychosis or other mental health condition -glaucoma -kidney or liver disease -lung disease or breathing difficulties -myasthenia gravis -Parkinson's disease -seizures or a history of seizures -suicidal thoughts -an unusual or allergic reaction to lorazepam, other benzodiazepines, foods, dyes, or preservatives -pregnant or trying to get pregnant -breast-feeding How should I use this medicine? This medicine is for injection into a muscle or into a vein. It is given by a health care professional in a hospital or clinic setting. Talk to your pediatrician regarding the use of this medicine in children. Special care may be needed. Overdosage: If you think you have taken too much of this medicine contact a poison control center or emergency room at once. NOTE: This medicine is only for you. Do not share this medicine with others. What if I miss a dose? This does not apply. What may interact with this medicine? -barbiturate medicines for inducing sleep or treating seizures, like phenobarbital -clozapine -medicines for depression, mental problems or psychiatric disturbances -medicines for sleep -phenytoin -probenecid -theophylline -valproic acid This list may not describe all possible interactions. Give your health care provider a list of all the medicines, herbs, non-prescription drugs, or dietary supplements you use. Also tell them  if you smoke, drink alcohol, or use illegal drugs. Some items may interact with your medicine. What should I watch for while using this medicine? You may feel dizzy or drowsy for about 6 to 8 hours after an injection of this medicine. Elderly patients may feel these effects more strongly and for a longer time. Do not drive, use machinery, or do anything that needs mental alertness until you know how this medicine affects you. To reduce the risk of dizzy and fainting spells, do not stand or sit up quickly, especially if you are an older patient. Alcohol may increase dizziness and drowsiness. Avoid alcoholic drinks. This medicine can cause loss of recall of recent events. This loss of memory is only temporary. Do not treat yourself for coughs, colds, pain or allergies without asking your doctor or health care professional for advice. Some ingredients can increase possible side effects. What side effects may I notice from receiving this medicine? Side effects that you should report to your doctor or health care professional as soon as possible: -changes in vision -confusion -depression -mood changes, excitability or aggressive behavior -movement difficulty, staggering or jerky movements -muscle cramps -restlessness -weakness or tiredness Side effects that usually do not require medical attention (report to your doctor or health care professional if they continue or are bothersome): -constipation or diarrhea -difficulty sleeping, nightmares -dizziness, drowsiness -headache -nausea, vomiting This list may not describe all possible side effects. Call your doctor for medical advice about side effects. You may report side effects to FDA at 1-800-FDA-1088. Where should I keep my medicine? This medication will be given to you in a hospital or health clinic setting. You will not be given this medicine to take home. NOTE: This sheet  is a summary. It may not cover all possible information. If you have  questions about this medicine, talk to your doctor, pharmacist, or health care provider.  2015, [employer]. (2007-08-06 17:13:24)

## 2013-10-23 NOTE — Patient Instructions (Signed)

## 2013-10-23 NOTE — Telephone Encounter (Signed)
cld& spoke to pt in re to appt times & date-pt has MY CHART and will review as well-pt understood

## 2013-10-23 NOTE — Progress Notes (Signed)
ID: [name] OB: 1940-06-24  MR#: [id number]  ZES#:[id number]  PCP: Haywood Pao, MD GYN:   SU: Dr Donne Hazel OTHER MD:  Dr. Isidore Moos: radiation oncology  CHIEF COMPLAINT: Patient with right breast triple positive invasive mammary here for evaluation prior to herceptin.    BREAST CANCER HISTORY:  The patient palpated a mass approximately 6 months after her annual mammogram. Diagnostic mammogram on 09/11/2012 showed a 7 mm mass at the 12 o'clock position. Ultrasound showed the mass to be 7 mm at the 12 o'clock position. Right breast needle core biopsy of the 12 o'clock position on 09/12/2012 showed invasive mammary carcinoma with lobular features, grade 2, estrogen receptor 100% positive, progesterone receptor 100% positive, Ki-67 45%, HER-2/neu by CISH showed amplification with ratio 2.96. Bilateral breast MRI on 09/18/2012 showed a 1.5 x 1.7 x 1.5 cm irregular mass in the right breast with no mass or abnormal enhancements in the left breast or lymph nodes (clinical stage I, T1 N0).  CURRENT THERAPY: Letrozole daily, Trastuzumab  INTERVAL HISTORY: Aunica is doing well today.  She is here with her friend.  She is here for evaluation prior to receiving Trastuzumab therapy.  She is taking Letrozole daily and continues to tolerating it relatively well.  She does have some dry eyes and vaginal dryness.  She wants to know what to take for those.  She wants to know if she can stop the Meloxicam cold [place] since it was prescribed for pain that ended up being a vericose vein.  She wants to know if she needs to f/u with Dr. Vaughan Browner, and if she can use Emla cream on her port before it is removed by Dr. Donne Hazel on 12/05/13.  She has stopped the gabapentin because her neuropathy has resolved.  She does feel dizzy sometimes and noted that her blood pressure is on the low side.  Otherwise, she denies fevers, chills, nausea, vomiting, new pain, chest pain, DOE, orthopnea, or any further concerns.    REVIEW OF SYSTEMS: A 10 point review of systems was conducted and is otherwise negative except for what is noted above.     PAST MEDICAL HISTORY: Past Medical History  Diagnosis Date  . Syncope   . Diastolic dysfunction   . Hyperlipidemia   . Episode of dizziness     Mild episodes  . Orthostatic hypotension     Some episodes  . Arthritis     oa  . Stress incontinence, female     wears pads  . AC (acromioclavicular) joint bone spurs     bone spurs in neck  . 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 Diabetes mellitus     niddm; last A1C 6.2  . Hypertension   . Wears glasses   . Breast cancer   . Skin cancer     multiple sites (neck, nose, etc.)  . Carcinoma of breast treated with adjuvant chemotherapy      Three weeks of Herceptin therapy that started on 11/19/2012 and adjuvant chemotherapy consisting of [place] started on 12/10/2012. She completed therapy [place] therapy on 02/18/13 and began adjuvant every 3 week Herceptin on 02/25/13.   . S/P radiation therapy  04/01/2013-05/14/2013    1) Right breast / 50 Gy in 25 fractions, 2) Right breast boost / 10 Gy in 5 fractions    PAST SURGICAL HISTORY: Past Surgical History  Procedure Laterality Date  . Other surgical history      Hysterectomy  . Cholecystectomy  1992  . Abdominal hysterectomy  1985    1 ovary removed  . Total hip arthroplasty  02/23/2011    Procedure: TOTAL HIP ARTHROPLASTY;  Surgeon: Dione Plover Aluisio;  Location: [facility];  Service: Orthopedics;  Laterality: Left;  . Pubovaginal sling  02/03/2012    Procedure: Gaynelle Arabian;  Surgeon: Franchot Gallo, MD;  [facility];  Service: Urology;  Laterality: N/A;  1 HR  LYNX SLING  . Tonsillectomy    . Breast lumpectomy with needle localization and axillary sentinel lymph node bx Right 10/04/2012    Procedure: RIGHT BREAST LUMPECTOMY WITH NEEDLE LOCALIZATION AND AXILLARY SENTINEL LYMPH NODE BX;  Surgeon: [name], MD;   Location: [place];  Service: General;  Laterality: Right;  . Portacath placement Left 10/04/2012    Procedure: INSERTION PORT-A-CATH;  Surgeon: [name], MD;  Location: [place];  Service: General;  Laterality: Left;  . Orif wrist fracture Left 11/06/2012    Procedure: LEFT OPEN REDUCTION INTERNAL FIXATION (ORIF) DISTAL RADIUS WRIST FRACTURE;  Surgeon: Tennis Must, MD;  Location: [place];  Service: Orthopedics;  Laterality: Left;    FAMILY HISTORY Family History  Problem Relation Age of Onset  . Lung cancer Maternal Uncle   . Breast cancer Paternal Aunt     dx <50  . Breast cancer Paternal Uncle 80  . Prostate cancer Cousin     paternal cousin    GYNECOLOGIC HISTORY: s/p TAH, g2p2, was on oral contraceptives previously without difficulty, was on HRT for 10 years without problems, s/p TAH, in [place]: Lives alone in a house in [place], [place], is retired.  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    ADVANCED DIRECTIVES: In place.  Health care power of attorney is her son Cloa Bushong.     HEALTH MAINTENANCE: History  Substance Use Topics  . Smoking status: Former Smoker -- 0.25 packs/day for 1 years    Quit date: 02/14/1957  . Smokeless tobacco: Never Used  . Alcohol Use: No      Mammogram: Due 10/28/2013 Colonoscopy: 2011 with 13 year f/u Bone Density Scan: 05/2013 normal Pap Smear: s/p TAH Eye Exam: 06/2013 Lipid Panel: 05/2013   Allergies  Allergen Reactions  . Oxycodone Other (See Comments)    hallucinations    Current Outpatient Prescriptions  Medication Sig Dispense Refill  . carvedilol (COREG) 6.25 MG tablet Take 1.5 tablets (9.375 mg total) by mouth 2 (two) times daily with a meal.  270 tablet  1  . gabapentin (NEURONTIN) 100 MG capsule Take 4 capsules (400 mg total) by mouth at bedtime.  120 capsule  6  . glyBURIDE-metformin (GLUCOVANCE) 2.5-500 MG per tablet Take 1 tablet by  mouth daily with breakfast.       . letrozole (FEMARA) 2.5 MG tablet TAKE 1 TABLET ONCE DAILY.  30 tablet  12  . lidocaine-prilocaine (EMLA) cream Apply topically as needed.  30 g  1  . lisinopril (PRINIVIL,ZESTRIL) 5 MG tablet       . losartan (COZAAR) 25 MG tablet Take 1 tablet (25 mg total) by mouth daily.  30 tablet  6  . lovastatin (MEVACOR) 10 MG tablet Take 10 mg by mouth at bedtime.       . meloxicam (MOBIC) 7.5 MG tablet Take 7.5 mg by mouth 2 (two) times daily. Take 1 tablet  By mouth twice daily      . Multiple Vitamin (MULTIVITAMIN)  capsule Take 1 capsule by mouth daily.      Marland Kitchen tretinoin (RETIN-A) 0.025 % cream Apply topically at bedtime.       No current facility-administered medications for this visit.   Facility-Administered Medications Ordered in Other Visits  Medication Dose Route Frequency Provider Last Rate Last Dose  . gentamicin (GARAMYCIN) 160 mg in dextrose 5 % 50 mL IVPB  160 mg Intravenous 30 min Pre-Op Jorja Loa, MD        OBJECTIVE: Filed Vitals:   10/23/13 0933  BP: 113/53  Pulse: 62  Temp: 98.2 F (36.8 C)  Resp: 20     Body mass index is 30.1 kg/(m^2).     GENERAL: Patient is a well appearing female in no acute distress HEENT:  Sclerae anicteric.  Oropharynx clear and moist. No ulcerations or evidence of oropharyngeal candidiasis. Neck is supple.  NODES:  No cervical, supraclavicular, or axillary lymphadenopathy palpated.  BREAST EXAM:  Right breast lumpectomy site without nodularity, no masses.  Left breast without masses, nodules or lesions.  Benign bilateral breast exam. LUNGS:  Clear to auscultation bilaterally.  No wheezes or rhonchi. HEART:  Regular rate and rhythm. No murmur appreciated. ABDOMEN:  Soft, nontender.  Positive, normoactive bowel sounds. No organomegaly palpated. MSK:  No focal spinal tenderness to palpation. Full range of motion bilaterally in the upper extremities. EXTREMITIES:  No peripheral edema.   SKIN:  Clear with  no obvious rashes or skin changes. No nail dyscrasia. NEURO:  Nonfocal. Well oriented.  Appropriate affect. ECOG FS:0 - Asymptomatic  LAB RESULTS:  CMP     Component Value Date/Time   NA 142 10/23/2013 0914   NA 135 11/01/2012 1350   K 4.2 10/23/2013 0914   K 4.2 11/01/2012 1350   CL 96 11/01/2012 1350   CO2 23 10/23/2013 0914   CO2 26 11/01/2012 1350   GLUCOSE 142* 10/23/2013 0914   GLUCOSE 120* 11/01/2012 1350   BUN 19.7 10/23/2013 0914   BUN 10 11/01/2012 1350   CREATININE 0.8 10/23/2013 0914   CREATININE 0.64 11/01/2012 1350   CALCIUM 9.4 10/23/2013 0914   CALCIUM 9.7 11/01/2012 1350   PROT 6.4 10/23/2013 0914   PROT 7.3 02/17/2011 1045   ALBUMIN 4.0 10/23/2013 0914   ALBUMIN 4.3 02/17/2011 1045   AST 15 10/23/2013 0914   AST 26 02/17/2011 1045   ALT 13 10/23/2013 0914   ALT 36* 02/17/2011 1045   ALKPHOS 79 10/23/2013 0914   ALKPHOS 77 02/17/2011 1045   BILITOT 0.61 10/23/2013 0914   BILITOT 0.5 02/17/2011 1045   GFRNONAA 87* 11/01/2012 1350   GFRAA >90 11/01/2012 1350    I No results found for this basename: SPEP,  UPEP,   kappa and lambda light chains    Lab Results  Component Value Date   WBC 7.4 10/23/2013   NEUTROABS 5.0 10/23/2013   HGB 12.6 10/23/2013   HCT 38.2 10/23/2013   MCV 93.1 10/23/2013   PLT 240 10/23/2013      Chemistry      Component Value Date/Time   NA 142 10/23/2013 0914   NA 135 11/01/2012 1350   K 4.2 10/23/2013 0914   K 4.2 11/01/2012 1350   CL 96 11/01/2012 1350   CO2 23 10/23/2013 0914   CO2 26 11/01/2012 1350   BUN 19.7 10/23/2013 0914   BUN 10 11/01/2012 1350   CREATININE 0.8 10/23/2013 0914   CREATININE 0.64 11/01/2012 1350      Component Value Date/Time  CALCIUM 9.4 10/23/2013 0914   CALCIUM 9.7 11/01/2012 1350   ALKPHOS 79 10/23/2013 0914   ALKPHOS 77 02/17/2011 1045   AST 15 10/23/2013 0914   AST 26 02/17/2011 1045   ALT 13 10/23/2013 0914   ALT 36* 02/17/2011 1045   BILITOT 0.61 10/23/2013 0914   BILITOT 0.5 02/17/2011 1045       No results found for this basename: LABCA2    No  components found with this basename: LABCA125    No results found for this basename: INR,  in the last 168 hours  Urinalysis    Component Value Date/Time   COLORURINE YELLOW 02/17/2011 [place] 02/17/2011 1049   LABSPEC 1.030 01/28/2013 1537   LABSPEC 1.025 02/17/2011 1049   PHURINE 6.5 02/17/2011 1049   GLUCOSEU Negative 01/28/2013 1537   GLUCOSEU NEGATIVE 02/17/2011 [place] 02/17/2011 1049   BILIRUBINUR NEGATIVE 02/17/2011 1049   KETONESUR NEGATIVE 02/17/2011 1049   PROTEINUR NEGATIVE 02/17/2011 1049   UROBILINOGEN 0.2 01/28/2013 1537   UROBILINOGEN 1.0 02/17/2011 1049   NITRITE NEGATIVE 02/17/2011 1049   LEUKOCYTESUR SMALL* 02/17/2011 1049    STUDIES: No results found.  ASSESSMENT: 73 y.o. [place], [place] woman with T1 N0, stage IA right breast invasive ductal carcinoma, grade II, ER 100%, PR 100%, Ki-67 45%, HER-2/neu positive.    1. Status post right breast lumpectomy with right axillary sentinel lymph node biopsy on 10/04/2012 for a stage I, pT1c, pN0, MX, 1.6 cm invasive ductal carcinoma, grade 2 with associated intermediate grade ductal carcinoma in situ, tumor involved the dermis and lymphovascular invasion was identified, close to anterior margin, other margins were negative, estrogen receptor 100% positive, progesterone receptor 100% positive, Ki-67 45%, HER-2/neu amplified, with 0/5 metastatic right axillary lymph nodes.   2. Adjuvant chemotherapy consisting of [place] (Taxotere/Carboplatin/Herceptin) was originally scheduled to start on 11/19/2012 however the patient suffered a fall on 11/06/2012 and subsequently underwent arm fracture repair surgery. She received three weeks of Herceptin therapy that started on 11/19/2012 and adjuvant chemotherapy consisting of [place] started on 12/10/2012. She completed therapy [place] therapy on 02/18/13 and began adjuvant every 3 week Herceptin on 02/25/13.   3. Adjuvant radiation therapy with Dr. Isidore Moos from 04/01/13 through 05/14/13.    PLAN:   Eloyse is doing well today.  Her lab work is normal and I reviewed this with her in detail.  She will proceed with Trastuzumab today.  She is tolerating Letrozole daily and will continue this.  She has no sign of recurrence.    I informed her that she can stop her meloxicam and that it was okay to take it once in a while if needed for joint aches.  Due to the varicose vein, she is going to see a vein specialist.    I told her that it was okay to apply a small amount of emla cream to her port to help alleviate the sting of being injected with lidocaine.  I informed them that they may still feel a slight sting from the lidocaine in the skin.  I am copying him on this note.    Her neuroapthy has resolved.    Her health maintenance was reviewed above, and she will proceed with 3d mammo on 10/28/13.  I did give her lorazepam to take if needed for anxiety related to her first mammo since diagnosis.   I recommended she continue to monitor her BP at home and keep a log.  She will bring this  to her Dr. Ardyth Man. Mclean appt in October, 2015.  I requested them schedule f/u today.    Sherie will return in 3 months for labs and f/u with Dr. Lindi Adie.   She knows to call us in the interim for any questions or concerns.  We can certainly see her sooner if needed.  I spent 40 minutes counseling the patient face to face.  The total time spent in the appointment was 50 minutes.  Minette Headland, [place] [phone] 10/23/2013 10:09 AM

## 2013-10-25 ENCOUNTER — Telehealth: Payer: Self-pay | Admitting: *Deleted

## 2013-10-25 NOTE — Telephone Encounter (Signed)
Called pt back concerning her call about "nerve pills". Communicated with pt that the Rx I gave her at her appt was for the Lorazepam. Pt was not aware the paper I gave her was a Rx. She said she will take it by her pharmacy this am. Pt verbalized understanding. Had no further concerns. Message to be forwarded to Charlestine Massed, NP.

## 2013-11-01 ENCOUNTER — Other Ambulatory Visit: Payer: Self-pay | Admitting: Adult Health

## 2013-11-12 ENCOUNTER — Other Ambulatory Visit: Payer: Self-pay | Admitting: *Deleted

## 2013-11-12 DIAGNOSIS — M79605 Pain in left leg: Secondary | ICD-10-CM

## 2013-11-25 ENCOUNTER — Ambulatory Visit (HOSPITAL_BASED_OUTPATIENT_CLINIC_OR_DEPARTMENT_OTHER)
Admission: RE | Admit: 2013-11-25 | Discharge: 2013-11-25 | Disposition: A | Discharge status: Home / Self Care | Payer: Medicare Other | Source: Ambulatory Visit | Attending: Cardiology | Admitting: Cardiology

## 2013-11-25 ENCOUNTER — Ambulatory Visit (HOSPITAL_COMMUNITY)
Admission: RE | Admit: 2013-11-25 | Discharge: 2013-11-25 | Disposition: A | Discharge status: Home / Self Care | Payer: Medicare Other | Source: Ambulatory Visit | Attending: Internal Medicine | Admitting: Internal Medicine

## 2013-11-25 VITALS — BP 98/56 | HR 67 | Wt 187.2 lb

## 2013-11-25 DIAGNOSIS — Z87891 Personal history of nicotine dependence: Secondary | ICD-10-CM | POA: Diagnosis not present

## 2013-11-25 DIAGNOSIS — E785 Hyperlipidemia, unspecified: Secondary | ICD-10-CM | POA: Insufficient documentation

## 2013-11-25 DIAGNOSIS — I10 Essential (primary) hypertension: Secondary | ICD-10-CM

## 2013-11-25 DIAGNOSIS — C50119 Malignant neoplasm of central portion of unspecified female breast: Secondary | ICD-10-CM

## 2013-11-25 DIAGNOSIS — E119 Type 2 diabetes mellitus without complications: Secondary | ICD-10-CM | POA: Insufficient documentation

## 2013-11-25 DIAGNOSIS — C50111 Malignant neoplasm of central portion of right female breast: Secondary | ICD-10-CM | POA: Insufficient documentation

## 2013-11-25 MED ORDER — CARVEDILOL 6.25 MG PO TABS: 6.2500 mg | ORAL_TABLET | Freq: Two times a day (BID) | ORAL | Status: DC

## 2013-11-25 NOTE — Progress Notes (Signed)
Patient ID: Angela Rosario, female   DOB: 07-05-1940, 73 y.o.   MRN: [id number] Oncologist: Dr Humphrey Rolls PCP: Dr Osborne Casco  HPI: Angela Rosario is a 73 y.o.with a history of stage I R breast cancer, invasive ductal carcinoma of the right breast HER-2/neu positive with Ki-67 of 45%.   She also has a history S/P R lumpectomy, sinus tach on carvedilol, DM, retinitis pigmentosa, and HTN with no known coronary disease referred to cardio-oncology clinic by Dr Humphrey Rolls.  She was started on adjuvant chemotherapy consisting of [place] (Taxotere/Carboplatin/Herceptin). Followed by radiation. Followed by herceptin for 1 year.   At this point, she has completed her Herceptin. Denies SOB/Orthopnea.  No lightheadedness with standing (h/o orthostatic hypotension).  Able to walk a couple miles 3-4 times a week.   ECHO 09/2012 EF 60% lateral S 10.4 GLS -23.7% ECHO 02/13/13 EF 60-65% lateral S' 10.6 GLS - 19.8% ECHO 4/15 EF 55% (clearly less vigorous), lateral s' 9.7 cm/sec, GLS -16.8% =>Herceptin held ECHO 5/15 EF 60%, lateral S' 11.7, GLS -18.8 ECHO 08/14/13 EF 60% Lateral S' 11.3 GLS -20 ECHO 10/15 EF 55-60%, mild FBSH, normal RV size and systolic function, lateral S' 9.8, GLS -18.8%.   Labs (4/15): K 4.7, creatinine 0.7 Labs 07/31/13 : K 4.4 Creatinine 0.8 Labs (9/15): K 4.2, creatinine 0.8  FH: GM had CVA Father parkinson  SH: Lives alone. Retired Economist. 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   Review of Systems: All systems reviewed and negative except as per HPI.   Past Medical History  Diagnosis Date  . Syncope   . Diastolic dysfunction   . Hyperlipidemia   . Episode of dizziness     Mild episodes  . Orthostatic hypotension     Some episodes  . Arthritis     oa  . Stress incontinence, female     wears pads  . AC (acromioclavicular) joint bone spurs     bone spurs in neck  . 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 Diabetes mellitus     niddm; last A1C 6.2  . Hypertension    . Wears glasses   . Breast cancer   . Skin cancer     multiple sites (neck, nose, etc.)  . Carcinoma of breast treated with adjuvant chemotherapy      Three weeks of Herceptin therapy that started on 11/19/2012 and adjuvant chemotherapy consisting of [place] started on 12/10/2012. She completed therapy [place] therapy on 02/18/13 and began adjuvant every 3 week Herceptin on 02/25/13.   . S/P radiation therapy  04/01/2013-05/14/2013    1) Right breast / 50 Gy in 25 fractions, 2) Right breast boost / 10 Gy in 5 fractions    Current Outpatient Prescriptions  Medication Sig Dispense Refill  . carvedilol (COREG) 6.25 MG tablet Take 1 tablet (6.25 mg total) by mouth 2 (two) times daily with a meal.  270 tablet  1  . glyBURIDE-metformin (GLUCOVANCE) 2.5-500 MG per tablet Take 1 tablet by mouth daily with breakfast.       . letrozole (FEMARA) 2.5 MG tablet TAKE 1 TABLET ONCE DAILY.  30 tablet  12  . losartan (COZAAR) 25 MG tablet Take 1 tablet (25 mg total) by mouth daily.  30 tablet  6  . lovastatin (MEVACOR) 10 MG tablet Take 10 mg by mouth at bedtime.       . meloxicam (MOBIC) 7.5 MG tablet Take 7.5 mg by mouth 2 (two) times daily. Take 1  tablet  By mouth twice daily      . Multiple Vitamin (MULTIVITAMIN) capsule Take 1 capsule by mouth daily.       No current facility-administered medications for this encounter.   Facility-Administered Medications Ordered in Other Encounters  Medication Dose Route Frequency Provider Last Rate Last Dose  . gentamicin (GARAMYCIN) 160 mg in dextrose 5 % 50 mL IVPB  160 mg Intravenous 30 min Pre-Op Jorja Loa, MD         Allergies  Allergen Reactions  . Oxycodone Other (See Comments)    hallucinations    History   Social History  . Marital Status: Single    Spouse Name: N/A    Number of Children: N/A  . Years of Education: N/A   Occupational History  . Not on file.   Social History Main Topics  . Smoking status: Former Smoker -- 0.25 packs/day for  1 years    Quit date: 02/14/1957  . Smokeless tobacco: Never Used  . Alcohol Use: No  . Drug Use: No  . Sexual Activity: Not Currently    Birth Control/ Protection: Surgical   Other Topics Concern  . Not on file   Social History Narrative   Divorced, lives alone with her poodle   PHYSICAL EXAM: Filed Vitals:   11/25/13 1102  BP: 98/56  Pulse: 67   General:  Well appearing. No respiratory difficulty HEENT: normal Neck: supple. no JVD. Carotids 2+ bilat; no bruits. No lymphadenopathy or thryomegaly appreciated. Cor: PMI nondisplaced. Regular rate & rhythm. No rubs, gallops or murmurs. Lungs: clear Abdomen: soft, nontender, nondistended. No hepatosplenomegaly. No bruits or masses. Good bowel sounds. Extremities: no cyanosis, clubbing, rash. No ankle edema.  Neuro: alert & oriented x 3, cranial nerves grossly intact. moves all 4 extremities w/o difficulty. Affect pleasant.   ASSESSMENT & PLAN: 1. R Breast Cancer: HER-2/neu positive. She has completed Herceptin.  I reviewed today's leterkLateral S' is a little lower but only 1 measurement was taken. - BP is running low today, decrease Coreg to 6.25 mg bid and continue losartan.  - Would repeat echo in 6 months to see if stable, then back back down on losartan and Coreg further.  2. HTN: stable.   Loralie Champagne 11/25/2013

## 2013-11-25 NOTE — Patient Instructions (Addendum)
Decrease Carvedilol to 6.25 mg (1 tab) Twice daily   We will contact you in 6 months to schedule your next appointment and echocardiogram

## 2013-11-25 NOTE — Progress Notes (Signed)
*  PRELIMINARY RESULTS* Echocardiogram 2D Echocardiogram has been performed.  Angela Rosario 11/25/2013, 10:49 AM

## 2013-12-12 ENCOUNTER — Encounter: Payer: Self-pay | Admitting: Vascular Surgery

## 2013-12-13 ENCOUNTER — Encounter: Payer: Self-pay | Admitting: Vascular Surgery

## 2013-12-13 ENCOUNTER — Ambulatory Visit (HOSPITAL_COMMUNITY)
Admission: RE | Admit: 2013-12-13 | Discharge: 2013-12-13 | Disposition: A | Discharge status: Home / Self Care | Payer: Medicare Other | Source: Ambulatory Visit | Attending: Vascular Surgery | Admitting: Vascular Surgery

## 2013-12-13 ENCOUNTER — Ambulatory Visit (INDEPENDENT_AMBULATORY_CARE_PROVIDER_SITE_OTHER): Payer: Medicare Other | Admitting: Vascular Surgery

## 2013-12-13 VITALS — BP 132/66 | HR 77 | Ht 66.0 in | Wt 188.6 lb

## 2013-12-13 DIAGNOSIS — I83899 Varicose veins of unspecified lower extremities with other complications: Secondary | ICD-10-CM | POA: Insufficient documentation

## 2013-12-13 DIAGNOSIS — M79602 Pain in left arm: Secondary | ICD-10-CM

## 2013-12-13 DIAGNOSIS — M79605 Pain in left leg: Secondary | ICD-10-CM | POA: Diagnosis not present

## 2013-12-13 DIAGNOSIS — I83893 Varicose veins of bilateral lower extremities with other complications: Secondary | ICD-10-CM

## 2013-12-13 NOTE — Progress Notes (Signed)
Referred by:  Haywood Pao, MD [id number] [place], [place] [address]  Reason for referral: B varicose veins  History of Present Illness  Angela Rosario is a 73 y.o. ([birth year]) female who presents with chief complaint: left posterior knee pain.  Patient notes, onset of swelling both legs years ago, associated with no obvious trigger.  The patient's symptoms include: Left knee posterior knee pain which is positional in nature.  Laying on her stomach helps with the pain.  There is no history of progressive swelling in both legs.  The patient denies any bursting sensation.  The patient has had no history of DVT, known history of pregnancy, known history of varicose vein, no history of venous stasis ulcers, no history of  Lymphedema and known history of skin changes in lower legs.  There is known family history of venous disorders.  The patient has never used compression stockings in the past.  Past Medical History  Diagnosis Date  . Syncope   . Diastolic dysfunction   . Hyperlipidemia   . Episode of dizziness     Mild episodes  . Orthostatic hypotension     Some episodes  . Arthritis     oa  . Stress incontinence, female     wears pads  . AC (acromioclavicular) joint bone spurs     bone spurs in neck  . 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 Diabetes mellitus     niddm; last A1C 6.2  . Hypertension   . Wears glasses   . Breast cancer   . Skin cancer     multiple sites (neck, nose, etc.)  . Carcinoma of breast treated with adjuvant chemotherapy      Three weeks of Herceptin therapy that started on 11/19/2012 and adjuvant chemotherapy consisting of [place] started on 12/10/2012. She completed therapy [place] therapy on 02/18/13 and began adjuvant every 3 week Herceptin on 02/25/13.   . S/P radiation therapy  04/01/2013-05/14/2013    1) Right breast / 50 Gy in 25 fractions, 2) Right breast boost / 10 Gy in 5 fractions    Past Surgical History  Procedure  Laterality Date  . Other surgical history      Hysterectomy  . Cholecystectomy  1992  . Abdominal hysterectomy  1985    1 ovary removed  . Total hip arthroplasty  02/23/2011    Procedure: TOTAL HIP ARTHROPLASTY;  Surgeon: Dione Plover Aluisio;  Location: [facility];  Service: Orthopedics;  Laterality: Left;  . Pubovaginal sling  02/03/2012    Procedure: Gaynelle Arabian;  Surgeon: Franchot Gallo, MD;  Location: [facility];  Service: Urology;  Laterality: N/A;  1 HR  LYNX SLING  . Tonsillectomy    . Breast lumpectomy with needle localization and axillary sentinel lymph node bx Right 10/04/2012    Procedure: RIGHT BREAST LUMPECTOMY WITH NEEDLE LOCALIZATION AND AXILLARY SENTINEL LYMPH NODE BX;  Surgeon: [name], MD;  Location: [facility];  Service: General;  Laterality: Right;  . Portacath placement Left 10/04/2012    Procedure: INSERTION PORT-A-CATH;  Surgeon: [name], MD;  Location: [facility];  Service: General;  Laterality: Left;  . Orif wrist fracture Left 11/06/2012    Procedure: LEFT OPEN REDUCTION INTERNAL FIXATION (ORIF) DISTAL RADIUS WRIST FRACTURE;  Surgeon: Tennis Must, MD;  Location: [facility];  Service: Orthopedics;  Laterality: Left;    History   Social History  .  Marital Status: Single    Spouse Name: N/A    Number of Children: N/A  . Years of Education: N/A   Occupational History  . Not on file.   Social History Main Topics  . Smoking status: Former Smoker -- 0.25 packs/day for 1 years    Quit date: 02/14/1957  . Smokeless tobacco: Never Used  . Alcohol Use: No  . Drug Use: No  . Sexual Activity: Not Currently    Birth Control/ Protection: Surgical   Other Topics Concern  . Not on file   Social History Narrative   Divorced, lives alone with her poodle    Family History  Problem Relation Age of Onset  . Lung cancer Maternal Uncle   . Breast cancer Paternal Aunt     dx <50  .  Breast cancer Paternal Uncle 45  . Prostate cancer Cousin     paternal cousin    Current Outpatient Prescriptions on File Prior to Visit  Medication Sig Dispense Refill  . carvedilol (COREG) 6.25 MG tablet Take 1 tablet (6.25 mg total) by mouth 2 (two) times daily with a meal.  270 tablet  1  . glyBURIDE-metformin (GLUCOVANCE) 2.5-500 MG per tablet Take 1 tablet by mouth daily with breakfast.       . letrozole (FEMARA) 2.5 MG tablet TAKE 1 TABLET ONCE DAILY.  30 tablet  12  . losartan (COZAAR) 25 MG tablet Take 1 tablet (25 mg total) by mouth daily.  30 tablet  6  . lovastatin (MEVACOR) 10 MG tablet Take 10 mg by mouth at bedtime.       . Multiple Vitamin (MULTIVITAMIN) capsule Take 1 capsule by mouth daily.      . meloxicam (MOBIC) 7.5 MG tablet Take 7.5 mg by mouth 2 (two) times daily. Take 1 tablet  By mouth twice daily       Current Facility-Administered Medications on File Prior to Visit  Medication Dose Route Frequency Provider Last Rate Last Dose  . gentamicin (GARAMYCIN) 160 mg in dextrose 5 % 50 mL IVPB  160 mg Intravenous 30 min Pre-Op Jorja Loa, MD        Allergies  Allergen Reactions  . Oxycodone Other (See Comments)    hallucinations    REVIEW OF SYSTEMS:  (Positives checked otherwise negative)  CARDIOVASCULAR:  []  chest pain, []  chest pressure, []  palpitations, []  shortness of breath when laying flat, []  shortness of breath with exertion,  [x]  pain in feet when walking, []  pain in feet when laying flat, []  history of blood clot in veins (DVT), []  history of phlebitis, []  swelling in legs, [x]  varicose veins  PULMONARY:  []  productive cough, []  asthma, []  wheezing  NEUROLOGIC:  []  weakness in arms or legs, []  numbness in arms or legs, []  difficulty speaking or slurred speech, []  temporary loss of vision in one eye, []  dizziness  HEMATOLOGIC:  []  bleeding problems, []  problems with blood clotting too easily  MUSCULOSKEL:  []  joint pain, []  joint  swelling  GASTROINTEST:  []  vomiting blood, []  blood in stool     GENITOURINARY:  []  burning with urination, []  blood in urine  PSYCHIATRIC:  []  history of major depression  INTEGUMENTARY:  []  rashes, []  ulcers  CONSTITUTIONAL:  []  fever, []  chills   Physical Examination Filed Vitals:   12/13/13 1500  BP: 132/66  Pulse: 77  Height: 5\' 6"  (1.676 m)  Weight: 188 lb 9.6 oz (85.548 kg)  SpO2: 95%   Body  mass index is 30.46 kg/(m^2).  General: A&O x 3, WD, obese  Head: [place]/AT  Ear/Nose/Throat: Hearing grossly intact, nares w/o erythema or drainage, oropharynx w/o Erythema/Exudate  Eyes: PERRLA, EOMI  Neck: Supple, no nuchal rigidity, no palpable LAD  Pulmonary: Sym exp, good air movt, CTAB, no rales, rhonchi, & wheezing  Cardiac: RRR, Nl S1, S2, no Murmurs, rubs or gallops  Vascular: Vessel Right Left  Radial Palpable Palpable  Brachial Palpable Palpable  Carotid Palpable, without bruit Palpable, without bruit  Aorta Not palpable N/A  Femoral Palpable Palpable  Popliteal Not palpable Not palpable  PT Faintly Palpable Faintly Palpable  DP Faintly Palpable Palpable   Gastrointestinal: soft, NTND, -G/R, - HSM, - masses, - CVAT B  Musculoskeletal: M/S 5/5 throughout , Extremities without ischemic changes , B visible varicosities and spider veins, cluster of spider veins posterior to L knee, mild lipedema fat distribution  Neurologic: CN 2-12 intact , Pain and light touch intact in extremities , Motor exam as listed above  Psychiatric: Judgment intact, Mood & affect appropriate for pt's clinical situation  Dermatologic: See M/S exam for extremity exam, no rashes otherwise noted  Lymph : No Cervical, Axillary, or Inguinal lymphadenopathy   Non-Invasive Vascular Imaging  LLE Venous Insufficiency Duplex (Date: 12/13/2013):   No DVT and SVT,   No GSV reflux,   + deep venous reflux: CFV  Medical Decision Making  Angela Rosario is a 73 y.o. female who  presents with: LLE chronic venous insufficiency (C2), L posterior knee pain unlikely related to CVI   Based on the patient's history and examination, I recommend: OTC compression stockings to thigh height.  I suspect this patient would have difficulty getting on fully therapeutic compression stockings and I doubt they will relieve this patient's sx anyway.  Her sx are more consistent with an orthopedic etiology in my opinion.  Thank you for allowing [place] to participate in this patient's care.  Adele Barthel, MD [facility] Office: [phone] Pager: [phone]  12/13/2013, 3:29 PM

## 2014-01-27 ENCOUNTER — Telehealth: Payer: Self-pay | Admitting: Hematology and Oncology

## 2014-01-27 ENCOUNTER — Other Ambulatory Visit (HOSPITAL_BASED_OUTPATIENT_CLINIC_OR_DEPARTMENT_OTHER): Payer: Medicare Other

## 2014-01-27 ENCOUNTER — Ambulatory Visit (HOSPITAL_BASED_OUTPATIENT_CLINIC_OR_DEPARTMENT_OTHER): Payer: Medicare Other | Admitting: Hematology and Oncology

## 2014-01-27 VITALS — BP 134/72 | HR 72 | Temp 98.2°F | Resp 18 | Ht 66.0 in | Wt 190.1 lb

## 2014-01-27 DIAGNOSIS — C50811 Malignant neoplasm of overlapping sites of right female breast: Secondary | ICD-10-CM

## 2014-01-27 DIAGNOSIS — C50111 Malignant neoplasm of central portion of right female breast: Secondary | ICD-10-CM

## 2014-01-27 DIAGNOSIS — R739 Hyperglycemia, unspecified: Secondary | ICD-10-CM

## 2014-01-27 LAB — COMPREHENSIVE METABOLIC PANEL (CC13)
ALT: 16 U/L (ref 0–55)
ANION GAP: 9 meq/L (ref 3–11)
AST: 12 U/L (ref 5–34)
Albumin: 4 g/dL (ref 3.5–5.0)
Alkaline Phosphatase: 91 U/L (ref 40–150)
BUN: 13.7 mg/dL (ref 7.0–26.0)
CALCIUM: 9.5 mg/dL (ref 8.4–10.4)
CHLORIDE: 105 meq/L (ref 98–109)
CO2: 25 meq/L (ref 22–29)
CREATININE: 0.9 mg/dL (ref 0.6–1.1)
EGFR: 67 mL/min/{1.73_m2} — AB (ref >90)
GLUCOSE: 270 mg/dL — AB (ref 70–140)
POTASSIUM: 4.1 meq/L (ref 3.5–5.1)
Sodium: 139 mEq/L (ref 136–145)
Total Bilirubin: 0.43 mg/dL (ref 0.20–1.20)
Total Protein: 6 g/dL — ABNORMAL LOW (ref 6.4–8.3)

## 2014-01-27 LAB — CBC WITH DIFFERENTIAL/PLATELET
BASO%: 0.3 % (ref 0.0–2.0)
BASOS ABS: 0 10*3/uL (ref 0.0–0.1)
EOS ABS: 0.2 10*3/uL (ref 0.0–0.5)
EOS%: 2.8 % (ref 0.0–7.0)
HEMATOCRIT: 40.2 % (ref 34.8–46.6)
HEMOGLOBIN: 13.5 g/dL (ref 11.6–15.9)
LYMPH#: 2 10*3/uL (ref 0.9–3.3)
LYMPH%: 28 % (ref 14.0–49.7)
MCH: 30.3 pg (ref 25.1–34.0)
MCHC: 33.6 g/dL (ref 31.5–36.0)
MCV: 90.1 fL (ref 79.5–101.0)
MONO#: 0.5 10*3/uL (ref 0.1–0.9)
MONO%: 7.5 % (ref 0.0–14.0)
NEUT%: 61.4 % (ref 38.4–76.8)
NEUTROS ABS: 4.3 10*3/uL (ref 1.5–6.5)
Platelets: 242 10*3/uL (ref 145–400)
RBC: 4.46 10*6/uL (ref 3.70–5.45)
RDW: 14 % (ref 11.2–14.5)
WBC: 7 10*3/uL (ref 3.9–10.3)

## 2014-01-27 NOTE — Telephone Encounter (Signed)
per pof to sch pt appt-gave pt copy of sch °

## 2014-01-27 NOTE — Assessment & Plan Note (Addendum)
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 Status post lumpectomy 10/04/2012 1.6 cm 0/5 lymph nodes T1 cN0 M0 stage IA status post adjuvant chemotherapy with [place] followed by Herceptin maintenance completed 10/23/2013, adjuvant radiation therapy was also complete and is now on adjuvant antiestrogen therapy with Femara started April 2015.  Aromatase inhibitor toxicities: vaginal dryness.  Surveillance: 1. Breast exam 01/27/2014 normal 2. Mammogram 10/18/2013 normal 3. Bone density April 2015: normal  T score +3.6 in the spine No clinical or radiological evidence of disease relapse.  Survivorship: I discussed with her the importance of physical exercise in prevention of cancer as well as for general health. Discussed importance of increasing fruits and vegetables in the diet. Also discussed importance of vitamin D as well as surveillance for other cancers as recommended by her primary care physician.   return to clinic in 6 months for follow-up

## 2014-01-27 NOTE — Progress Notes (Signed)
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   Staging form: Breast, AJCC 7th Edition     Clinical: Stage IA (T1c, N0, cM0) - Unsigned       Staging comments: Staged at breast conference 09/26/12      Pathologic: No stage assigned - Unsigned   SUMMARY OF ONCOLOGIC HISTORY:   Cancer of central portion of right female breast   09/11/2012 Mammogram Right breast mass 7 mm at 12:00 position   09/12/2012 Initial Biopsy Invasive mammary cancer with lobular features grade 2 ER 100%, PR percent, TSH some 45%, HER-2 positive ratio 2.96   09/18/2012 Breast MRI Right breast mass 1.5 x 1.7 x 1.5 cm, no abnormal lymph nodes clinical stage TI cN0 M0 stage IA   10/04/2012 Surgery Right breast lumpectomy 1.6 cm IDC grade 2 with associated intermediate grade DCIS tumor involving the dermis and lymphovascular invasion ER 100%, PR 100%, just some 45%, HER-2 positive, 0/5 lymph nodes   11/19/2012 - 10/23/2013 Chemotherapy Adjuvant chemotherapy: [place] complicated by a fall [id number]/5573 fracture required surgery) resume chemotherapy 12/10/2012 completed [facility] 02/18/2013, Herceptin maintenance completed 10/23/2013   04/01/2013 - 05/14/2013 Radiation Therapy Adjuvant radiation therapy by Dr. Isidore Moos   05/28/2013 -  Anti-estrogen oral therapy Letrozole 2.5 mg daily    CHIEF COMPLIANT:  Follow-up of breast cancer  INTERVAL HISTORY: Angela Rosario is a  73 year old Caucasian with above-mentioned history of breast cancer treated with lumpectomy follow-up adjuvant chemotherapy 4 cycles of [place] complicated by health issues confusion etc.. She then underwent Herceptin maintenance along with radiation therapy and then started on letrozole April 2015. She has been tolerating letrozole extremely well without any major problems or concerns. Is here for a routine follow-up.  REVIEW OF SYSTEMS:   Constitutional: Denies fevers, chills or abnormal  weight loss Eyes: Denies blurriness of vision Ears, nose, mouth, throat, and face: Denies mucositis or sore throat Respiratory: Denies cough, dyspnea or wheezes Cardiovascular: Denies palpitation, chest discomfort or lower extremity swelling Gastrointestinal:  Denies nausea, heartburn or change in bowel habits Skin: Denies abnormal skin rashes Lymphatics: Denies new lymphadenopathy or easy bruising Neurological:Denies numbness, tingling or new weaknesses Behavioral/Psych: Mood is stable, no new changes  Breast:  denies any pain or lumps or nodules in either breasts All other systems were reviewed with the patient and are negative.  I have reviewed the past medical history, past surgical history, social history and family history with the patient and they are unchanged from previous note.  ALLERGIES:  is allergic to oxycodone.  MEDICATIONS:  Current Outpatient Prescriptions  Medication Sig Dispense Refill  . carvedilol (COREG) 6.25 MG tablet Take 1 tablet (6.25 mg total) by mouth 2 (two) times daily with a meal. 270 tablet 1  . glyBURIDE-metformin (GLUCOVANCE) 2.5-500 MG per tablet Take 1 tablet by mouth daily with breakfast.     . letrozole (FEMARA) 2.5 MG tablet TAKE 1 TABLET ONCE DAILY. 30 tablet 12  . losartan (COZAAR) 25 MG tablet Take 1 tablet (25 mg total) by mouth daily. 30 tablet 6  . lovastatin (MEVACOR) 10 MG tablet Take 10 mg by mouth at bedtime.     . Multiple Vitamin (MULTIVITAMIN) capsule Take 1 capsule by mouth daily.     No current facility-administered medications for this visit.   Facility-Administered Medications Ordered in Other Visits  Medication Dose Route Frequency Provider Last Rate Last Dose  . gentamicin (GARAMYCIN) 160  mg in dextrose 5 % 50 mL IVPB  160 mg Intravenous 30 min Pre-Op Jorja Loa, MD        PHYSICAL EXAMINATION: ECOG PERFORMANCE STATUS: 0 - Asymptomatic  Filed Vitals:   01/27/14 1007  BP: 134/72  Pulse: 72  Temp: 98.2 F (36.8  C)  Resp: 18   Filed Weights   01/27/14 1007  Weight: 190 lb 1.6 oz (86.229 kg)    GENERAL:alert, no distress and comfortable SKIN: skin color, texture, turgor are normal, no rashes or significant lesions EYES: normal, Conjunctiva are pink and non-injected, sclera clear OROPHARYNX:no exudate, no erythema and lips, buccal mucosa, and tongue normal  NECK: supple, thyroid normal size, non-tender, without nodularity LYMPH:  no palpable lymphadenopathy in the cervical, axillary or inguinal LUNGS: clear to auscultation and percussion with normal breathing effort HEART: regular rate & rhythm and no murmurs and no lower extremity edema ABDOMEN:abdomen soft, non-tender and normal bowel sounds Musculoskeletal:no cyanosis of digits and no clubbing  NEURO: alert & oriented x 3 with fluent speech, no focal motor/sensory deficits BREAST: No palpable masses or nodules in either right or left breasts. No palpable axillary supraclavicular or infraclavicular adenopathy no breast tenderness or nipple discharge.   LABORATORY DATA:  I have reviewed the data as listed   Chemistry      Component Value Date/Time   NA 139 01/27/2014 0950   NA 135 11/01/2012 1350   K 4.1 01/27/2014 0950   K 4.2 11/01/2012 1350   CL 96 11/01/2012 1350   CO2 25 01/27/2014 0950   CO2 26 11/01/2012 1350   BUN 13.7 01/27/2014 0950   BUN 10 11/01/2012 1350   CREATININE 0.9 01/27/2014 0950   CREATININE 0.64 11/01/2012 1350      Component Value Date/Time   CALCIUM 9.5 01/27/2014 0950   CALCIUM 9.7 11/01/2012 1350   ALKPHOS 91 01/27/2014 0950   ALKPHOS 77 02/17/2011 1045   AST 12 01/27/2014 0950   AST 26 02/17/2011 1045   ALT 16 01/27/2014 0950   ALT 36* 02/17/2011 1045   BILITOT 0.43 01/27/2014 0950   BILITOT 0.5 02/17/2011 1045       Lab Results  Component Value Date   WBC 7.0 01/27/2014   HGB 13.5 01/27/2014   HCT 40.2 01/27/2014   MCV 90.1 01/27/2014   PLT 242 01/27/2014   NEUTROABS 4.3 01/27/2014      RADIOGRAPHIC STUDIES: I have personally reviewed the radiology reports and agreed with their findings.  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 Status post lumpectomy 10/04/2012 1.6 cm 0/5 lymph nodes T1 cN0 M0 stage IA status post adjuvant chemotherapy with [place] followed by Herceptin maintenance completed 10/23/2013, adjuvant radiation therapy was also complete and is now on adjuvant antiestrogen therapy with Femara started April 2015.  Aromatase inhibitor toxicities: vaginal dryness.  Surveillance: 1. Breast exam 01/27/2014 normal 2. Mammogram 10/18/2013 normal 3. Bone density April 2015: normal  T score +3.6 in the spine No clinical or radiological evidence of disease relapse.  Survivorship: I discussed with her the importance of physical exercise in prevention of cancer as well as for general health. Discussed importance of increasing fruits and vegetables in the diet. Also discussed importance of vitamin D as well as surveillance for other cancers as recommended by  her primary care physician.   return to clinic in 6 months for follow-up   elevated blood sugar: blood sugar is 270. I instructed the patient to discuss with her primary care physician. She had a hemoglobin A1c of 6 in November 2015.  No orders of the defined types were placed in this encounter.   The patient has a good understanding of the overall plan. she agrees with it. She will call with any problems that may develop before her next visit here.   Rulon Eisenmenger, MD 01/27/2014 10:48 AM

## 2014-02-27 ENCOUNTER — Other Ambulatory Visit (HOSPITAL_COMMUNITY): Payer: Self-pay | Admitting: Adult Health

## 2014-04-27 ENCOUNTER — Other Ambulatory Visit: Payer: Self-pay | Admitting: Cardiology

## 2014-06-30 ENCOUNTER — Other Ambulatory Visit (HOSPITAL_COMMUNITY): Payer: Self-pay | Admitting: Internal Medicine

## 2014-08-05 ENCOUNTER — Other Ambulatory Visit (HOSPITAL_BASED_OUTPATIENT_CLINIC_OR_DEPARTMENT_OTHER): Payer: Medicare Other

## 2014-08-05 ENCOUNTER — Encounter: Payer: Self-pay | Admitting: Hematology and Oncology

## 2014-08-05 ENCOUNTER — Telehealth: Payer: Self-pay | Admitting: Hematology and Oncology

## 2014-08-05 ENCOUNTER — Ambulatory Visit (HOSPITAL_BASED_OUTPATIENT_CLINIC_OR_DEPARTMENT_OTHER): Payer: Medicare Other | Admitting: Hematology and Oncology

## 2014-08-05 VITALS — BP 121/63 | HR 71 | Temp 97.7°F | Resp 18 | Ht 66.0 in | Wt 185.6 lb

## 2014-08-05 DIAGNOSIS — N898 Other specified noninflammatory disorders of vagina: Secondary | ICD-10-CM | POA: Diagnosis not present

## 2014-08-05 DIAGNOSIS — C50111 Malignant neoplasm of central portion of right female breast: Secondary | ICD-10-CM | POA: Diagnosis not present

## 2014-08-05 DIAGNOSIS — Z9221 Personal history of antineoplastic chemotherapy: Secondary | ICD-10-CM

## 2014-08-05 DIAGNOSIS — Z79811 Long term (current) use of aromatase inhibitors: Secondary | ICD-10-CM

## 2014-08-05 DIAGNOSIS — Z923 Personal history of irradiation: Secondary | ICD-10-CM | POA: Diagnosis not present

## 2014-08-05 DIAGNOSIS — Z170 Estrogen receptor positive status [ER+]: Secondary | ICD-10-CM

## 2014-08-05 LAB — COMPREHENSIVE METABOLIC PANEL (CC13)
ALT: 17 U/L (ref 0–55)
ANION GAP: 8 meq/L (ref 3–11)
AST: 17 U/L (ref 5–34)
Albumin: 4.4 g/dL (ref 3.5–5.0)
Alkaline Phosphatase: 83 U/L (ref 40–150)
BUN: 17.2 mg/dL (ref 7.0–26.0)
CALCIUM: 9.8 mg/dL (ref 8.4–10.4)
CHLORIDE: 106 meq/L (ref 98–109)
CO2: 29 meq/L (ref 22–29)
CREATININE: 0.8 mg/dL (ref 0.6–1.1)
EGFR: 71 mL/min/{1.73_m2} — AB (ref >90)
Glucose: 138 mg/dl (ref 70–140)
Potassium: 4.2 mEq/L (ref 3.5–5.1)
Sodium: 143 mEq/L (ref 136–145)
Total Bilirubin: 0.94 mg/dL (ref 0.20–1.20)
Total Protein: 6.4 g/dL (ref 6.4–8.3)

## 2014-08-05 LAB — CBC WITH DIFFERENTIAL/PLATELET
BASO%: 0.3 % (ref 0.0–2.0)
BASOS ABS: 0 10*3/uL (ref 0.0–0.1)
EOS ABS: 0.1 10*3/uL (ref 0.0–0.5)
EOS%: 1.7 % (ref 0.0–7.0)
HEMATOCRIT: 41.1 % (ref 34.8–46.6)
HEMOGLOBIN: 14.1 g/dL (ref 11.6–15.9)
LYMPH%: 23.8 % (ref 14.0–49.7)
MCH: 31.2 pg (ref 25.1–34.0)
MCHC: 34.3 g/dL (ref 31.5–36.0)
MCV: 90.9 fL (ref 79.5–101.0)
MONO#: 0.6 10*3/uL (ref 0.1–0.9)
MONO%: 8.3 % (ref 0.0–14.0)
NEUT%: 65.9 % (ref 38.4–76.8)
NEUTROS ABS: 4.7 10*3/uL (ref 1.5–6.5)
Platelets: 252 10*3/uL (ref 145–400)
RBC: 4.52 10*6/uL (ref 3.70–5.45)
RDW: 14.3 % (ref 11.2–14.5)
WBC: 7.2 10*3/uL (ref 3.9–10.3)
lymph#: 1.7 10*3/uL (ref 0.9–3.3)

## 2014-08-05 MED ORDER — LETROZOLE 2.5 MG PO TABS: 2.5000 mg | ORAL_TABLET | Freq: Every day | ORAL | Status: DC

## 2014-08-05 NOTE — Assessment & Plan Note (Signed)
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 Status post lumpectomy 10/04/2012 1.6 cm 0/5 lymph nodes T1 cN0 M0 stage IA status post adjuvant chemotherapy with [place] followed by Herceptin maintenance completed 10/23/2013, adjuvant radiation therapy was also complete and is now on adjuvant antiestrogen therapy with Femara started April 2015.  Aromatase inhibitor toxicities: vaginal dryness.  Surveillance: 1. Breast exam 08/05/2014 normal 2. Mammogram 10/18/2013 normal. I have requested her annual mammogram to be scheduled at [facility] in September 2016. 3. Bone density April 2015: normal T score +3.6 in the spine No clinical or radiological evidence of disease relapse.  Survivorship: I discussed with her the importance of physical exercise in prevention of cancer as well as for general health. Discussed importance of increasing fruits and vegetables in the diet. Also discussed importance of vitamin D as well as surveillance for other cancers as recommended by her primary care physician.  return to clinic in 6 months for follow-up

## 2014-08-05 NOTE — Progress Notes (Signed)
Patient Care Team: Drenda Freeze, MD as PCP - General (Internal Medicine)  DIAGNOSIS: Cancer of central portion of right female breast   Staging form: Breast, AJCC 7th Edition     Clinical: Stage IA (T1c, N0, cM0) - Unsigned       Staging comments: Staged at breast conference 09/26/12      Pathologic: No stage assigned - Unsigned   SUMMARY OF ONCOLOGIC HISTORY:   Cancer of central portion of right female breast   09/11/2012 Mammogram Right breast mass 7 mm at 12:00 position   09/12/2012 Initial Biopsy Invasive mammary cancer with lobular features grade 2 ER 100%, PR percent, TSH some 45%, HER-2 positive ratio 2.96   09/18/2012 Breast MRI Right breast mass 1.5 x 1.7 x 1.5 cm, no abnormal lymph nodes clinical stage TI cN0 M0 stage IA   10/04/2012 Surgery Right breast lumpectomy 1.6 cm IDC grade 2 with associated intermediate grade DCIS tumor involving the dermis and lymphovascular invasion ER 100%, PR 100%, just some 45%, HER-2 positive, 0/5 lymph nodes   11/19/2012 - 10/23/2013 Chemotherapy Adjuvant chemotherapy: [place] complicated by a fall 25/85/2778 fracture required surgery) resume chemotherapy 12/10/2012 completed [facility] 02/18/2013, Herceptin maintenance completed 10/23/2013   04/01/2013 - 05/14/2013 Radiation Therapy Adjuvant radiation therapy by Dr. Isidore Moos   05/28/2013 -  Anti-estrogen oral therapy Letrozole 2.5 mg daily    CHIEF COMPLIANT:  Follow-up of breast cancer  INTERVAL HISTORY: Angela Rosario is a  74 year old Caucasian with above-mentioned history of breast cancer treated with lumpectomy follow-up adjuvant chemotherapy 4 cycles of [place] complicated by health issues confusion etc.. She then underwent Herceptin maintenance along with radiation therapy and then started on letrozole April 2015. She has been tolerating letrozole extremely well without any major problems or concerns. Is here for a routine follow-up.  REVIEW OF SYSTEMS:   Constitutional: Denies fevers, chills or abnormal weight  loss Eyes: Denies blurriness of vision Ears, nose, mouth, throat, and face: Denies mucositis or sore throat Respiratory: Denies cough, dyspnea or wheezes Cardiovascular: Denies palpitation, chest discomfort or lower extremity swelling Gastrointestinal:  Denies nausea, heartburn or change in bowel habits Skin: Denies abnormal skin rashes Lymphatics: Denies new lymphadenopathy or easy bruising Neurological:Denies numbness, tingling or new weaknesses Behavioral/Psych: Mood is stable, no new changes  Breast:  denies lumps or nodules in either breasts. Reports intermittent sharp shooting pain in the right breast. All other systems were reviewed with the patient and are negative.  I have reviewed the past medical history, past surgical history, social history and family history with the patient and they are unchanged from previous note.  ALLERGIES:  is allergic to oxycodone.  MEDICATIONS:  Current Outpatient Prescriptions  Medication Sig Dispense Refill  . carvedilol (COREG) 6.25 MG tablet Take 1 tablet (6.25 mg total) by mouth 2 (two) times daily with a meal. 180 tablet 3  . cyanocobalamin 1000 MCG tablet Take 100 mcg by mouth daily.    Marland Kitchen glyBURIDE (DIABETA) 2.5 MG tablet Take 2.5 mg by mouth daily with breakfast.    . letrozole (FEMARA) 2.5 MG tablet Take 1 tablet (2.5 mg total) by mouth daily. 90 tablet 3  . losartan (COZAAR) 25 MG tablet TAKE 1 TABLET ONCE DAILY. 30 tablet 3  . lovastatin (MEVACOR) 10 MG tablet Take 10 mg by mouth at bedtime.     . metFORMIN (GLUCOPHAGE) 500 MG tablet 500 mg daily with breakfast.   5  . Multiple Vitamin (MULTIVITAMIN) capsule Take 1 capsule by mouth daily.  No current facility-administered medications for this visit.   Facility-Administered Medications Ordered in Other Visits  Medication Dose Route Frequency Provider Last Rate Last Dose  . gentamicin (GARAMYCIN) 160 mg in dextrose 5 % 50 mL IVPB  160 mg Intravenous 30 min Pre-Op Franchot Gallo,  MD        PHYSICAL EXAMINATION: ECOG PERFORMANCE STATUS: 0 - Asymptomatic  Filed Vitals:   08/05/14 0923  BP: 121/63  Pulse: 71  Temp: 97.7 F (36.5 C)  Resp: 18   Filed Weights   08/05/14 0923  Weight: 185 lb 9.6 oz (84.188 kg)    GENERAL:alert, no distress and comfortable SKIN: skin color, texture, turgor are normal, no rashes or significant lesions EYES: normal, Conjunctiva are pink and non-injected, sclera clear OROPHARYNX:no exudate, no erythema and lips, buccal mucosa, and tongue normal  NECK: supple, thyroid normal size, non-tender, without nodularity LYMPH:  no palpable lymphadenopathy in the cervical, axillary or inguinal LUNGS: clear to auscultation and percussion with normal breathing effort HEART: regular rate & rhythm and no murmurs and no lower extremity edema ABDOMEN:abdomen soft, non-tender and normal bowel sounds Musculoskeletal:no cyanosis of digits and no clubbing  NEURO: alert & oriented x 3 with fluent speech, no focal motor/sensory deficits BREAST: No palpable masses or nodules in either right or left breasts. No palpable axillary supraclavicular or infraclavicular adenopathy no breast tenderness or nipple discharge.   LABORATORY DATA:  I have reviewed the data as listed   Chemistry      Component Value Date/Time   NA 143 08/05/2014 0907   NA 135 11/01/2012 1350   K 4.2 08/05/2014 0907   K 4.2 11/01/2012 1350   CL 96 11/01/2012 1350   CO2 29 08/05/2014 0907   CO2 26 11/01/2012 1350   BUN 17.2 08/05/2014 0907   BUN 10 11/01/2012 1350   CREATININE 0.8 08/05/2014 0907   CREATININE 0.64 11/01/2012 1350      Component Value Date/Time   CALCIUM 9.8 08/05/2014 0907   CALCIUM 9.7 11/01/2012 1350   ALKPHOS 83 08/05/2014 0907   ALKPHOS 77 02/17/2011 1045   AST 17 08/05/2014 0907   AST 26 02/17/2011 1045   ALT 17 08/05/2014 0907   ALT 36* 02/17/2011 1045   BILITOT 0.94 08/05/2014 0907   BILITOT 0.5 02/17/2011 1045       Lab Results   Component Value Date   WBC 7.2 08/05/2014   HGB 14.1 08/05/2014   HCT 41.1 08/05/2014   MCV 90.9 08/05/2014   PLT 252 08/05/2014   NEUTROABS 4.7 08/05/2014     RADIOGRAPHIC STUDIES: I have personally reviewed the radiology reports and agreed with their findings.  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 Status post lumpectomy 10/04/2012 1.6 cm 0/5 lymph nodes T1 cN0 M0 stage IA status post adjuvant chemotherapy with [place] followed by Herceptin maintenance completed 10/23/2013, adjuvant radiation therapy was also complete and is now on adjuvant antiestrogen therapy with Femara started April 2015.  Aromatase inhibitor toxicities: vaginal dryness.  Surveillance: 1. Breast exam 08/05/2014 normal 2. Mammogram 10/18/2013 normal. I have requested her annual mammogram to be scheduled at [facility] in September 2016. 3. Bone density April 2015: normal T score +3.6 in the spine No clinical or radiological evidence of disease relapse.  Survivorship: I discussed with  her the importance of physical exercise in prevention of cancer as well as for general health. Discussed importance of increasing fruits and vegetables in the diet. Also discussed importance of vitamin D as well as surveillance for other cancers as recommended by her primary care physician.  return to clinic in 6 months for follow-up   Orders Placed This Encounter  Procedures  . MM Digital Diagnostic Bilat    Standing Status: Future     Number of Occurrences:      Standing Expiration Date: 08/05/2015    Order Specific Question:  Reason for exam:    Answer:  Hx of right breast cancer. S/P lumpectomy. Annual mammogram.    Order Specific Question:  Preferred imaging location?    Answer:  External     Comments:   [facility]  . CBC with Differential/Platelet    Standing Status: Future     Number of Occurrences:      Standing Expiration Date: 08/05/2015  . Comprehensive metabolic panel    Standing Status: Future     Number of Occurrences:      Standing Expiration Date: 08/05/2015   The patient has a good understanding of the overall plan. she agrees with it. She will call with any problems that may develop before her next visit here.  The patient was seen and discussed with Dr. Lindi Adie who outlined the plan of care.   Mikey Bussing, DNP, AGPCNP-BC, Lance Bosch, NP 08/05/2014 10:04 AM  Attending Note  I personally saw and examined Angela Rosario. The plan of care was discussed with her. I agree with the assessment and plan as documented above. In summary Mrs. Lilia Pro has stage IA right breast cancer that was ER/PR positive who received adjuvant chemotherapy and is now on adjuvant Femara. She is tolerating it fairly well without any major problems other than vaginal dryness. Breast cancer surveillance revealed a normal breast exam and mammograms in September 2015 were also normal. Last bone density test was done in April 2015. We will see her back in 6 months for another routine follow-up and breast examination.  Signed Rulon Eisenmenger, MD

## 2014-08-05 NOTE — Telephone Encounter (Signed)
Gave avs & calendar for January. Patient request January appointment. Faxed Mammo order to [facility], Patient will call to schedule Mammo due to transportation.

## 2014-08-11 ENCOUNTER — Other Ambulatory Visit: Payer: Self-pay

## 2014-09-10 ENCOUNTER — Telehealth: Payer: Self-pay

## 2014-09-10 NOTE — Telephone Encounter (Signed)
Order faxed to [facility] for mammgoram.  Sent to scan.

## 2014-10-31 ENCOUNTER — Other Ambulatory Visit: Payer: Self-pay | Admitting: Internal Medicine

## 2014-12-11 ENCOUNTER — Other Ambulatory Visit: Payer: Self-pay | Admitting: Internal Medicine

## 2015-01-10 ENCOUNTER — Other Ambulatory Visit: Payer: Self-pay | Admitting: Internal Medicine

## 2015-01-30 ENCOUNTER — Encounter: Payer: Self-pay | Admitting: Hematology and Oncology

## 2015-02-14 ENCOUNTER — Other Ambulatory Visit: Payer: Self-pay | Admitting: Internal Medicine

## 2015-02-26 NOTE — Assessment & Plan Note (Signed)
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 Status post lumpectomy 10/04/2012 1.6 cm 0/5 lymph nodes T1 cN0 M0 stage IA status post adjuvant chemotherapy with [place] followed by Herceptin maintenance completed 10/23/2013, adjuvant radiation therapy was also complete and is now on adjuvant antiestrogen therapy with Femara started April 2015.  Aromatase inhibitor toxicities: vaginal dryness.  Surveillance: 1. Breast exam 02/27/15 normal 2. Mammogram 10/18/2013 normal. I have requested her annual mammogram to be scheduled at [facility] in September 2016. 3. Bone density April 2015: normal T score +3.6 in the spine No clinical or radiological evidence of disease relapse.  return to clinic in 1 year for follow-up

## 2015-02-27 ENCOUNTER — Telehealth: Payer: Self-pay | Admitting: Hematology and Oncology

## 2015-02-27 ENCOUNTER — Other Ambulatory Visit (HOSPITAL_BASED_OUTPATIENT_CLINIC_OR_DEPARTMENT_OTHER): Payer: Medicare Other

## 2015-02-27 ENCOUNTER — Ambulatory Visit (HOSPITAL_BASED_OUTPATIENT_CLINIC_OR_DEPARTMENT_OTHER): Payer: Medicare Other | Admitting: Hematology and Oncology

## 2015-02-27 ENCOUNTER — Encounter: Payer: Self-pay | Admitting: Hematology and Oncology

## 2015-02-27 VITALS — BP 124/70 | HR 73 | Temp 98.1°F | Resp 18 | Ht 66.0 in | Wt 190.0 lb

## 2015-02-27 DIAGNOSIS — C50111 Malignant neoplasm of central portion of right female breast: Secondary | ICD-10-CM | POA: Diagnosis not present

## 2015-02-27 DIAGNOSIS — R079 Chest pain, unspecified: Secondary | ICD-10-CM | POA: Diagnosis not present

## 2015-02-27 DIAGNOSIS — N898 Other specified noninflammatory disorders of vagina: Secondary | ICD-10-CM

## 2015-02-27 LAB — COMPREHENSIVE METABOLIC PANEL
ALBUMIN: 4.2 g/dL (ref 3.5–5.0)
ALK PHOS: 89 U/L (ref 40–150)
ALT: 16 U/L (ref 0–55)
AST: 14 U/L (ref 5–34)
Anion Gap: 10 mEq/L (ref 3–11)
BUN: 15.4 mg/dL (ref 7.0–26.0)
CO2: 26 mEq/L (ref 22–29)
Calcium: 9.4 mg/dL (ref 8.4–10.4)
Chloride: 105 mEq/L (ref 98–109)
Creatinine: 0.9 mg/dL (ref 0.6–1.1)
EGFR: 60 mL/min/{1.73_m2} — ABNORMAL LOW (ref >90)
Glucose: 203 mg/dl — ABNORMAL HIGH (ref 70–140)
POTASSIUM: 4.4 meq/L (ref 3.5–5.1)
SODIUM: 141 meq/L (ref 136–145)
TOTAL PROTEIN: 6.5 g/dL (ref 6.4–8.3)
Total Bilirubin: 0.91 mg/dL (ref 0.20–1.20)

## 2015-02-27 NOTE — Addendum Note (Signed)
Addended by: Prentiss Bells on: 02/27/2015 10:08 AM   Modules accepted: Medications

## 2015-02-27 NOTE — Progress Notes (Signed)
Face to face assessment performed with patient.  Pain under right breast, intermittent, 9/10 short sharp "Takes breath away".  Rt breast - 6 wk ago noticed lump in breast.

## 2015-02-27 NOTE — Progress Notes (Signed)
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 breast ([place])   09/11/2012 Mammogram Right breast mass 7 mm at 12:00 position   09/12/2012 Initial Biopsy Invasive mammary cancer with lobular features grade 2 ER 100%, PR percent, TSH some 45%, HER-2 positive ratio 2.96   09/18/2012 Breast MRI Right breast mass 1.5 x 1.7 x 1.5 cm, no abnormal lymph nodes clinical stage TI cN0 M0 stage IA   10/04/2012 Surgery Right breast lumpectomy 1.6 cm IDC grade 2 with associated intermediate grade DCIS tumor involving the dermis and lymphovascular invasion ER 100%, PR 100%, just some 45%, HER-2 positive, 0/5 lymph nodes   11/19/2012 - 10/23/2013 Chemotherapy Adjuvant chemotherapy: [place] complicated by a fall 16/02/930 fracture required surgery) resume chemotherapy 12/10/2012 completed [facility] 02/18/2013, Herceptin maintenance completed 10/23/2013   04/01/2013 - 05/14/2013 Radiation Therapy Adjuvant radiation therapy by Dr. Isidore Moos   05/28/2013 -  Anti-estrogen oral therapy Letrozole 2.5 mg daily    CHIEF COMPLIANT: follow-up on letrozole for breast cancer  INTERVAL HISTORY: Angela Rosario is a 75 year old with above-mentioned history of right breast cancer diagnosed in 2014 treated with lumpectomy adjuvant chemotherapy adjuvant radiation therapy and is currently on letrozole. She appears to be tolerating that extremely well. She does have vaginal dryness. Denies any hot flashes or myalgias. Denies any lumps or nodules in the breasts.she has intermittent chest discomfort which most likely is musculoskeletal in nature but it is so intense that it takes her  breath away.  REVIEW OF SYSTEMS:   Constitutional: Denies fevers, chills or abnormal weight loss Eyes: Denies blurriness of vision Ears, nose, mouth, throat, and face: Denies mucositis or sore throat Respiratory: Denies cough, dyspnea or wheezes Cardiovascular: Denies palpitation, chest discomfort Gastrointestinal:  Denies nausea, heartburn or change in bowel habits Skin: Denies abnormal skin rashes Lymphatics: Denies new lymphadenopathy or easy bruising Neurological:Denies numbness, tingling or new weaknesses Behavioral/Psych: Mood is stable, no new changes  Extremities: No lower extremity edema Breast:  denies any pain or lumps or nodules in either breasts All other systems were reviewed with the patient and are negative.  I have reviewed the past medical history, past surgical history, social history and family history with the patient and they are unchanged from previous note.  ALLERGIES:  is allergic to oxycodone.  MEDICATIONS:  Current Outpatient Prescriptions  Medication Sig Dispense Refill  . carvedilol (COREG) 6.25 MG tablet Take 1 tablet (6.25 mg total) by mouth 2 (two) times daily with a meal. 180 tablet 3  . cyanocobalamin 1000 MCG tablet Take 100 mcg by mouth daily.    Marland Kitchen glyBURIDE (DIABETA) 2.5 MG tablet Take 2.5 mg by mouth daily with breakfast.    . letrozole (FEMARA) 2.5 MG tablet Take 1 tablet (2.5 mg total) by mouth daily. 90 tablet 3  . losartan (COZAAR) 25 MG tablet TAKE 1 TABLET ONCE DAILY. 30 tablet 0  . lovastatin (MEVACOR) 10 MG tablet Take 10 mg by mouth at bedtime.     . metFORMIN (GLUCOPHAGE) 500 MG tablet 500 mg daily with breakfast.   5  .  Multiple Vitamin (MULTIVITAMIN) capsule Take 1 capsule by mouth daily.     No current facility-administered medications for this visit.   Facility-Administered Medications Ordered in Other Visits  Medication Dose Route Frequency Provider Last Rate Last Dose  . gentamicin (GARAMYCIN) 160 mg in dextrose 5 % 50 mL IVPB   160 mg Intravenous 30 min Pre-Op Franchot Gallo, MD        PHYSICAL EXAMINATION: ECOG PERFORMANCE STATUS: 0 - Asymptomatic  Filed Vitals:   02/27/15 0912  BP: 124/70  Pulse: 73  Temp: 98.1 F (36.7 C)  Resp: 18   Filed Weights   02/27/15 0912  Weight: 190 lb (86.183 kg)    GENERAL:alert, no distress and comfortable SKIN: skin color, texture, turgor are normal, no rashes or significant lesions EYES: normal, Conjunctiva are pink and non-injected, sclera clear OROPHARYNX:no exudate, no erythema and lips, buccal mucosa, and tongue normal  NECK: supple, thyroid normal size, non-tender, without nodularity LYMPH:  no palpable lymphadenopathy in the cervical, axillary or inguinal LUNGS: clear to auscultation and percussion with normal breathing effort HEART: regular rate & rhythm and no murmurs and no lower extremity edema ABDOMEN:abdomen soft, non-tender and normal bowel sounds MUSCULOSKELETAL:no cyanosis of digits and no clubbing  NEURO: alert & oriented x 3 with fluent speech, no focal motor/sensory deficits EXTREMITIES: No lower extremity edema BREAST: No palpable masses or nodules in either right or left breasts. No palpable axillary supraclavicular or infraclavicular adenopathy no breast tenderness or nipple discharge. (exam performed in the presence of a chaperone)  LABORATORY DATA:  I have reviewed the data as listed   Chemistry      Component Value Date/Time   NA 141 02/27/2015 0858   NA 135 11/01/2012 1350   K 4.4 02/27/2015 0858   K 4.2 11/01/2012 1350   CL 96 11/01/2012 1350   CO2 26 02/27/2015 0858   CO2 26 11/01/2012 1350   BUN 15.4 02/27/2015 0858   BUN 10 11/01/2012 1350   CREATININE 0.9 02/27/2015 0858   CREATININE 0.64 11/01/2012 1350      Component Value Date/Time   CALCIUM 9.4 02/27/2015 0858   CALCIUM 9.7 11/01/2012 1350   ALKPHOS 89 02/27/2015 0858   ALKPHOS 77 02/17/2011 1045   AST 14 02/27/2015 0858   AST 26 02/17/2011 1045   ALT 16  02/27/2015 0858   ALT 36* 02/17/2011 1045   BILITOT 0.91 02/27/2015 0858   BILITOT 0.5 02/17/2011 1045       Lab Results  Component Value Date   WBC 7.2 08/05/2014   HGB 14.1 08/05/2014   HCT 41.1 08/05/2014   MCV 90.9 08/05/2014   PLT 252 08/05/2014   NEUTROABS 4.7 08/05/2014   ASSESSMENT & PLAN:  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 Status post lumpectomy 10/04/2012 1.6 cm 0/5 lymph nodes T1 cN0 M0 stage IA status post adjuvant chemotherapy with [place] followed by Herceptin maintenance completed 10/23/2013, adjuvant radiation therapy was also complete and is now on adjuvant antiestrogen therapy with Femara started April 2015.  Aromatase inhibitor toxicities: vaginal dryness.  Surveillance: 1. Breast exam 02/27/15 normal 2. Mammogram 10/18/2013 normal. I have requested her annual mammogram to be scheduled at [facility] in September 2016. 3. Bone density April 2015: normal T score +3.6 in the spine No clinical or radiological evidence of disease relapse.  Chest discomfort: Intermittent sharp  pain that dates her breath away. I will obtain a CT of the chest for further evaluation. I will call her with the result of this test.  return to clinic in 6 months for follow-up and after that we will see her once a year  Orders Placed This Encounter  Procedures  . CT Chest W Contrast    Standing Status: Future     Number of Occurrences:      Standing Expiration Date: 02/27/2016    Order Specific Question:  If indicated for the ordered procedure, I authorize the administration of contrast media per Radiology protocol    Answer:  Yes    Order Specific Question:  Reason for Exam (SYMPTOM  OR DIAGNOSIS REQUIRED)    Answer:  Chest discomfort with H/O breast cancer    Order Specific Question:  Preferred imaging location?    Answer:   [facility]   The patient has a good understanding of the overall plan. she agrees with it. she will call with any problems that may develop before the next visit here.   Rulon Eisenmenger, MD 02/27/2015

## 2015-02-27 NOTE — Telephone Encounter (Signed)
Appointments made and avs printed for patient °

## 2015-03-13 ENCOUNTER — Ambulatory Visit (HOSPITAL_COMMUNITY)
Admission: RE | Admit: 2015-03-13 | Discharge: 2015-03-13 | Disposition: A | Discharge status: Home / Self Care | Payer: Medicare Other | Source: Ambulatory Visit | Attending: Hematology and Oncology | Admitting: Hematology and Oncology

## 2015-03-13 ENCOUNTER — Encounter (HOSPITAL_COMMUNITY): Payer: Self-pay

## 2015-03-13 DIAGNOSIS — C50111 Malignant neoplasm of central portion of right female breast: Secondary | ICD-10-CM | POA: Diagnosis not present

## 2015-03-13 DIAGNOSIS — Z9049 Acquired absence of other specified parts of digestive tract: Secondary | ICD-10-CM | POA: Diagnosis not present

## 2015-03-13 DIAGNOSIS — I700 Atherosclerosis of aorta: Secondary | ICD-10-CM | POA: Diagnosis not present

## 2015-03-13 DIAGNOSIS — R079 Chest pain, unspecified: Secondary | ICD-10-CM | POA: Insufficient documentation

## 2015-03-13 MED ORDER — IOHEXOL 300 MG/ML  SOLN
75.0000 mL | Freq: Once | INTRAMUSCULAR | Status: AC | PRN
  Administered 2015-03-13: 75 mL via INTRAVENOUS

## 2015-03-19 ENCOUNTER — Other Ambulatory Visit: Payer: Self-pay | Admitting: Internal Medicine

## 2015-03-22 IMAGING — CR DG CHEST 1V PORT
1 series · 1 of 1 positions shown · non-contrast
Comparison: 02/17/2011.

CLINICAL DATA: Central line placement.

PORTABLE CHEST - 1 VIEW

[view not recorded]
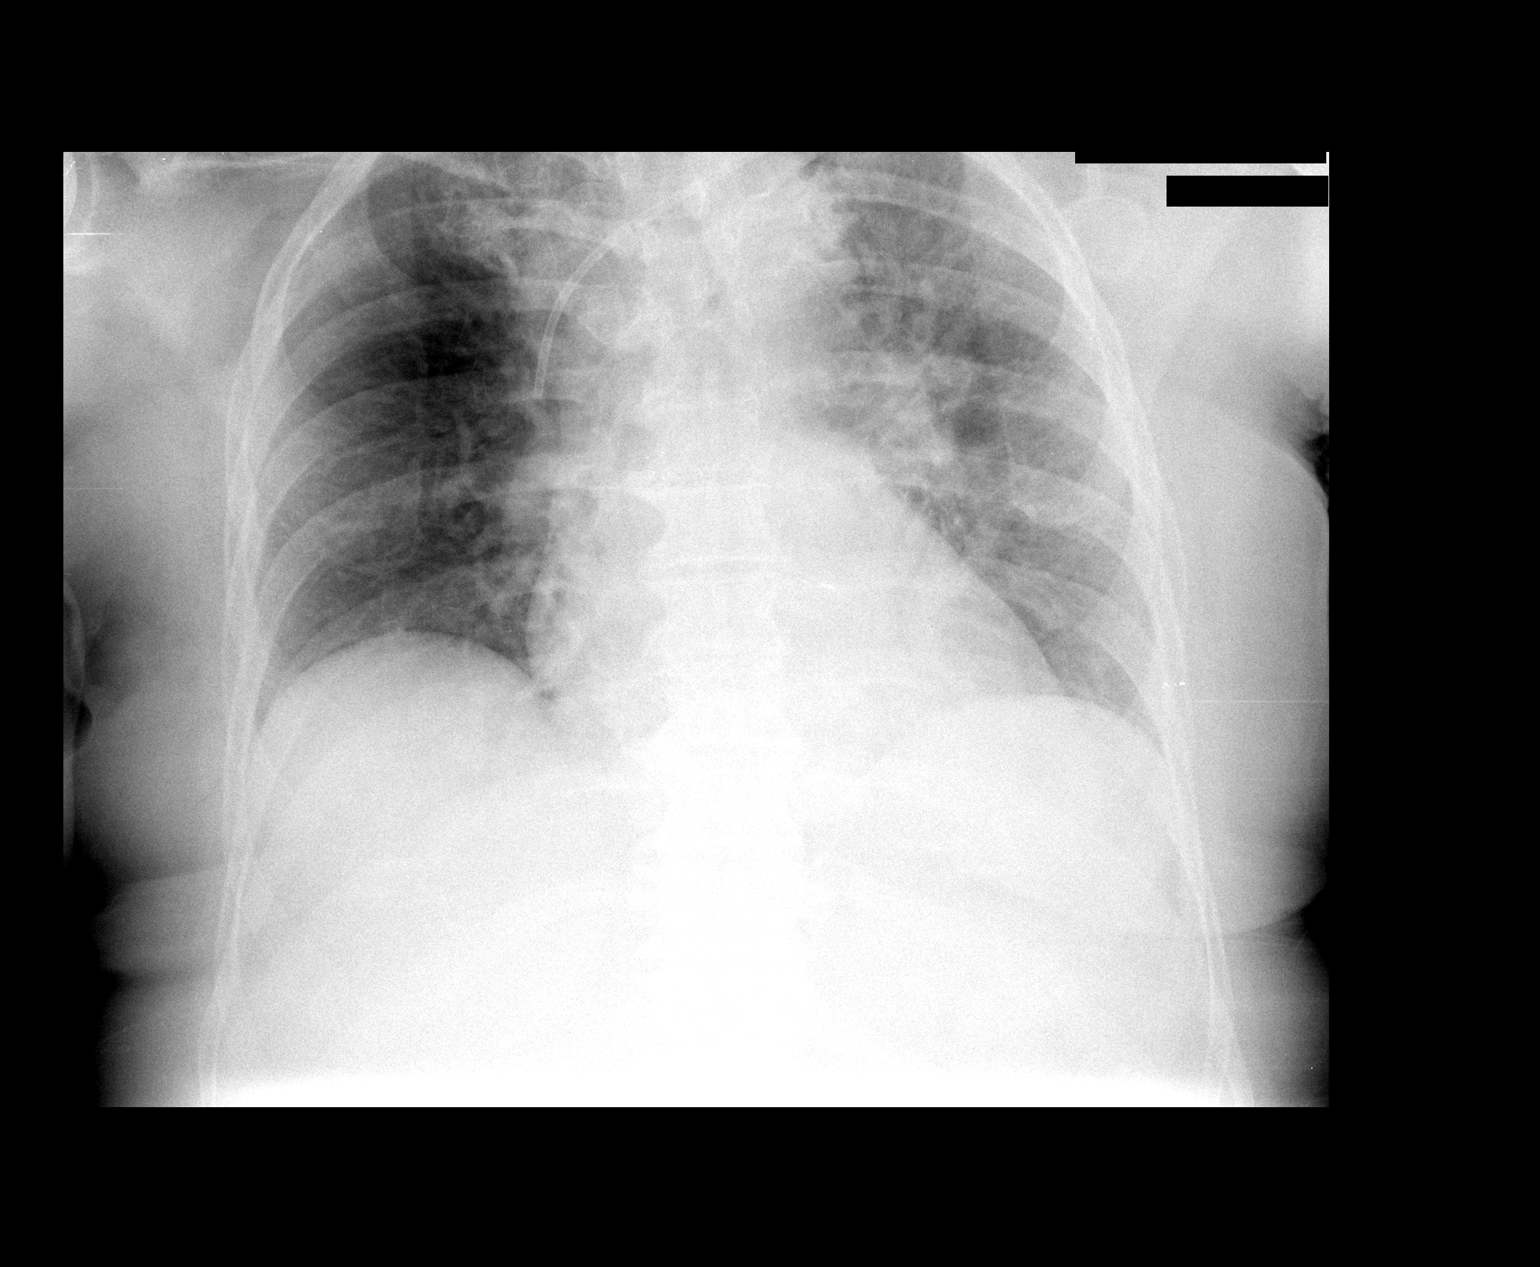

[1 of 1 positions shown; findings below may reference images not displayed]

FINDINGS: Left central line tip proximal to mid superior vena cava
level.

Prominence of the mediastinum.  This may be related to AP portable
technique however, postoperative complication not excluded.

The lung apices are not entirely included present examination.  No
gross pneumothorax.

Asymmetric pulmonary vascular congestion more prominent on the
left.

Heart slightly enlarged.
IMPRESSION: Left central line tip proximal to mid superior vena cava level.

Prominence of the mediastinum.  This may be related to AP portable
technique however, postoperative complication not excluded.
Recommend follow-up two-view chest for further delineation.

The lung apices are not entirely included present examination.  No
gross pneumothorax.

Asymmetric pulmonary vascular congestion more prominent on the
left.

Heart slightly enlarged.

This is a call report.

## 2015-04-15 ENCOUNTER — Other Ambulatory Visit: Payer: Self-pay | Admitting: Internal Medicine

## 2015-05-08 DIAGNOSIS — E119 Type 2 diabetes mellitus without complications: Secondary | ICD-10-CM | POA: Diagnosis not present

## 2015-05-08 DIAGNOSIS — Z6830 Body mass index (BMI) 30.0-30.9, adult: Secondary | ICD-10-CM | POA: Diagnosis not present

## 2015-05-20 ENCOUNTER — Other Ambulatory Visit: Payer: Self-pay | Admitting: Internal Medicine

## 2015-06-05 ENCOUNTER — Encounter: Payer: Self-pay | Admitting: Internal Medicine

## 2015-06-22 ENCOUNTER — Other Ambulatory Visit: Payer: Self-pay | Admitting: Internal Medicine

## 2015-07-16 IMAGING — CR DG CHEST 2V
2 series · 2 of 2 positions shown · non-contrast
Comparison: 10/04/2012

CLINICAL DATA: Cough, chest congestion, fever, shortness of breath,
on chemotherapy for breast cancer, history hypertension, diabetes

EXAM:
CHEST  2 VIEW

[w chest pa]
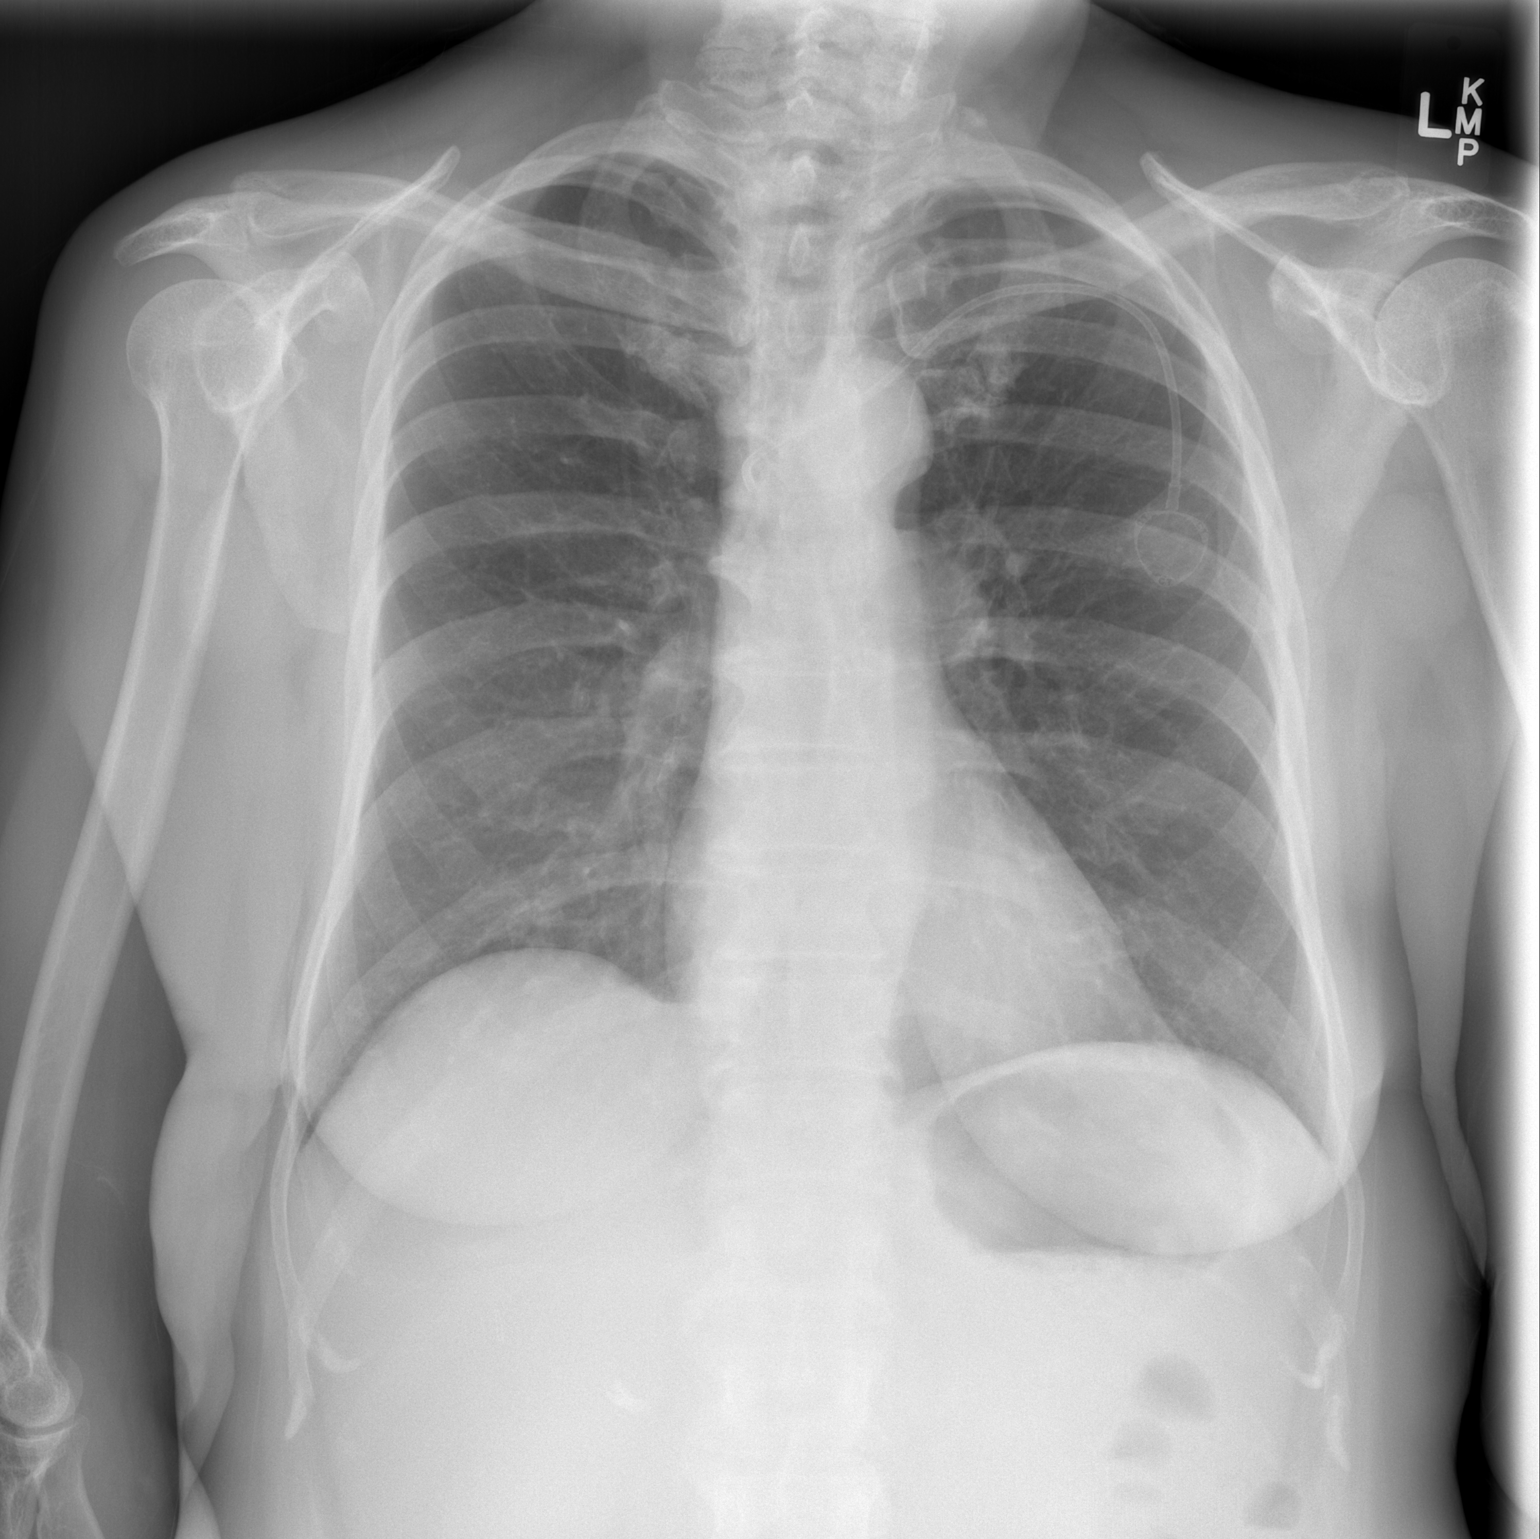

[w chest lat]
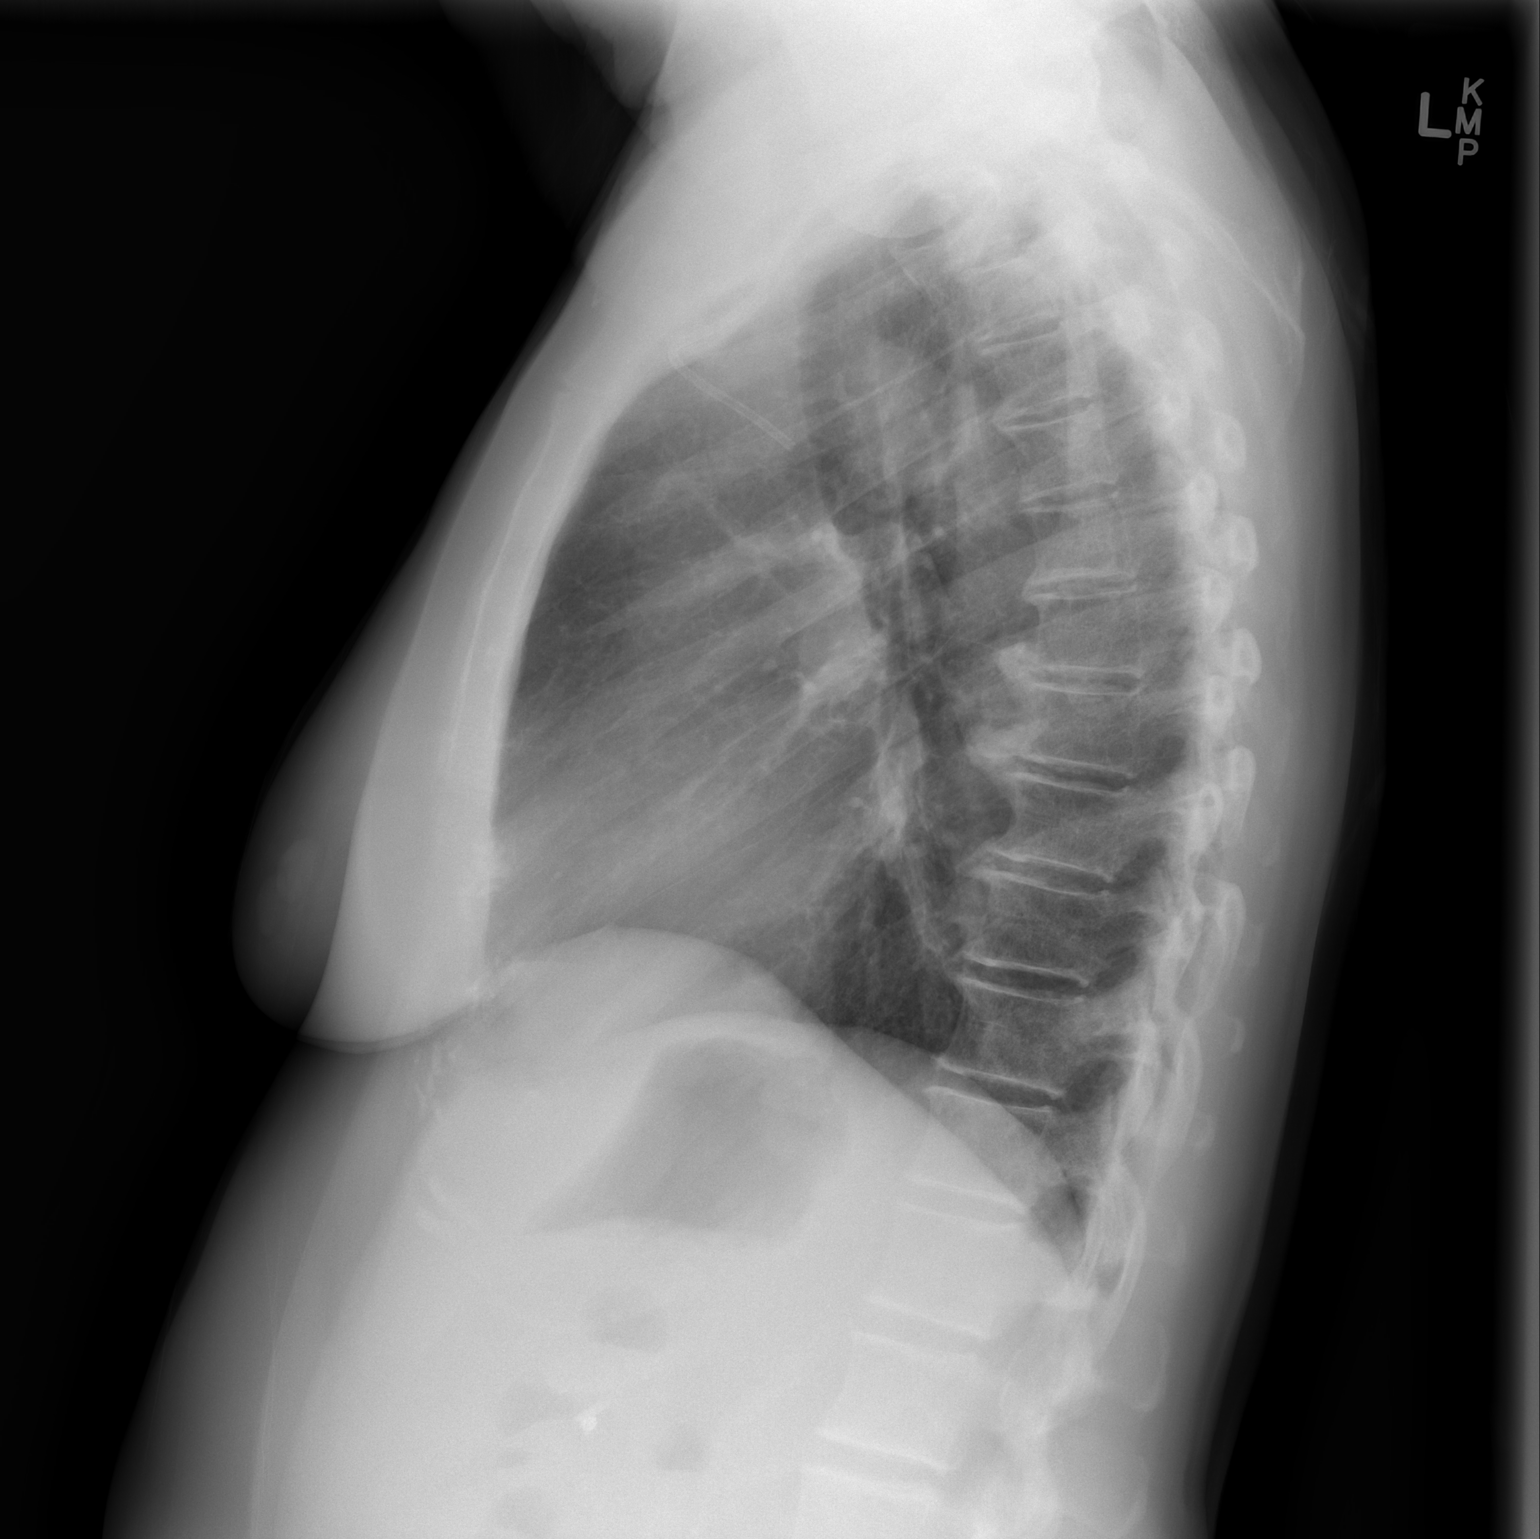

[2 of 2 positions shown; findings below may reference images not displayed]

FINDINGS: Left subclavian Port-A-Cath with tip directed posteriorly likely
extending into the azygos vein near the SVC confluence.

Normal heart size, mediastinal contours, and pulmonary vascularity.

Lungs clear.

No pleural effusion or pneumothorax.

Scattered endplate spur formation thoracic spine.
IMPRESSION: No acute abnormalities.

Tip of the Port-A-Cath catheter is likely within the azygos vein
near the SVC confluence.

## 2015-07-30 DIAGNOSIS — I5189 Other ill-defined heart diseases: Secondary | ICD-10-CM | POA: Diagnosis not present

## 2015-07-30 DIAGNOSIS — E7800 Pure hypercholesterolemia, unspecified: Secondary | ICD-10-CM | POA: Diagnosis not present

## 2015-07-30 DIAGNOSIS — G629 Polyneuropathy, unspecified: Secondary | ICD-10-CM | POA: Diagnosis not present

## 2015-07-30 DIAGNOSIS — I509 Heart failure, unspecified: Secondary | ICD-10-CM | POA: Diagnosis not present

## 2015-07-30 DIAGNOSIS — D692 Other nonthrombocytopenic purpura: Secondary | ICD-10-CM | POA: Diagnosis not present

## 2015-07-30 DIAGNOSIS — H3552 Pigmentary retinal dystrophy: Secondary | ICD-10-CM | POA: Diagnosis not present

## 2015-07-30 DIAGNOSIS — I110 Hypertensive heart disease with heart failure: Secondary | ICD-10-CM | POA: Diagnosis not present

## 2015-07-30 DIAGNOSIS — E119 Type 2 diabetes mellitus without complications: Secondary | ICD-10-CM | POA: Diagnosis not present

## 2015-07-30 DIAGNOSIS — E668 Other obesity: Secondary | ICD-10-CM | POA: Diagnosis not present

## 2015-07-30 DIAGNOSIS — I10 Essential (primary) hypertension: Secondary | ICD-10-CM | POA: Diagnosis not present

## 2015-08-05 DIAGNOSIS — H3552 Pigmentary retinal dystrophy: Secondary | ICD-10-CM | POA: Diagnosis not present

## 2015-08-05 DIAGNOSIS — H35372 Puckering of macula, left eye: Secondary | ICD-10-CM | POA: Diagnosis not present

## 2015-08-13 IMAGING — CR DG FOREARM 2V*L*
2 series · 2 of 2 positions shown · non-contrast
Comparison: None.

CLINICAL DATA: Left hand numbness. Left wrist surgery 5 months ago.

EXAM:
LEFT FOREARM - 2 VIEW

[x forearm ap left]
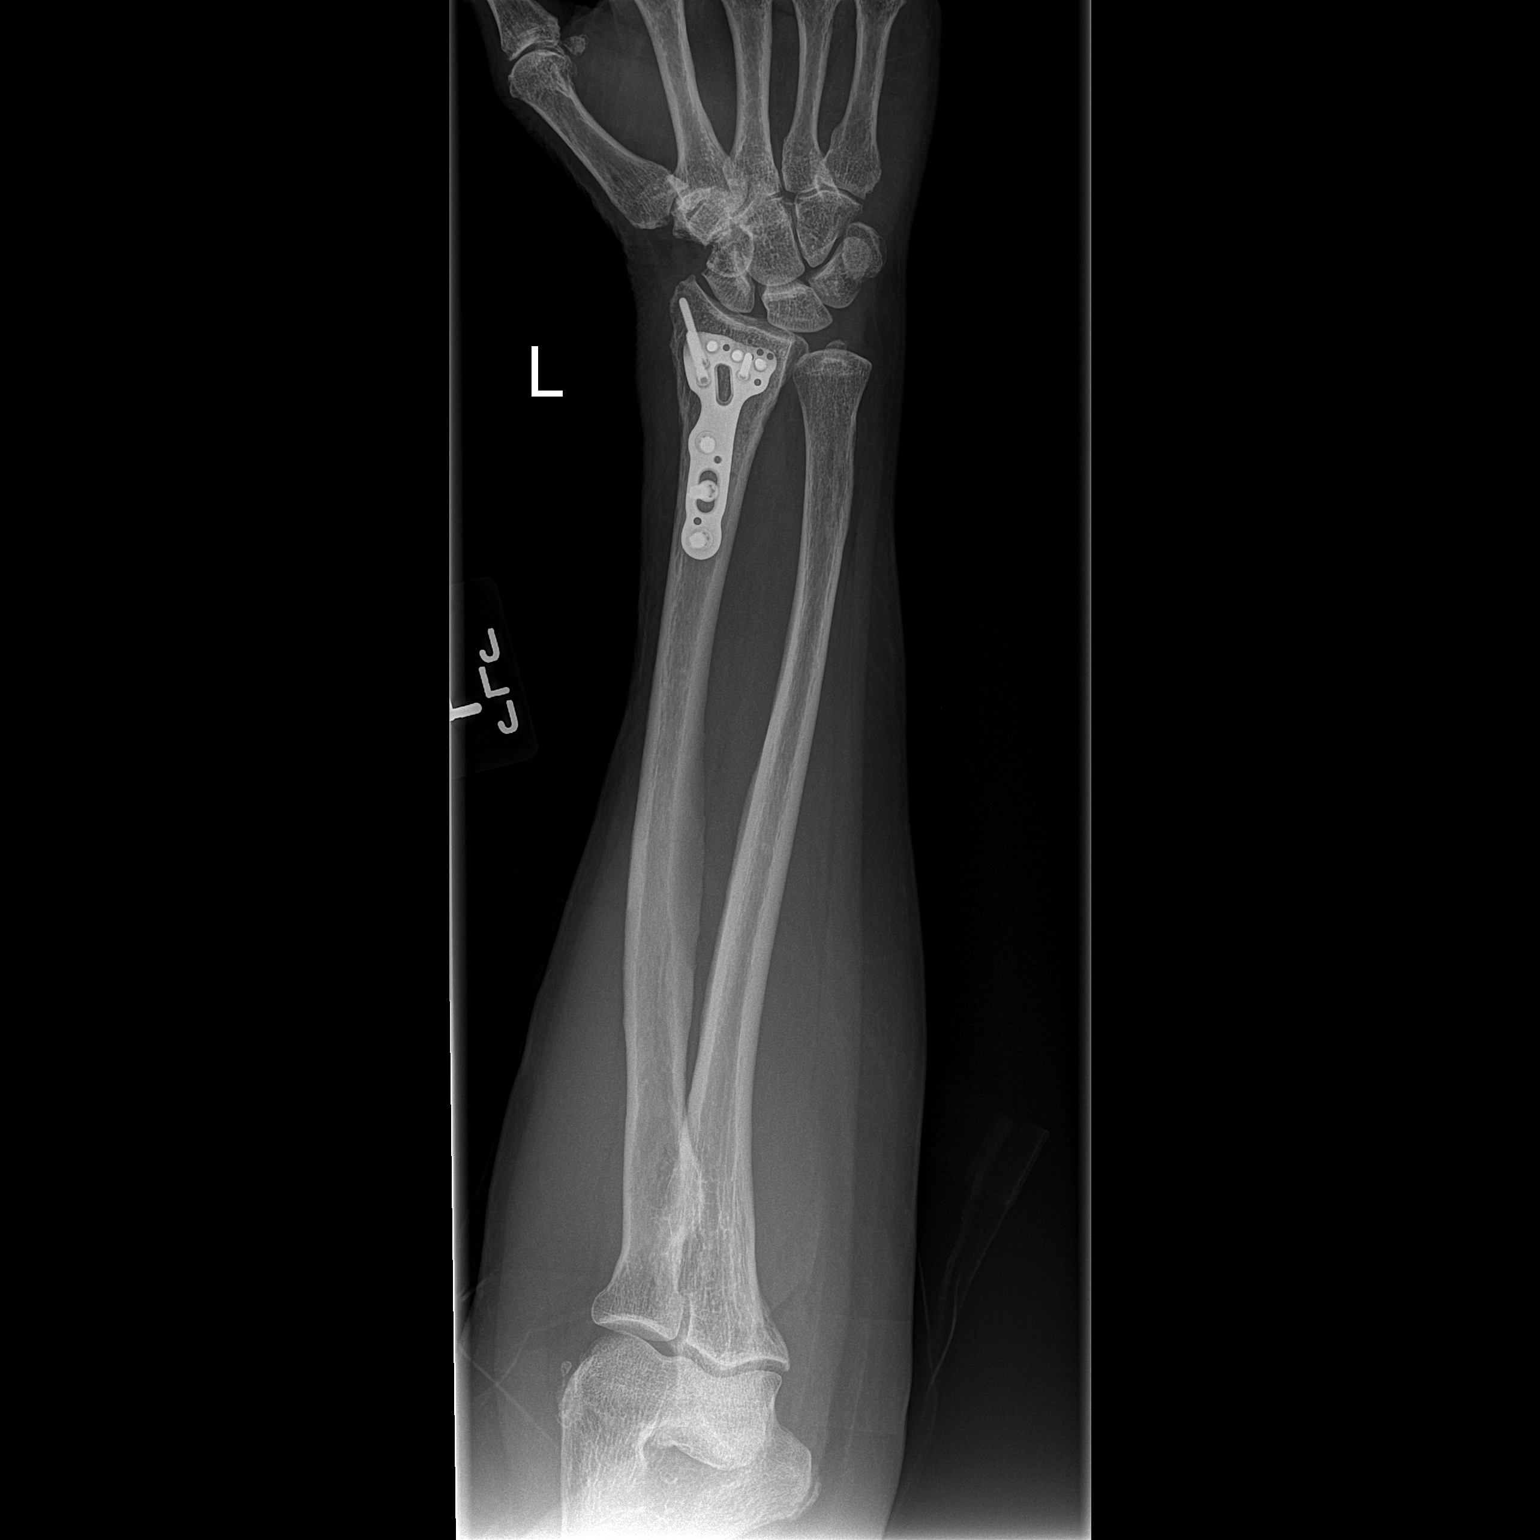

[x forearm lat left]
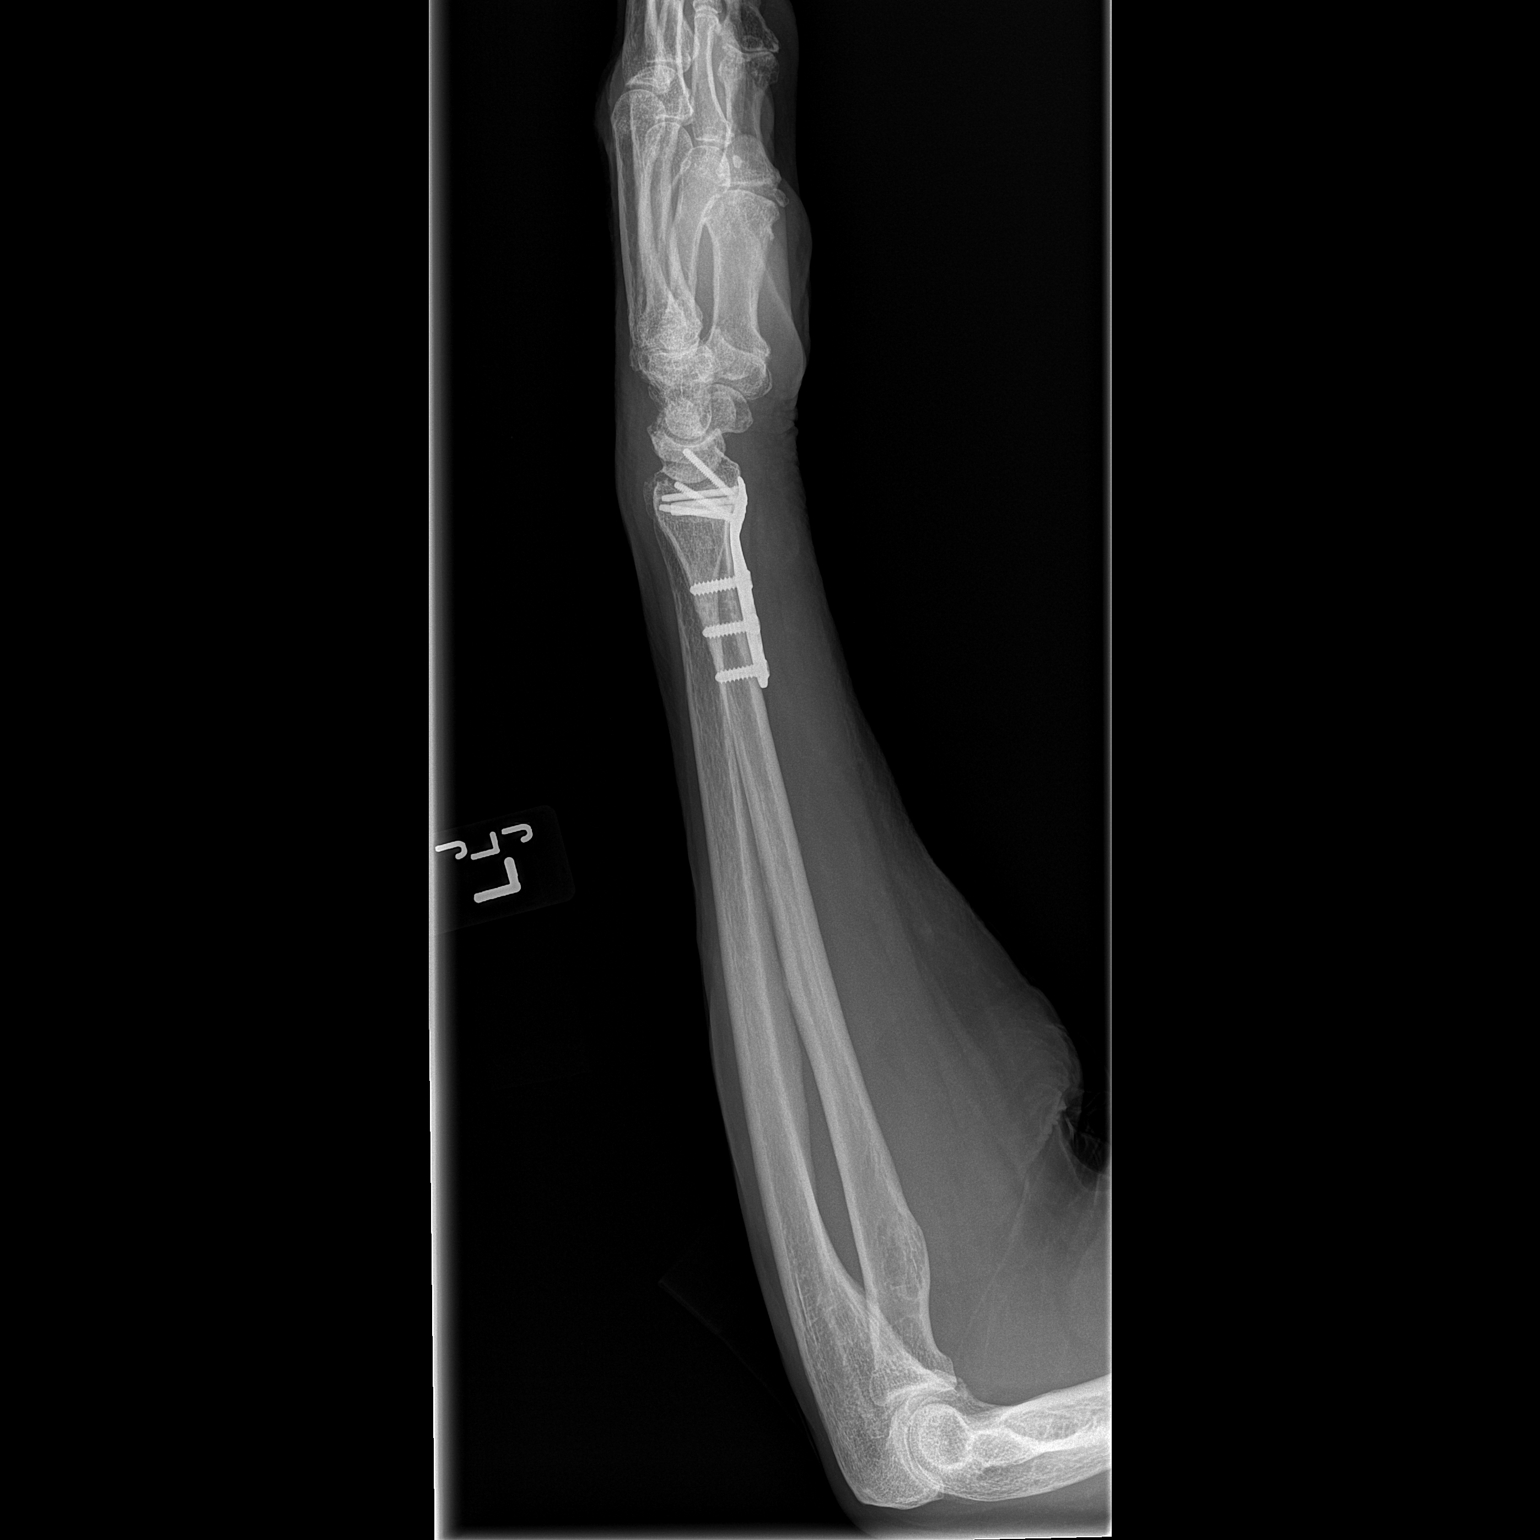

[2 of 2 positions shown; findings below may reference images not displayed]

FINDINGS: There is a malleable plate and screws transfixing a healed distal
radial metaphysis fracture in anatomic alignment. There is no
hardware failure or complication. There is no other fracture or
dislocation. The soft tissues are normal.
IMPRESSION: No acute osseous injury of the left forearm.

## 2015-08-28 ENCOUNTER — Telehealth: Payer: Self-pay | Admitting: Hematology and Oncology

## 2015-08-28 ENCOUNTER — Encounter: Payer: Self-pay | Admitting: Hematology and Oncology

## 2015-08-28 ENCOUNTER — Ambulatory Visit (HOSPITAL_BASED_OUTPATIENT_CLINIC_OR_DEPARTMENT_OTHER): Payer: Medicare Other | Admitting: Hematology and Oncology

## 2015-08-28 VITALS — BP 126/68 | HR 66 | Temp 98.2°F | Resp 18 | Ht 66.0 in | Wt 180.3 lb

## 2015-08-28 DIAGNOSIS — Z170 Estrogen receptor positive status [ER+]: Secondary | ICD-10-CM

## 2015-08-28 DIAGNOSIS — Z79811 Long term (current) use of aromatase inhibitors: Secondary | ICD-10-CM

## 2015-08-28 DIAGNOSIS — C50111 Malignant neoplasm of central portion of right female breast: Secondary | ICD-10-CM

## 2015-08-28 NOTE — Progress Notes (Signed)
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 ([place])   09/11/2012 Mammogram Right breast mass 7 mm at 12:00 position   09/12/2012 Initial Biopsy Invasive mammary cancer with lobular features grade 2 ER 100%, PR percent, TSH some 45%, HER-2 positive ratio 2.96   09/18/2012 Breast MRI Right breast mass 1.5 x 1.7 x 1.5 cm, no abnormal lymph nodes clinical stage TI cN0 M0 stage IA   10/04/2012 Surgery Right breast lumpectomy 1.6 cm IDC grade 2 with associated intermediate grade DCIS tumor involving the dermis and lymphovascular invasion ER 100%, PR 100%, just some 45%, HER-2 positive, 0/5 lymph nodes   11/19/2012 - 10/23/2013 Chemotherapy Adjuvant chemotherapy: [place] complicated by a fall 16/11/9602 fracture required surgery) resume chemotherapy 12/10/2012 completed [facility] 02/18/2013, Herceptin maintenance completed 10/23/2013   04/01/2013 - 05/14/2013 Radiation Therapy Adjuvant radiation therapy by Dr. Isidore Moos   05/28/2013 -  Anti-estrogen oral therapy Letrozole 2.5 mg daily    CHIEF COMPLIANT: Follow-up on letrozole  INTERVAL HISTORY: Angela Rosario is a 75 year old with above-mentioned history of right breast cancer currently on adjuvant letrozole. She appears to be tolerating extremely well. She does have vaginal dryness. She denies any breast pain or discomfort. Mammograms on October 2016 1 normal. Metformin was discontinued because it was causing abdominal cramping and diarrhea. Since then she is doing a lot better. She had lost about 10 pounds and feels great. Her arthritis is also resolved.  REVIEW OF SYSTEMS:     Constitutional: Denies fevers, chills or abnormal weight loss Eyes: Denies blurriness of vision Ears, nose, mouth, throat, and face: Denies mucositis or sore throat Respiratory: Denies cough, dyspnea or wheezes Cardiovascular: Denies palpitation, chest discomfort Gastrointestinal:  Denies nausea, heartburn or change in bowel habits Skin: Denies abnormal skin rashes Lymphatics: Denies new lymphadenopathy or easy bruising Neurological:Denies numbness, tingling or new weaknesses Behavioral/Psych: Mood is stable, no new changes  Extremities: No lower extremity edema Breast:  denies any pain or lumps or nodules in either breasts All other systems were reviewed with the patient and are negative.  I have reviewed the past medical history, past surgical history, social history and family history with the patient and they are unchanged from previous note.  ALLERGIES:  is allergic to oxycodone.  MEDICATIONS:  Current Outpatient Prescriptions  Medication Sig Dispense Refill  . carvedilol (COREG) 6.25 MG tablet TAKE 1 TABLET TWICE DAILY WITH A MEAL. 180 tablet 3  . cyanocobalamin 1000 MCG tablet Take 100 mcg by mouth daily.    Marland Kitchen glyBURIDE (DIABETA) 2.5 MG tablet Take 2.5 mg by mouth daily with breakfast.    . letrozole (FEMARA) 2.5 MG tablet Take 1 tablet (2.5 mg total) by mouth daily. 90 tablet 3  . losartan (COZAAR) 25 MG tablet TAKE 1 TABLET ONCE DAILY. 30 tablet 3  . lovastatin (MEVACOR) 10 MG tablet Take 10 mg by mouth at bedtime.     . metFORMIN (GLUCOPHAGE) 500 MG tablet 500 mg daily with breakfast.   5  . Multiple Vitamin (MULTIVITAMIN) capsule Take 1 capsule by mouth daily.  No current facility-administered medications for this visit.   Facility-Administered Medications Ordered in Other Visits  Medication Dose Route Frequency Provider Last Rate Last Dose  . gentamicin (GARAMYCIN) 160 mg in dextrose 5 % 50 mL IVPB  160 mg Intravenous 30 min Pre-Op Franchot Gallo, MD         PHYSICAL EXAMINATION: ECOG PERFORMANCE STATUS: 1 - Symptomatic but completely ambulatory  Filed Vitals:   08/28/15 1016  BP: 126/68  Pulse: 66  Temp: 98.2 F (36.8 C)  Resp: 18   Filed Weights   08/28/15 1016  Weight: 180 lb 4.8 oz (81.784 kg)    GENERAL:alert, no distress and comfortable SKIN: skin color, texture, turgor are normal, no rashes or significant lesions EYES: normal, Conjunctiva are pink and non-injected, sclera clear OROPHARYNX:no exudate, no erythema and lips, buccal mucosa, and tongue normal  NECK: supple, thyroid normal size, non-tender, without nodularity LYMPH:  no palpable lymphadenopathy in the cervical, axillary or inguinal LUNGS: clear to auscultation and percussion with normal breathing effort HEART: regular rate & rhythm and no murmurs and no lower extremity edema ABDOMEN:abdomen soft, non-tender and normal bowel sounds MUSCULOSKELETAL:no cyanosis of digits and no clubbing  NEURO: alert & oriented x 3 with fluent speech, no focal motor/sensory deficits EXTREMITIES: No lower extremity edema BREAST: Be very cautious No palpable masses or nodules in either right or left breasts. No palpable axillary supraclavicular or infraclavicular adenopathy no breast tenderness or nipple discharge. (exam performed in the presence of a chaperone)  LABORATORY DATA:  I have reviewed the data as listed   Chemistry      Component Value Date/Time   NA 141 02/27/2015 0858   NA 135 11/01/2012 1350   K 4.4 02/27/2015 0858   K 4.2 11/01/2012 1350   CL 96 11/01/2012 1350   CO2 26 02/27/2015 0858   CO2 26 11/01/2012 1350   BUN 15.4 02/27/2015 0858   BUN 10 11/01/2012 1350   CREATININE 0.9 02/27/2015 0858   CREATININE 0.64 11/01/2012 1350      Component Value Date/Time   CALCIUM 9.4 02/27/2015 0858   CALCIUM 9.7 11/01/2012 1350   ALKPHOS 89 02/27/2015 0858   ALKPHOS 77 02/17/2011 1045   AST 14 02/27/2015 0858   AST 26 02/17/2011 1045   ALT 16 02/27/2015 0858    ALT 36* 02/17/2011 1045   BILITOT 0.91 02/27/2015 0858   BILITOT 0.5 02/17/2011 1045       Lab Results  Component Value Date   WBC 7.2 08/05/2014   HGB 14.1 08/05/2014   HCT 41.1 08/05/2014   MCV 90.9 08/05/2014   PLT 252 08/05/2014   NEUTROABS 4.7 08/05/2014     ASSESSMENT & PLAN:  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 Status post lumpectomy 10/04/2012 1.6 cm 0/5 lymph nodes T1 cN0 M0 stage IA status post adjuvant chemotherapy with [place] followed by Herceptin maintenance completed 10/23/2013, adjuvant radiation therapy was also complete and is now on adjuvant antiestrogen therapy with Femara started April 2015.  Aromatase inhibitor toxicities: vaginal dryness.  Surveillance: 1. Breast exam 08/28/2015 benign 2. Mammogram 11/26/2014 benign. 3. Bone density April 2015: normal T score +3.6 in the spine No clinical or radiological evidence of disease relapse.  Chest discomfort: CT chest 03/13/2015: No evidence of metastatic disease, gastric fundus varices could be due to portal hypertension, partial visualization of  chronic intrahepatic and extrahepatic biliary dilatation prior cholecystectomy  Return to clinic in 1 year   No orders of the defined types were placed in this encounter.   The patient has a good understanding of the overall plan. she agrees with it. she will call with any problems that may develop before the next visit here.   Rulon Eisenmenger, MD 08/28/2015

## 2015-08-28 NOTE — Telephone Encounter (Signed)
appt made and avs printed °

## 2015-08-28 NOTE — Assessment & Plan Note (Signed)
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 Status post lumpectomy 10/04/2012 1.6 cm 0/5 lymph nodes T1 cN0 M0 stage IA status post adjuvant chemotherapy with [place] followed by Herceptin maintenance completed 10/23/2013, adjuvant radiation therapy was also complete and is now on adjuvant antiestrogen therapy with Femara started April 2015.  Aromatase inhibitor toxicities: vaginal dryness.  Surveillance: 1. Breast exam 08/28/2015 benign 2. Mammogram 11/26/2014 benign. 3. Bone density April 2015: normal T score +3.6 in the spine No clinical or radiological evidence of disease relapse.  Chest discomfort: CT chest 03/13/2015: No evidence of metastatic disease, gastric fundus varices could be due to portal hypertension, partial visualization of chronic intrahepatic and extrahepatic biliary dilatation prior cholecystectomy  Return to clinic in 1 year

## 2015-08-31 ENCOUNTER — Other Ambulatory Visit: Payer: Self-pay | Admitting: *Deleted

## 2015-08-31 ENCOUNTER — Other Ambulatory Visit: Payer: Self-pay | Admitting: Oncology

## 2015-08-31 DIAGNOSIS — C50111 Malignant neoplasm of central portion of right female breast: Secondary | ICD-10-CM

## 2015-08-31 MED ORDER — LETROZOLE 2.5 MG PO TABS: 2.5000 mg | ORAL_TABLET | Freq: Every day | ORAL | Status: DC

## 2015-08-31 NOTE — Telephone Encounter (Signed)
Chart reviewed.

## 2015-08-31 NOTE — Telephone Encounter (Signed)
Faxed refill request received for letrozole for this patient.

## 2015-09-24 ENCOUNTER — Other Ambulatory Visit: Payer: Self-pay | Admitting: Internal Medicine

## 2015-10-01 DIAGNOSIS — L570 Actinic keratosis: Secondary | ICD-10-CM | POA: Diagnosis not present

## 2015-10-01 DIAGNOSIS — Z85828 Personal history of other malignant neoplasm of skin: Secondary | ICD-10-CM | POA: Diagnosis not present

## 2015-10-01 DIAGNOSIS — D485 Neoplasm of uncertain behavior of skin: Secondary | ICD-10-CM | POA: Diagnosis not present

## 2015-10-27 DIAGNOSIS — H608X9 Other otitis externa, unspecified ear: Secondary | ICD-10-CM | POA: Diagnosis not present

## 2015-10-28 ENCOUNTER — Other Ambulatory Visit: Payer: Self-pay | Admitting: Internal Medicine

## 2015-11-19 DIAGNOSIS — H25042 Posterior subcapsular polar age-related cataract, left eye: Secondary | ICD-10-CM | POA: Diagnosis not present

## 2015-11-19 DIAGNOSIS — H2513 Age-related nuclear cataract, bilateral: Secondary | ICD-10-CM | POA: Diagnosis not present

## 2015-11-19 DIAGNOSIS — E119 Type 2 diabetes mellitus without complications: Secondary | ICD-10-CM | POA: Diagnosis not present

## 2015-12-03 DIAGNOSIS — R928 Other abnormal and inconclusive findings on diagnostic imaging of breast: Secondary | ICD-10-CM | POA: Diagnosis not present

## 2015-12-03 DIAGNOSIS — Z780 Asymptomatic menopausal state: Secondary | ICD-10-CM | POA: Diagnosis not present

## 2015-12-04 DIAGNOSIS — H3552 Pigmentary retinal dystrophy: Secondary | ICD-10-CM | POA: Diagnosis not present

## 2015-12-04 DIAGNOSIS — H53483 Generalized contraction of visual field, bilateral: Secondary | ICD-10-CM | POA: Diagnosis not present

## 2015-12-08 DIAGNOSIS — H53483 Generalized contraction of visual field, bilateral: Secondary | ICD-10-CM | POA: Diagnosis not present

## 2015-12-08 DIAGNOSIS — H3552 Pigmentary retinal dystrophy: Secondary | ICD-10-CM | POA: Diagnosis not present

## 2015-12-18 DIAGNOSIS — H3552 Pigmentary retinal dystrophy: Secondary | ICD-10-CM | POA: Diagnosis not present

## 2015-12-18 DIAGNOSIS — H53483 Generalized contraction of visual field, bilateral: Secondary | ICD-10-CM | POA: Diagnosis not present

## 2015-12-25 DIAGNOSIS — H3552 Pigmentary retinal dystrophy: Secondary | ICD-10-CM | POA: Diagnosis not present

## 2015-12-25 DIAGNOSIS — H53483 Generalized contraction of visual field, bilateral: Secondary | ICD-10-CM | POA: Diagnosis not present

## 2016-01-13 DIAGNOSIS — I10 Essential (primary) hypertension: Secondary | ICD-10-CM | POA: Diagnosis not present

## 2016-01-13 DIAGNOSIS — E7800 Pure hypercholesterolemia, unspecified: Secondary | ICD-10-CM | POA: Diagnosis not present

## 2016-01-13 DIAGNOSIS — Z0000 Encounter for general adult medical examination without abnormal findings: Secondary | ICD-10-CM | POA: Diagnosis not present

## 2016-01-13 DIAGNOSIS — N390 Urinary tract infection, site not specified: Secondary | ICD-10-CM | POA: Diagnosis not present

## 2016-01-13 DIAGNOSIS — M810 Age-related osteoporosis without current pathological fracture: Secondary | ICD-10-CM | POA: Diagnosis not present

## 2016-01-13 DIAGNOSIS — E119 Type 2 diabetes mellitus without complications: Secondary | ICD-10-CM | POA: Diagnosis not present

## 2016-01-14 DIAGNOSIS — H53483 Generalized contraction of visual field, bilateral: Secondary | ICD-10-CM | POA: Diagnosis not present

## 2016-01-14 DIAGNOSIS — H3552 Pigmentary retinal dystrophy: Secondary | ICD-10-CM | POA: Diagnosis not present

## 2016-01-20 DIAGNOSIS — Z0000 Encounter for general adult medical examination without abnormal findings: Secondary | ICD-10-CM | POA: Diagnosis not present

## 2016-01-20 DIAGNOSIS — I509 Heart failure, unspecified: Secondary | ICD-10-CM | POA: Diagnosis not present

## 2016-01-20 DIAGNOSIS — D692 Other nonthrombocytopenic purpura: Secondary | ICD-10-CM | POA: Diagnosis not present

## 2016-01-20 DIAGNOSIS — E119 Type 2 diabetes mellitus without complications: Secondary | ICD-10-CM | POA: Diagnosis not present

## 2016-01-20 DIAGNOSIS — I110 Hypertensive heart disease with heart failure: Secondary | ICD-10-CM | POA: Diagnosis not present

## 2016-01-20 DIAGNOSIS — Z6828 Body mass index (BMI) 28.0-28.9, adult: Secondary | ICD-10-CM | POA: Diagnosis not present

## 2016-01-20 DIAGNOSIS — I5189 Other ill-defined heart diseases: Secondary | ICD-10-CM | POA: Diagnosis not present

## 2016-01-20 DIAGNOSIS — E7800 Pure hypercholesterolemia, unspecified: Secondary | ICD-10-CM | POA: Diagnosis not present

## 2016-01-20 DIAGNOSIS — C50919 Malignant neoplasm of unspecified site of unspecified female breast: Secondary | ICD-10-CM | POA: Diagnosis not present

## 2016-01-20 DIAGNOSIS — H3552 Pigmentary retinal dystrophy: Secondary | ICD-10-CM | POA: Diagnosis not present

## 2016-01-21 DIAGNOSIS — H53483 Generalized contraction of visual field, bilateral: Secondary | ICD-10-CM | POA: Diagnosis not present

## 2016-01-21 DIAGNOSIS — H3552 Pigmentary retinal dystrophy: Secondary | ICD-10-CM | POA: Diagnosis not present

## 2016-01-25 ENCOUNTER — Other Ambulatory Visit: Payer: Self-pay | Admitting: Internal Medicine

## 2016-02-11 ENCOUNTER — Other Ambulatory Visit: Payer: Self-pay | Admitting: Nurse Practitioner

## 2016-02-17 DIAGNOSIS — C44619 Basal cell carcinoma of skin of left upper limb, including shoulder: Secondary | ICD-10-CM | POA: Diagnosis not present

## 2016-02-17 DIAGNOSIS — L821 Other seborrheic keratosis: Secondary | ICD-10-CM | POA: Diagnosis not present

## 2016-02-17 DIAGNOSIS — L570 Actinic keratosis: Secondary | ICD-10-CM | POA: Diagnosis not present

## 2016-02-17 DIAGNOSIS — C44519 Basal cell carcinoma of skin of other part of trunk: Secondary | ICD-10-CM | POA: Diagnosis not present

## 2016-02-17 DIAGNOSIS — D485 Neoplasm of uncertain behavior of skin: Secondary | ICD-10-CM | POA: Diagnosis not present

## 2016-02-17 DIAGNOSIS — D1801 Hemangioma of skin and subcutaneous tissue: Secondary | ICD-10-CM | POA: Diagnosis not present

## 2016-02-17 DIAGNOSIS — Z85828 Personal history of other malignant neoplasm of skin: Secondary | ICD-10-CM | POA: Diagnosis not present

## 2016-03-03 ENCOUNTER — Encounter (HOSPITAL_COMMUNITY): Payer: Medicare Other | Admitting: Internal Medicine

## 2016-04-01 ENCOUNTER — Encounter (HOSPITAL_COMMUNITY): Payer: Self-pay | Admitting: Internal Medicine

## 2016-04-01 ENCOUNTER — Ambulatory Visit (HOSPITAL_COMMUNITY)
Admission: RE | Admit: 2016-04-01 | Discharge: 2016-04-01 | Disposition: A | Discharge status: Home / Self Care | Payer: Medicare Other | Source: Ambulatory Visit | Attending: Internal Medicine | Admitting: Internal Medicine

## 2016-04-01 VITALS — BP 136/70 | HR 76 | Wt 174.1 lb

## 2016-04-01 DIAGNOSIS — Z9221 Personal history of antineoplastic chemotherapy: Secondary | ICD-10-CM | POA: Insufficient documentation

## 2016-04-01 DIAGNOSIS — Z823 Family history of stroke: Secondary | ICD-10-CM | POA: Insufficient documentation

## 2016-04-01 DIAGNOSIS — Z888 Allergy status to other drugs, medicaments and biological substances status: Secondary | ICD-10-CM | POA: Diagnosis not present

## 2016-04-01 DIAGNOSIS — E119 Type 2 diabetes mellitus without complications: Secondary | ICD-10-CM | POA: Diagnosis not present

## 2016-04-01 DIAGNOSIS — Z87891 Personal history of nicotine dependence: Secondary | ICD-10-CM | POA: Insufficient documentation

## 2016-04-01 DIAGNOSIS — Z602 Problems related to living alone: Secondary | ICD-10-CM | POA: Diagnosis not present

## 2016-04-01 DIAGNOSIS — Z79899 Other long term (current) drug therapy: Secondary | ICD-10-CM | POA: Insufficient documentation

## 2016-04-01 DIAGNOSIS — C50911 Malignant neoplasm of unspecified site of right female breast: Secondary | ICD-10-CM | POA: Insufficient documentation

## 2016-04-01 DIAGNOSIS — H3552 Pigmentary retinal dystrophy: Secondary | ICD-10-CM | POA: Diagnosis not present

## 2016-04-01 DIAGNOSIS — I10 Essential (primary) hypertension: Secondary | ICD-10-CM | POA: Diagnosis not present

## 2016-04-01 NOTE — Patient Instructions (Signed)
STOP taking Coreg  Follow up as needed.

## 2016-04-01 NOTE — Addendum Note (Signed)
Encounter addended by: Kennieth Rad, RN on: 04/01/2016  3:42 PM<BR>    Actions taken: Order list changed, Medication long-term status modified, Sign clinical note

## 2016-04-01 NOTE — Progress Notes (Signed)
Patient ID: Angela Rosario, female   DOB: 1940-05-07, 76 y.o.   MRN: [id number] Oncologist: Dr Angela Rosario PCP: Dr Angela Rosario  HPI: Ms Angela Rosario is a 76 y.o.with a history of stage I R breast cancer, invasive ductal carcinoma of the right breast HER-2/neu positive with Ki-67 of 45%.   She also has a history S/P R lumpectomy, sinus tach on carvedilol, DM, retinitis pigmentosa, and HTN with no known coronary disease referred to cardio-oncology clinic by Dr Angela Rosario.  She was started on adjuvant chemotherapy consisting of [place] (Taxotere/Carboplatin/Herceptin). Followed by radiation. Followed by herceptin for 1 year. Completed Herceptin in 2015 with one scheduled interruption for mildly reduced EF. Started on losartan and carvedilol at that point.   She comes today because the pharmacy reported that she was only taking carvedilol once a day instead of bid. Feels great. Walking 69mle/day. No SOB/CP.  Says SBP usually in the 120s. No edema or HF symptoms.   ECHO 09/2012 EF 60% lateral S 10.4 GLS -23.7% ECHO 02/13/13 EF 60-65% lateral S' 10.6 GLS - 19.8% ECHO 4/15 EF 55% (clearly less vigorous), lateral s' 9.7 cm/sec, GLS -16.8% =>Herceptin held ECHO 5/15 EF 60%, lateral S' 11.7, GLS -18.8 ECHO 08/14/13 EF 60% Lateral S' 11.3 GLS -20 ECHO 10/15 EF 55-60%, mild FBSH, normal RV size and systolic function, lateral S' 9.8, GLS -18.8%.   Labs (4/15): K 4.7, creatinine 0.7 Labs 07/31/13 : K 4.4 Creatinine 0.8 Labs (9/15): K 4.2, creatinine 0.8  FH: GM had CVA Father parkinson  SH: Lives alone. 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   Review of Systems: All systems reviewed and negative except as per HPI.   Past Medical History:  Diagnosis Date  . AC (acromioclavicular) joint bone spurs    bone spurs in neck  . Arthritis    oa  . [place]   . Carcinoma of breast [place] with adjuvant chemotherapy ([place]     Three weeks of Herceptin therapy that started on 11/19/2012 and  adjuvant chemotherapy consisting of T[place]started on 12/10/2012. She completed therapy TBrittontherapy on 02/18/13 and began adjuvant every 3 week Herceptin on 02/25/13.   . Diabetes mellitus    niddm; last A1C 6.2  . Diastolic dysfunction   . Episode of dizziness    Mild episodes  . Hyperlipidemia   . Hypertension   . Orthostatic hypotension    Some episodes  . 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 S/P radiation therapy  04/01/2013-05/14/2013   1) Right breast / 50 Gy in 25 fractions, 2) Right breast boost / 10 Gy in 5 fractions  . Skin cancer    multiple sites (neck, nose, etc.)  . Stress incontinence, female    wears pads  . Syncope   . Wears glasses     Current Outpatient Prescriptions  Medication Sig Dispense Refill  . carvedilol (COREG) 6.25 MG tablet TAKE 1 TABLET TWICE DAILY WITH A MEAL. 180 tablet 3  . glimepiride (AMARYL) 1 MG tablet Take 1 mg by mouth daily with breakfast.    . letrozole (FEMARA) 2.5 MG tablet Take 1 tablet (2.5 mg total) by mouth daily. 90 tablet 4  . losartan (COZAAR) 25 MG tablet TAKE 1 TABLET ONCE DAILY. 90 tablet 0  . lovastatin (MEVACOR) 10 MG tablet Take 10 mg by mouth at bedtime.     . Multiple Vitamin (MULTIVITAMIN) capsule Take 1 capsule by mouth daily.  No current facility-administered medications for this encounter.    Facility-Administered Medications Ordered in Other Encounters  Medication Dose Route Frequency Provider Last Rate Last Dose  . gentamicin (GARAMYCIN) 160 mg in dextrose 5 % 50 mL IVPB  160 mg Intravenous 30 min Pre-Op Franchot Gallo, MD         Allergies  Allergen Reactions  . Oxycodone Other (See Comments)    hallucinations    Social History   Social History  . Marital status: Single    Spouse name: N/A  . Number of children: N/A  . Years of education: N/A   Occupational History  . Not on file.   Social History Main Topics  . Smoking status: Former Smoker    Packs/day: 0.25    Years:  1.00    Quit date: 02/14/1957  . Smokeless tobacco: Never Used  . Alcohol use No  . Drug use: No  . Sexual activity: Not Currently    Birth control/ protection: Surgical   Other Topics Concern  . Not on file   Social History Narrative   Divorced, lives alone with her poodle   PHYSICAL EXAM: Vitals:   04/01/16 1505  BP: 136/70  Pulse: 76   General:  Well appearing. No respiratory difficulty HEENT: normal Neck: supple. no JVD. Carotids 2+ bilat; no bruits. No lymphadenopathy or thryomegaly appreciated. Cor: PMI nondisplaced. Regular rate & rhythm. No rubs, gallops or murmurs. Lungs: clear Abdomen: soft, nontender, nondistended. No hepatosplenomegaly. No bruits or masses. Good bowel sounds. Extremities: no cyanosis, clubbing, rash. No ankle edema.  Neuro: alert & oriented x 3, cranial nerves grossly intact. moves all 4 extremities w/o difficulty. Affect pleasant.   ASSESSMENT & PLAN: 1. R Breast Cancer: HER-2/neu positive. She has completed Herceptin.   2. HTN: BP looks great even on carvedilol once per day. I do not favor carvedilol as a first line BP agent. We were using this just during her herceptin therapy for cardio-protection. We will stop this. If BP goes up can increase losartan as needed 3. DM2: followed by PCP. Last hgbA1c = 6.4. Continue ARB and statin  BensimhonQuillian Quince 04/01/2016

## 2016-04-28 ENCOUNTER — Other Ambulatory Visit (HOSPITAL_COMMUNITY): Payer: Self-pay | Admitting: Internal Medicine

## 2016-05-11 DIAGNOSIS — C44519 Basal cell carcinoma of skin of other part of trunk: Secondary | ICD-10-CM | POA: Diagnosis not present

## 2016-05-11 DIAGNOSIS — D2261 Melanocytic nevi of right upper limb, including shoulder: Secondary | ICD-10-CM | POA: Diagnosis not present

## 2016-05-11 DIAGNOSIS — L821 Other seborrheic keratosis: Secondary | ICD-10-CM | POA: Diagnosis not present

## 2016-05-11 DIAGNOSIS — Z85828 Personal history of other malignant neoplasm of skin: Secondary | ICD-10-CM | POA: Diagnosis not present

## 2016-05-11 DIAGNOSIS — D1801 Hemangioma of skin and subcutaneous tissue: Secondary | ICD-10-CM | POA: Diagnosis not present

## 2016-07-14 DIAGNOSIS — Z85828 Personal history of other malignant neoplasm of skin: Secondary | ICD-10-CM | POA: Diagnosis not present

## 2016-07-14 DIAGNOSIS — L570 Actinic keratosis: Secondary | ICD-10-CM | POA: Diagnosis not present

## 2016-07-22 DIAGNOSIS — I110 Hypertensive heart disease with heart failure: Secondary | ICD-10-CM | POA: Diagnosis not present

## 2016-07-22 DIAGNOSIS — I509 Heart failure, unspecified: Secondary | ICD-10-CM | POA: Diagnosis not present

## 2016-07-22 DIAGNOSIS — E1169 Type 2 diabetes mellitus with other specified complication: Secondary | ICD-10-CM | POA: Diagnosis not present

## 2016-07-22 DIAGNOSIS — I5189 Other ill-defined heart diseases: Secondary | ICD-10-CM | POA: Diagnosis not present

## 2016-08-03 DIAGNOSIS — H3552 Pigmentary retinal dystrophy: Secondary | ICD-10-CM | POA: Diagnosis not present

## 2016-08-03 DIAGNOSIS — H35372 Puckering of macula, left eye: Secondary | ICD-10-CM | POA: Diagnosis not present

## 2016-08-03 DIAGNOSIS — H2513 Age-related nuclear cataract, bilateral: Secondary | ICD-10-CM | POA: Diagnosis not present

## 2016-08-03 DIAGNOSIS — H43811 Vitreous degeneration, right eye: Secondary | ICD-10-CM | POA: Diagnosis not present

## 2016-08-07 ENCOUNTER — Telehealth: Payer: Self-pay | Admitting: Hematology and Oncology

## 2016-08-07 NOTE — Telephone Encounter (Signed)
Lvm advising appt chgd from 7/11 to 7/16 @ 11.45am.

## 2016-08-16 DIAGNOSIS — C44219 Basal cell carcinoma of skin of left ear and external auricular canal: Secondary | ICD-10-CM | POA: Diagnosis not present

## 2016-08-16 DIAGNOSIS — Z85828 Personal history of other malignant neoplasm of skin: Secondary | ICD-10-CM | POA: Diagnosis not present

## 2016-08-16 DIAGNOSIS — C44311 Basal cell carcinoma of skin of nose: Secondary | ICD-10-CM | POA: Diagnosis not present

## 2016-08-22 DIAGNOSIS — H2513 Age-related nuclear cataract, bilateral: Secondary | ICD-10-CM | POA: Diagnosis not present

## 2016-08-22 DIAGNOSIS — H25042 Posterior subcapsular polar age-related cataract, left eye: Secondary | ICD-10-CM | POA: Diagnosis not present

## 2016-08-24 ENCOUNTER — Ambulatory Visit: Payer: Medicare Other | Admitting: Hematology and Oncology

## 2016-08-29 ENCOUNTER — Ambulatory Visit: Payer: Medicare Other | Admitting: Hematology and Oncology

## 2016-08-30 ENCOUNTER — Ambulatory Visit (HOSPITAL_BASED_OUTPATIENT_CLINIC_OR_DEPARTMENT_OTHER): Payer: Medicare Other | Admitting: Hematology and Oncology

## 2016-08-30 ENCOUNTER — Encounter: Payer: Self-pay | Admitting: Hematology and Oncology

## 2016-08-30 DIAGNOSIS — Z170 Estrogen receptor positive status [ER+]: Secondary | ICD-10-CM

## 2016-08-30 DIAGNOSIS — Z79811 Long term (current) use of aromatase inhibitors: Secondary | ICD-10-CM | POA: Diagnosis not present

## 2016-08-30 DIAGNOSIS — C50111 Malignant neoplasm of central portion of right female breast: Secondary | ICD-10-CM

## 2016-08-30 MED ORDER — LETROZOLE 2.5 MG PO TABS: 2.5000 mg | ORAL_TABLET | Freq: Every day | ORAL | 3 refills | Status: DC

## 2016-08-30 NOTE — Assessment & Plan Note (Signed)
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 Status post lumpectomy 10/04/2012 1.6 cm 0/5 lymph nodes T1 cN0 M0 stage IA status post adjuvant chemotherapy with [place] followed by Herceptin maintenance completed 10/23/2013, adjuvant radiation therapy was also complete and is now on adjuvant antiestrogen therapy with Femara started April 2015.  Aromatase inhibitor toxicities: vaginal dryness.  Surveillance: 1. Breast exam 08/30/2016 benign 2. Mammogram 11/26/2014 benign. 3. Bone density April 2015: normal T score +3.6 in the spine  Chest discomfort: CT chest 03/13/2015: No evidence of metastatic disease, gastric fundus varices could be due to portal hypertension, partial visualization of chronic intrahepatic and extrahepatic biliary dilatation prior cholecystectomy  Return to clinic in 1 year

## 2016-08-30 NOTE — Progress Notes (Addendum)
Patient Care Team: Tisovec, Fransico Him, MD as PCP - General (Internal Medicine)  DIAGNOSIS:  Encounter Diagnosis  Name Primary?  . Malignant neoplasm of central portion of right breast in female, estrogen receptor positive ([place])     SUMMARY OF ONCOLOGIC HISTORY:   Cancer of central portion of right female breast ([place])   09/11/2012 Mammogram    Right breast mass 7 mm at 12:00 position      09/12/2012 Initial Biopsy    Invasive mammary cancer with lobular features grade 2 ER 100%, PR percent, TSH some 45%, HER-2 positive ratio 2.96      09/18/2012 Breast MRI    Right breast mass 1.5 x 1.7 x 1.5 cm, no abnormal lymph nodes clinical stage TI cN0 M0 stage IA      10/04/2012 Surgery    Right breast lumpectomy 1.6 cm IDC grade 2 with associated intermediate grade DCIS tumor involving the dermis and lymphovascular invasion ER 100%, PR 100%, just some 45%, HER-2 positive, 0/5 lymph nodes      11/19/2012 - 10/23/2013 Chemotherapy    Adjuvant chemotherapy: [place] complicated by a fall 56/38/7564 fracture required surgery) resume chemotherapy 12/10/2012 completed [facility] 02/18/2013, Herceptin maintenance completed 10/23/2013      04/01/2013 - 05/14/2013 Radiation Therapy    Adjuvant radiation therapy by Dr. Isidore Moos      05/28/2013 -  Anti-estrogen oral therapy    Letrozole 2.5 mg daily       CHIEF COMPLIANT: Follow-up on tamoxifen therapy  INTERVAL HISTORY: JERALD VILLALONA is a 76 year old with above-mentioned history of right breast cancer underwent lumpectomy and adjuvant chemotherapy and is currently on letrozole therapy. She has no side effects from letrozole. She denies any hot flashes or myalgias. She is getting surgery on her nose in the ER for skin cancer. She is also planning to get cataracts surgeries at the end of August this year. Denies any lumps or nodules in the breasts other than the nodularity at the site of surgery and radiation on the right breast.  REVIEW OF SYSTEMS:     Constitutional: Denies fevers, chills or abnormal weight loss Eyes: Denies blurriness of vision Ears, nose, mouth, throat, and face: Denies mucositis or sore throat Respiratory: Denies cough, dyspnea or wheezes Cardiovascular: Denies palpitation, chest discomfort Gastrointestinal:  Denies nausea, heartburn or change in bowel habits Skin: Skin cancers and ear and nose Lymphatics: Denies new lymphadenopathy or easy bruising Neurological:Denies numbness, tingling or new weaknesses Behavioral/Psych: Mood is stable, no new changes  Extremities: No lower extremity edema Breast: Nodularity on the surgical site right breast All other systems were reviewed with the patient and are negative.  I have reviewed the past medical history, past surgical history, social history and family history with the patient and they are unchanged from previous note.  ALLERGIES:  is allergic to oxycodone.  MEDICATIONS:  Current Outpatient Prescriptions  Medication Sig Dispense Refill  . glimepiride (AMARYL) 1 MG tablet Take 1 mg by mouth daily with breakfast.    . letrozole (FEMARA) 2.5 MG tablet Take 1 tablet (2.5 mg total) by mouth daily. 90 tablet 3  . losartan (COZAAR) 25 MG tablet Take 1 tablet (25 mg total) by mouth daily. 90 tablet 3  . lovastatin (MEVACOR) 10 MG tablet Take 10 mg by mouth at bedtime.     . Multiple Vitamin (MULTIVITAMIN) capsule Take 1 capsule by mouth daily.     No current facility-administered medications for this visit.    Facility-Administered Medications Ordered in  Other Visits  Medication Dose Route Frequency Provider Last Rate Last Dose  . gentamicin (GARAMYCIN) 160 mg in dextrose 5 % 50 mL IVPB  160 mg Intravenous 30 min Pre-Op Franchot Gallo, MD        PHYSICAL EXAMINATION: ECOG PERFORMANCE STATUS: 1 - Symptomatic but completely ambulatory  Vitals:   08/30/16 0915  BP: 137/68  Pulse: 70  Resp: 20  Temp: 98.2 F (36.8 C)   Filed Weights   08/30/16 0915   Weight: 183 lb (83 kg)    GENERAL:alert, no distress and comfortable SKIN: skin color, texture, turgor are normal, no rashes or significant lesions EYES: normal, Conjunctiva are pink and non-injected, sclera clear OROPHARYNX:no exudate, no erythema and lips, buccal mucosa, and tongue normal  NECK: supple, thyroid normal size, non-tender, without nodularity LYMPH:  no palpable lymphadenopathy in the cervical, axillary or inguinal LUNGS: clear to auscultation and percussion with normal breathing effort HEART: regular rate & rhythm and no murmurs and no lower extremity edema ABDOMEN:abdomen soft, non-tender and normal bowel sounds MUSCULOSKELETAL:no cyanosis of digits and no clubbing  NEURO: alert & oriented x 3 with fluent speech, no focal motor/sensory deficits EXTREMITIES: No lower extremity edema BREAST: Postsurgical changes on palpation of the right breast. No palpable axillary supraclavicular or infraclavicular adenopathy no breast tenderness or nipple discharge. (exam performed in the presence of a chaperone)  LABORATORY DATA:  I have reviewed the data as listed   Chemistry      Component Value Date/Time   NA 141 02/27/2015 0858   K 4.4 02/27/2015 0858   CL 96 11/01/2012 1350   CO2 26 02/27/2015 0858   BUN 15.4 02/27/2015 0858   CREATININE 0.9 02/27/2015 0858      Component Value Date/Time   CALCIUM 9.4 02/27/2015 0858   ALKPHOS 89 02/27/2015 0858   AST 14 02/27/2015 0858   ALT 16 02/27/2015 0858   BILITOT 0.91 02/27/2015 0858       Lab Results  Component Value Date   WBC 7.2 08/05/2014   HGB 14.1 08/05/2014   HCT 41.1 08/05/2014   MCV 90.9 08/05/2014   PLT 252 08/05/2014   NEUTROABS 4.7 08/05/2014    ASSESSMENT & PLAN:  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 Status post lumpectomy 10/04/2012 1.6  cm 0/5 lymph nodes T1 cN0 M0 stage IA status post adjuvant chemotherapy with [place] followed by Herceptin maintenance completed 10/23/2013, adjuvant radiation therapy was also complete and is now on adjuvant antiestrogen therapy with Femara started April 2015.  Aromatase inhibitor toxicities: vaginal dryness.  Surveillance: 1. Breast exam 08/30/2016 benign 2. Mammogram October 2017 at [facility] benign. 3. Bone density April 2015: normal T score +3.6 in the spine  Chest discomfort: CT chest 03/13/2015: No evidence of metastatic disease, gastric fundus varices could be due to portal hypertension, partial visualization of chronic intrahepatic and extrahepatic biliary dilatation prior cholecystectomy Skin cancers: Lonnie to get surgery Return to clinic in 1 year  I spent 15 minutes talking to the patient of which more than half was spent in counseling and coordination of care.  No orders of the defined types were placed in this encounter.  The patient has a good understanding of the overall plan. she agrees with it. she will call with any problems that may develop before the next visit here.   Rulon Eisenmenger,  MD 08/30/16   Addendum:  Bone density 12/03/2015: T score -0.8: Normal Mammogram 12/03/2015 at [facility]: 1.5 cm oval fat-containing mass consistent with oil cyst and is benign

## 2016-09-06 DIAGNOSIS — C44219 Basal cell carcinoma of skin of left ear and external auricular canal: Secondary | ICD-10-CM | POA: Diagnosis not present

## 2016-09-06 DIAGNOSIS — Z85828 Personal history of other malignant neoplasm of skin: Secondary | ICD-10-CM | POA: Diagnosis not present

## 2016-09-13 DIAGNOSIS — Z85828 Personal history of other malignant neoplasm of skin: Secondary | ICD-10-CM | POA: Diagnosis not present

## 2016-09-13 DIAGNOSIS — C44311 Basal cell carcinoma of skin of nose: Secondary | ICD-10-CM | POA: Diagnosis not present

## 2016-10-11 DIAGNOSIS — H25042 Posterior subcapsular polar age-related cataract, left eye: Secondary | ICD-10-CM | POA: Diagnosis not present

## 2016-10-11 DIAGNOSIS — H2512 Age-related nuclear cataract, left eye: Secondary | ICD-10-CM | POA: Diagnosis not present

## 2016-10-11 DIAGNOSIS — H21562 Pupillary abnormality, left eye: Secondary | ICD-10-CM | POA: Diagnosis not present

## 2016-10-11 DIAGNOSIS — H25812 Combined forms of age-related cataract, left eye: Secondary | ICD-10-CM | POA: Diagnosis not present

## 2016-11-09 DIAGNOSIS — E119 Type 2 diabetes mellitus without complications: Secondary | ICD-10-CM | POA: Diagnosis not present

## 2016-12-07 DIAGNOSIS — H35353 Cystoid macular degeneration, bilateral: Secondary | ICD-10-CM | POA: Diagnosis not present

## 2016-12-08 DIAGNOSIS — R928 Other abnormal and inconclusive findings on diagnostic imaging of breast: Secondary | ICD-10-CM | POA: Diagnosis not present

## 2016-12-08 DIAGNOSIS — Z853 Personal history of malignant neoplasm of breast: Secondary | ICD-10-CM | POA: Diagnosis not present

## 2016-12-15 DIAGNOSIS — Z23 Encounter for immunization: Secondary | ICD-10-CM | POA: Diagnosis not present

## 2017-01-12 DIAGNOSIS — H35353 Cystoid macular degeneration, bilateral: Secondary | ICD-10-CM | POA: Diagnosis not present

## 2017-01-17 DIAGNOSIS — R82998 Other abnormal findings in urine: Secondary | ICD-10-CM | POA: Diagnosis not present

## 2017-01-17 DIAGNOSIS — E119 Type 2 diabetes mellitus without complications: Secondary | ICD-10-CM | POA: Diagnosis not present

## 2017-01-17 DIAGNOSIS — I10 Essential (primary) hypertension: Secondary | ICD-10-CM | POA: Diagnosis not present

## 2017-01-17 DIAGNOSIS — E7800 Pure hypercholesterolemia, unspecified: Secondary | ICD-10-CM | POA: Diagnosis not present

## 2017-01-24 DIAGNOSIS — I5189 Other ill-defined heart diseases: Secondary | ICD-10-CM | POA: Diagnosis not present

## 2017-01-24 DIAGNOSIS — I110 Hypertensive heart disease with heart failure: Secondary | ICD-10-CM | POA: Diagnosis not present

## 2017-01-24 DIAGNOSIS — I509 Heart failure, unspecified: Secondary | ICD-10-CM | POA: Diagnosis not present

## 2017-01-24 DIAGNOSIS — Z0000 Encounter for general adult medical examination without abnormal findings: Secondary | ICD-10-CM | POA: Diagnosis not present

## 2017-01-27 DIAGNOSIS — Z1212 Encounter for screening for malignant neoplasm of rectum: Secondary | ICD-10-CM | POA: Diagnosis not present

## 2017-03-07 DIAGNOSIS — H2511 Age-related nuclear cataract, right eye: Secondary | ICD-10-CM | POA: Diagnosis not present

## 2017-03-07 DIAGNOSIS — H25811 Combined forms of age-related cataract, right eye: Secondary | ICD-10-CM | POA: Diagnosis not present

## 2017-03-07 DIAGNOSIS — K117 Disturbances of salivary secretion: Secondary | ICD-10-CM | POA: Diagnosis not present

## 2017-03-07 DIAGNOSIS — R252 Cramp and spasm: Secondary | ICD-10-CM | POA: Diagnosis not present

## 2017-03-07 DIAGNOSIS — C73 Malignant neoplasm of thyroid gland: Secondary | ICD-10-CM | POA: Diagnosis not present

## 2017-04-04 DIAGNOSIS — E119 Type 2 diabetes mellitus without complications: Secondary | ICD-10-CM | POA: Diagnosis not present

## 2017-04-20 ENCOUNTER — Other Ambulatory Visit (HOSPITAL_COMMUNITY): Payer: Self-pay | Admitting: Internal Medicine

## 2017-05-01 DIAGNOSIS — H35353 Cystoid macular degeneration, bilateral: Secondary | ICD-10-CM | POA: Diagnosis not present

## 2017-06-07 DIAGNOSIS — H35353 Cystoid macular degeneration, bilateral: Secondary | ICD-10-CM | POA: Diagnosis not present

## 2017-06-28 DIAGNOSIS — E119 Type 2 diabetes mellitus without complications: Secondary | ICD-10-CM | POA: Diagnosis not present

## 2017-07-06 DIAGNOSIS — H35353 Cystoid macular degeneration, bilateral: Secondary | ICD-10-CM | POA: Diagnosis not present

## 2017-07-12 DIAGNOSIS — D485 Neoplasm of uncertain behavior of skin: Secondary | ICD-10-CM | POA: Diagnosis not present

## 2017-07-12 DIAGNOSIS — D0362 Melanoma in situ of left upper limb, including shoulder: Secondary | ICD-10-CM | POA: Diagnosis not present

## 2017-07-12 DIAGNOSIS — L814 Other melanin hyperpigmentation: Secondary | ICD-10-CM | POA: Diagnosis not present

## 2017-07-12 DIAGNOSIS — L570 Actinic keratosis: Secondary | ICD-10-CM | POA: Diagnosis not present

## 2017-07-12 DIAGNOSIS — Z85828 Personal history of other malignant neoplasm of skin: Secondary | ICD-10-CM | POA: Diagnosis not present

## 2017-07-18 ENCOUNTER — Telehealth: Payer: Self-pay | Admitting: Hematology and Oncology

## 2017-07-18 NOTE — Telephone Encounter (Signed)
Called pt and left msg.  Rescheduled appt from 7/23 to 7/30 @ 2:45 pm.  Mailed calendar.

## 2017-07-27 DIAGNOSIS — D0362 Melanoma in situ of left upper limb, including shoulder: Secondary | ICD-10-CM | POA: Diagnosis not present

## 2017-07-27 DIAGNOSIS — Z85828 Personal history of other malignant neoplasm of skin: Secondary | ICD-10-CM | POA: Diagnosis not present

## 2017-08-01 DIAGNOSIS — E7800 Pure hypercholesterolemia, unspecified: Secondary | ICD-10-CM | POA: Diagnosis not present

## 2017-08-01 DIAGNOSIS — I10 Essential (primary) hypertension: Secondary | ICD-10-CM | POA: Diagnosis not present

## 2017-08-01 DIAGNOSIS — H3552 Pigmentary retinal dystrophy: Secondary | ICD-10-CM | POA: Diagnosis not present

## 2017-08-01 DIAGNOSIS — E119 Type 2 diabetes mellitus without complications: Secondary | ICD-10-CM | POA: Diagnosis not present

## 2017-08-02 DIAGNOSIS — H3552 Pigmentary retinal dystrophy: Secondary | ICD-10-CM | POA: Diagnosis not present

## 2017-08-02 DIAGNOSIS — H43811 Vitreous degeneration, right eye: Secondary | ICD-10-CM | POA: Diagnosis not present

## 2017-08-02 DIAGNOSIS — H59033 Cystoid macular edema following cataract surgery, bilateral: Secondary | ICD-10-CM | POA: Diagnosis not present

## 2017-08-02 DIAGNOSIS — H35373 Puckering of macula, bilateral: Secondary | ICD-10-CM | POA: Diagnosis not present

## 2017-08-31 DIAGNOSIS — H35353 Cystoid macular degeneration, bilateral: Secondary | ICD-10-CM | POA: Diagnosis not present

## 2017-09-05 ENCOUNTER — Ambulatory Visit: Payer: Medicare Other | Admitting: Hematology and Oncology

## 2017-09-12 ENCOUNTER — Inpatient Hospital Stay: Payer: Medicare Other | Attending: Hematology and Oncology | Admitting: Hematology and Oncology

## 2017-09-12 ENCOUNTER — Telehealth: Payer: Self-pay | Admitting: Hematology and Oncology

## 2017-09-12 DIAGNOSIS — Z170 Estrogen receptor positive status [ER+]: Secondary | ICD-10-CM | POA: Insufficient documentation

## 2017-09-12 DIAGNOSIS — Z79811 Long term (current) use of aromatase inhibitors: Secondary | ICD-10-CM | POA: Insufficient documentation

## 2017-09-12 DIAGNOSIS — Z9221 Personal history of antineoplastic chemotherapy: Secondary | ICD-10-CM | POA: Insufficient documentation

## 2017-09-12 DIAGNOSIS — C50111 Malignant neoplasm of central portion of right female breast: Secondary | ICD-10-CM | POA: Insufficient documentation

## 2017-09-12 DIAGNOSIS — Z923 Personal history of irradiation: Secondary | ICD-10-CM | POA: Insufficient documentation

## 2017-09-12 MED ORDER — PREDNISOLONE ACETATE 1 % OP SUSP: 1.0000 [drp] | Freq: Two times a day (BID) | OPHTHALMIC | 0 refills | Status: AC

## 2017-09-12 MED ORDER — LETROZOLE 2.5 MG PO TABS: 2.5000 mg | ORAL_TABLET | Freq: Every day | ORAL | 3 refills | Status: DC

## 2017-09-12 NOTE — Assessment & Plan Note (Signed)
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 Status post lumpectomy 10/04/2012 1.6 cm 0/5 lymph nodes T1 cN0 M0 stage IA status post adjuvant chemotherapy with [place] followed by Herceptin maintenance completed 10/23/2013, adjuvant radiation therapy was also complete and is now on adjuvant antiestrogen therapy with Femara started April 2015. Plan treatment duration 7 years  Aromatase inhibitor toxicities: vaginal dryness.  Surveillance: 1. Breast exam  09/12/2017 benign 2. Mammogram  at [facility] benign. 3. Bone density April 2015: normal T score +3.6 in the spine  Return to clinic in 1 year for follow-up

## 2017-09-12 NOTE — Telephone Encounter (Signed)
Gave patient avs and calendar of upcoming appts,.  °

## 2017-09-12 NOTE — Progress Notes (Signed)
Patient Care Team: Tisovec, Fransico Him, MD as PCP - General (Internal Medicine)  DIAGNOSIS:  Encounter Diagnosis  Name Primary?  . Malignant neoplasm of central portion of right breast in female, estrogen receptor positive ([place])     SUMMARY OF ONCOLOGIC HISTORY:   Cancer of central portion of right female breast ([place])   09/11/2012 Mammogram    Right breast mass 7 mm at 12:00 position      09/12/2012 Initial Biopsy    Invasive mammary cancer with lobular features grade 2 ER 100%, PR percent, TSH some 45%, HER-2 positive ratio 2.96      09/18/2012 Breast MRI    Right breast mass 1.5 x 1.7 x 1.5 cm, no abnormal lymph nodes clinical stage TI cN0 M0 stage IA      10/04/2012 Surgery    Right breast lumpectomy 1.6 cm IDC grade 2 with associated intermediate grade DCIS tumor involving the dermis and lymphovascular invasion ER 100%, PR 100%, just some 45%, HER-2 positive, 0/5 lymph nodes      11/19/2012 - 10/23/2013 Chemotherapy    Adjuvant chemotherapy: [place] complicated by a fall 48/54/6270 fracture required surgery) resume chemotherapy 12/10/2012 completed [facility] 02/18/2013, Herceptin maintenance completed 10/23/2013      04/01/2013 - 05/14/2013 Radiation Therapy    Adjuvant radiation therapy by Dr. Isidore Moos      05/28/2013 -  Anti-estrogen oral therapy    Letrozole 2.5 mg daily       CHIEF COMPLIANT: Follow-up on letrozole therapy  INTERVAL HISTORY: Angela Rosario is a 77 year old with above-mentioned history of right breast cancer treated with lumpectomy followed by adjuvant chemotherapy and radiation is currently on letrozole therapy.  She appears to be tolerating letrozole extremely well.  Does have occasional hot flashes and arthralgias myalgias.  Denies any lumps or nodules in the breast.  REVIEW OF SYSTEMS:   Constitutional: Denies fevers, chills or abnormal weight loss Eyes: Denies blurriness of vision Ears, nose, mouth, throat, and face: Denies mucositis or sore  throat Respiratory: Denies cough, dyspnea or wheezes Cardiovascular: Denies palpitation, chest discomfort Gastrointestinal:  Denies nausea, heartburn or change in bowel habits Skin: Denies abnormal skin rashes Lymphatics: Denies new lymphadenopathy or easy bruising Neurological:Denies numbness, tingling or new weaknesses Behavioral/Psych: Mood is stable, no new changes  Extremities: No lower extremity edema Breast:  denies any pain or lumps or nodules in either breasts All other systems were reviewed with the patient and are negative.  I have reviewed the past medical history, past surgical history, social history and family history with the patient and they are unchanged from previous note.  ALLERGIES:  is allergic to oxycodone.  MEDICATIONS:  Current Outpatient Medications  Medication Sig Dispense Refill  . letrozole (FEMARA) 2.5 MG tablet Take 1 tablet (2.5 mg total) by mouth daily. 90 tablet 3  . losartan (COZAAR) 25 MG tablet TAKE 1 TABLET ONCE DAILY. 90 tablet 0  . lovastatin (MEVACOR) 10 MG tablet Take 10 mg by mouth at bedtime.     . Multiple Vitamin (MULTIVITAMIN) capsule Take 1 capsule by mouth daily.    . prednisoLONE acetate (PRED FORTE) 1 % ophthalmic suspension Place 1 drop into both eyes 2 (two) times daily. 5 mL 0   No current facility-administered medications for this visit.    Facility-Administered Medications Ordered in Other Visits  Medication Dose Route Frequency Provider Last Rate Last Dose  . gentamicin (GARAMYCIN) 160 mg in dextrose 5 % 50 mL IVPB  160 mg Intravenous 30 min Pre-Op  Franchot Gallo, MD        PHYSICAL EXAMINATION: ECOG PERFORMANCE STATUS: 1 - Symptomatic but completely ambulatory  Vitals:   09/12/17 1511  BP: 137/82  Pulse: 72  Resp: 18  SpO2: 98%   Filed Weights   09/12/17 1511  Weight: 171 lb 6.4 oz (77.7 kg)    GENERAL:alert, no distress and comfortable SKIN: skin color, texture, turgor are normal, no rashes or significant  lesions EYES: normal, Conjunctiva are pink and non-injected, sclera clear OROPHARYNX:no exudate, no erythema and lips, buccal mucosa, and tongue normal  NECK: supple, thyroid normal size, non-tender, without nodularity LYMPH:  no palpable lymphadenopathy in the cervical, axillary or inguinal LUNGS: clear to auscultation and percussion with normal breathing effort HEART: regular rate & rhythm and no murmurs and no lower extremity edema ABDOMEN:abdomen soft, non-tender and normal bowel sounds MUSCULOSKELETAL:no cyanosis of digits and no clubbing  NEURO: alert & oriented x 3 with fluent speech, no focal motor/sensory deficits EXTREMITIES: No lower extremity edema BREAST: No palpable masses or nodules in either right or left breasts. No palpable axillary supraclavicular or infraclavicular adenopathy no breast tenderness or nipple discharge. (exam performed in the presence of a chaperone)  LABORATORY DATA:  I have reviewed the data as listed CMP Latest Ref Rng & Units 02/27/2015 08/05/2014 01/27/2014  Glucose 70 - 140 mg/dl 203(H) 138 270(H)  BUN 7.0 - 26.0 mg/dL 15.4 17.2 13.7  Creatinine 0.6 - 1.1 mg/dL 0.9 0.8 0.9  Sodium 136 - 145 mEq/L 141 143 139  Potassium 3.5 - 5.1 mEq/L 4.4 4.2 4.1  Chloride 96 - 112 mEq/L - - -  CO2 22 - 29 mEq/L _0 Calcium 8.4 - 10.4 mg/dL 9.4 9.8 9.5  Total Protein 6.4 - 8.3 g/dL 6.5 6.4 6.0(L)  Total Bilirubin 0.20 - 1.20 mg/dL 0.91 0.94 0.43  Alkaline Phos 40 - 150 U/L 89 83 91  AST 5 - 34 U/L _1 ALT 0 - 55 U/L _2 Lab Results  Component Value Date   WBC 7.2 08/05/2014   HGB 14.1 08/05/2014   HCT 41.1 08/05/2014   MCV 90.9 08/05/2014   PLT 252 08/05/2014   NEUTROABS 4.7 08/05/2014    ASSESSMENT & PLAN:  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 Status post lumpectomy  10/04/2012 1.6 cm 0/5 lymph nodes T1 cN0 M0 stage IA status post adjuvant chemotherapy with [place] followed by Herceptin maintenance completed 10/23/2013, adjuvant radiation therapy was also complete and is now on adjuvant antiestrogen therapy with Femara started April 2015. Plan treatment duration 5 years  Aromatase inhibitor toxicities: vaginal dryness.  Surveillance: 1. Breast exam  09/12/2017 benign 2. Mammogram  at [facility] benign. 3. Bone density April 2015: normal T score +3.6 in the spine  Return to clinic in 1 year for follow-up    No orders of the defined types were placed in this encounter.  The patient has a good understanding of the overall plan. she agrees with it. she will call with any problems that may develop before the next visit here.   Harriette Ohara, MD 09/12/17

## 2017-09-19 DIAGNOSIS — M21962 Unspecified acquired deformity of left lower leg: Secondary | ICD-10-CM | POA: Diagnosis not present

## 2017-09-19 DIAGNOSIS — M21961 Unspecified acquired deformity of right lower leg: Secondary | ICD-10-CM | POA: Diagnosis not present

## 2017-09-19 DIAGNOSIS — E1351 Other specified diabetes mellitus with diabetic peripheral angiopathy without gangrene: Secondary | ICD-10-CM | POA: Diagnosis not present

## 2017-09-19 DIAGNOSIS — L602 Onychogryphosis: Secondary | ICD-10-CM | POA: Diagnosis not present

## 2017-09-27 DIAGNOSIS — E119 Type 2 diabetes mellitus without complications: Secondary | ICD-10-CM | POA: Diagnosis not present

## 2017-09-28 DIAGNOSIS — Z85828 Personal history of other malignant neoplasm of skin: Secondary | ICD-10-CM | POA: Diagnosis not present

## 2017-09-28 DIAGNOSIS — D0362 Melanoma in situ of left upper limb, including shoulder: Secondary | ICD-10-CM | POA: Diagnosis not present

## 2017-10-10 ENCOUNTER — Other Ambulatory Visit: Payer: Self-pay | Admitting: Hematology and Oncology

## 2017-10-10 DIAGNOSIS — C50111 Malignant neoplasm of central portion of right female breast: Secondary | ICD-10-CM

## 2017-10-10 DIAGNOSIS — Z170 Estrogen receptor positive status [ER+]: Principal | ICD-10-CM

## 2017-10-11 DIAGNOSIS — L84 Corns and callosities: Secondary | ICD-10-CM | POA: Diagnosis not present

## 2017-10-11 DIAGNOSIS — E1351 Other specified diabetes mellitus with diabetic peripheral angiopathy without gangrene: Secondary | ICD-10-CM | POA: Diagnosis not present

## 2017-10-11 DIAGNOSIS — L602 Onychogryphosis: Secondary | ICD-10-CM | POA: Diagnosis not present

## 2017-12-12 DIAGNOSIS — Z853 Personal history of malignant neoplasm of breast: Secondary | ICD-10-CM | POA: Diagnosis not present

## 2017-12-12 DIAGNOSIS — Z23 Encounter for immunization: Secondary | ICD-10-CM | POA: Diagnosis not present

## 2017-12-28 DIAGNOSIS — H02403 Unspecified ptosis of bilateral eyelids: Secondary | ICD-10-CM | POA: Diagnosis not present

## 2017-12-28 DIAGNOSIS — E119 Type 2 diabetes mellitus without complications: Secondary | ICD-10-CM | POA: Diagnosis not present

## 2017-12-28 DIAGNOSIS — H35353 Cystoid macular degeneration, bilateral: Secondary | ICD-10-CM | POA: Diagnosis not present

## 2018-01-01 ENCOUNTER — Telehealth (HOSPITAL_COMMUNITY): Payer: Self-pay | Admitting: Surgery

## 2018-01-01 ENCOUNTER — Other Ambulatory Visit: Payer: Self-pay | Admitting: Hematology and Oncology

## 2018-01-01 DIAGNOSIS — E119 Type 2 diabetes mellitus without complications: Secondary | ICD-10-CM | POA: Diagnosis not present

## 2018-01-01 DIAGNOSIS — R55 Syncope and collapse: Secondary | ICD-10-CM | POA: Diagnosis not present

## 2018-01-01 DIAGNOSIS — Z170 Estrogen receptor positive status [ER+]: Principal | ICD-10-CM

## 2018-01-01 DIAGNOSIS — C50111 Malignant neoplasm of central portion of right female breast: Secondary | ICD-10-CM

## 2018-01-01 DIAGNOSIS — I110 Hypertensive heart disease with heart failure: Secondary | ICD-10-CM | POA: Diagnosis not present

## 2018-01-01 DIAGNOSIS — I951 Orthostatic hypotension: Secondary | ICD-10-CM | POA: Diagnosis not present

## 2018-01-01 NOTE — Telephone Encounter (Signed)
Received an order from [facility] to schedule a Carotid Ultrasound for syncope.   A voicemail was left at [phone] asking the patient to call Rip Harbour at [facility] to arrange an appointment.

## 2018-01-02 ENCOUNTER — Other Ambulatory Visit (HOSPITAL_COMMUNITY): Payer: Self-pay | Admitting: Internal Medicine

## 2018-01-02 DIAGNOSIS — R55 Syncope and collapse: Secondary | ICD-10-CM

## 2018-01-03 ENCOUNTER — Ambulatory Visit (HOSPITAL_COMMUNITY)
Admission: RE | Admit: 2018-01-03 | Discharge: 2018-01-03 | Disposition: A | Discharge status: Home / Self Care | Payer: Medicare Other | Source: Ambulatory Visit | Attending: Family | Admitting: Family

## 2018-01-03 DIAGNOSIS — L602 Onychogryphosis: Secondary | ICD-10-CM | POA: Diagnosis not present

## 2018-01-03 DIAGNOSIS — R55 Syncope and collapse: Secondary | ICD-10-CM

## 2018-01-03 DIAGNOSIS — L84 Corns and callosities: Secondary | ICD-10-CM | POA: Diagnosis not present

## 2018-01-03 DIAGNOSIS — E1351 Other specified diabetes mellitus with diabetic peripheral angiopathy without gangrene: Secondary | ICD-10-CM | POA: Diagnosis not present

## 2018-01-15 DIAGNOSIS — R42 Dizziness and giddiness: Secondary | ICD-10-CM | POA: Diagnosis not present

## 2018-01-15 DIAGNOSIS — E119 Type 2 diabetes mellitus without complications: Secondary | ICD-10-CM | POA: Diagnosis not present

## 2018-01-15 DIAGNOSIS — I10 Essential (primary) hypertension: Secondary | ICD-10-CM | POA: Diagnosis not present

## 2018-01-17 DIAGNOSIS — I10 Essential (primary) hypertension: Secondary | ICD-10-CM | POA: Diagnosis not present

## 2018-01-17 DIAGNOSIS — R262 Difficulty in walking, not elsewhere classified: Secondary | ICD-10-CM | POA: Diagnosis not present

## 2018-01-17 DIAGNOSIS — R2681 Unsteadiness on feet: Secondary | ICD-10-CM | POA: Diagnosis not present

## 2018-01-17 DIAGNOSIS — H8191 Unspecified disorder of vestibular function, right ear: Secondary | ICD-10-CM | POA: Diagnosis not present

## 2018-01-19 DIAGNOSIS — L814 Other melanin hyperpigmentation: Secondary | ICD-10-CM | POA: Diagnosis not present

## 2018-01-19 DIAGNOSIS — Z85820 Personal history of malignant melanoma of skin: Secondary | ICD-10-CM | POA: Diagnosis not present

## 2018-01-19 DIAGNOSIS — L821 Other seborrheic keratosis: Secondary | ICD-10-CM | POA: Diagnosis not present

## 2018-01-19 DIAGNOSIS — Z85828 Personal history of other malignant neoplasm of skin: Secondary | ICD-10-CM | POA: Diagnosis not present

## 2018-02-20 DIAGNOSIS — H3552 Pigmentary retinal dystrophy: Secondary | ICD-10-CM | POA: Diagnosis not present

## 2018-02-20 DIAGNOSIS — H02402 Unspecified ptosis of left eyelid: Secondary | ICD-10-CM | POA: Diagnosis not present

## 2018-03-27 DIAGNOSIS — E119 Type 2 diabetes mellitus without complications: Secondary | ICD-10-CM | POA: Diagnosis not present

## 2018-03-27 DIAGNOSIS — R82998 Other abnormal findings in urine: Secondary | ICD-10-CM | POA: Diagnosis not present

## 2018-03-27 DIAGNOSIS — I10 Essential (primary) hypertension: Secondary | ICD-10-CM | POA: Diagnosis not present

## 2018-03-27 DIAGNOSIS — M810 Age-related osteoporosis without current pathological fracture: Secondary | ICD-10-CM | POA: Diagnosis not present

## 2018-03-27 DIAGNOSIS — E7800 Pure hypercholesterolemia, unspecified: Secondary | ICD-10-CM | POA: Diagnosis not present

## 2018-04-02 DIAGNOSIS — E119 Type 2 diabetes mellitus without complications: Secondary | ICD-10-CM | POA: Diagnosis not present

## 2018-04-03 ENCOUNTER — Ambulatory Visit: Payer: Medicare Other | Admitting: Cardiovascular Disease

## 2018-04-03 ENCOUNTER — Encounter: Payer: Self-pay | Admitting: Cardiovascular Disease

## 2018-04-03 VITALS — BP 116/68 | HR 68 | Ht 66.0 in | Wt 165.1 lb

## 2018-04-03 DIAGNOSIS — E119 Type 2 diabetes mellitus without complications: Secondary | ICD-10-CM | POA: Diagnosis not present

## 2018-04-03 DIAGNOSIS — Z0000 Encounter for general adult medical examination without abnormal findings: Secondary | ICD-10-CM | POA: Diagnosis not present

## 2018-04-03 DIAGNOSIS — I110 Hypertensive heart disease with heart failure: Secondary | ICD-10-CM | POA: Diagnosis not present

## 2018-04-03 DIAGNOSIS — G6289 Other specified polyneuropathies: Secondary | ICD-10-CM | POA: Diagnosis not present

## 2018-04-03 DIAGNOSIS — M810 Age-related osteoporosis without current pathological fracture: Secondary | ICD-10-CM | POA: Diagnosis not present

## 2018-04-03 DIAGNOSIS — I5032 Chronic diastolic (congestive) heart failure: Secondary | ICD-10-CM

## 2018-04-03 DIAGNOSIS — R55 Syncope and collapse: Secondary | ICD-10-CM

## 2018-04-03 NOTE — Progress Notes (Signed)
Cardiology Office Note:    Date:  04/03/2018   ID:  [name], Labrum 1940/08/04, MRN [id number]  PCP:  Haywood Pao, MD  Cardiologist:  Mertie Moores, MD  Electrophysiologist:  None   Referring MD: Haywood Pao, MD   Problem list 1.  Chronic diastolic congestive heart failure 2.  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  Sinus tachycardia 3.  Diabetes mellitus 4.  Hypertension 5.  Coronary artery disease  Chief Complaint  Patient presents with  . Congestive Heart Failure        Seen with sister, Hoyle Sauer.  Angela Rosario is a 78 y.o. female with a hx of chronic diastolic congestive heart failure.  Last saw Angela Rosario in December, 2013.  Since that time she has been seen in the advanced heart failure clinic ( [facility] for Herceptin therapy)  We were asked to see her today for a follow-up visit regarding a blackout episode that happened 4 months ago by  Dr. Osborne Casco.   She recently had a fall in the bathroom which prompted her visit today .  Was preceded by a brief black out episode  Woke up as soon as she hit the ground.   No severe injuries .  Hit her head.  No episodes since then Has developed vertigo   She walks a mile a day .Marland Kitchen  No CP or dyspnea or dizziness.  Has been started on Jardiance   Is clear from her breast cancer .     Past Medical History:  Diagnosis Date  . AC (acromioclavicular) joint bone spurs    bone spurs in neck  . Arthritis    oa  . Breast cancer ([place])   . Carcinoma of breast treated with adjuvant chemotherapy ([place])     Three weeks of Herceptin therapy that started on 11/19/2012 and adjuvant chemotherapy consisting of [place] started on 12/10/2012. She completed therapy [place] therapy on 02/18/13 and began adjuvant every 3 week Herceptin on 02/25/13.   . Diabetes mellitus    niddm; last A1C 6.2  . Diastolic dysfunction   . Episode of dizziness    Mild episodes  . Hyperlipidemia   .  Hypertension   . Orthostatic hypotension    Some episodes  . 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 S/P radiation therapy  04/01/2013-05/14/2013   1) Right breast / 50 Gy in 25 fractions, 2) Right breast boost / 10 Gy in 5 fractions  . Skin cancer    multiple sites (neck, nose, etc.)  . Stress incontinence, female    wears pads  . Syncope   . Wears glasses     Past Surgical History:  Procedure Laterality Date  . ABDOMINAL HYSTERECTOMY  1985   1 ovary removed  . BREAST LUMPECTOMY WITH NEEDLE LOCALIZATION AND AXILLARY SENTINEL LYMPH NODE BX Right 10/04/2012   Procedure: RIGHT BREAST LUMPECTOMY WITH NEEDLE LOCALIZATION AND AXILLARY SENTINEL LYMPH NODE BX;  Surgeon: [name], MD;  Location: [place];  Service: General;  Laterality: Right;  . CHOLECYSTECTOMY  1992  . ORIF WRIST FRACTURE Left 11/06/2012   Procedure: LEFT OPEN REDUCTION INTERNAL FIXATION (ORIF) DISTAL RADIUS WRIST FRACTURE;  Surgeon: Tennis Must, MD;  Location: [place];  Service: Orthopedics;  Laterality: Left;  . OTHER SURGICAL HISTORY     Hysterectomy  . PORTACATH PLACEMENT Left 10/04/2012   Procedure: INSERTION PORT-A-CATH;  Surgeon: [name],  MD;  Location: [place];  Service: General;  Laterality: Left;  . PUBOVAGINAL SLING  02/03/2012   Procedure: Gaynelle Arabian;  Surgeon: Franchot Gallo, MD;  Location[facility];  Service: Urology;  Laterality: N/A;  1 HR  LYNX SLING  . TONSILLECTOMY    . TOTAL HIP ARTHROPLASTY  02/23/2011   Procedure: TOTAL HIP ARTHROPLASTY;  Surgeon: Dione Plover Aluisio;  Location: [facility];  Service: Orthopedics;  Laterality: Left;    Current Medications: Current Meds  Medication Sig  . empagliflozin (JARDIANCE) 10 MG TABS tablet Take 10 mg by mouth daily.  Marland Kitchen letrozole (FEMARA) 2.5 MG tablet TAKE 1 TABLET ONCE DAILY.  Marland Kitchen losartan (COZAAR) 25 MG tablet TAKE 1 TABLET ONCE DAILY.  Marland Kitchen  lovastatin (MEVACOR) 10 MG tablet Take 10 mg by mouth at bedtime.   . Multiple Vitamin (MULTIVITAMIN) capsule Take 1 capsule by mouth daily.  . prednisoLONE acetate (PRED FORTE) 1 % ophthalmic suspension Place 1 drop into both eyes 2 (two) times daily.  Marland Kitchen sulfamethoxazole-trimethoprim (BACTRIM DS,SEPTRA DS) 800-160 MG tablet Take 1 tablet by mouth 2 (two) times daily.     Allergies:   Oxycodone   Social History   Socioeconomic History  . Marital status: Single    Spouse name: Not on file  . Number of children: Not on file  . Years of education: Not on file  . Highest education level: Not on file  Occupational History  . Not on file  Social Needs  . Financial resource strain: Not on file  . Food insecurity:    Worry: Not on file    Inability: Not on file  . Transportation needs:    Medical: Not on file    Non-medical: Not on file  Tobacco Use  . Smoking status: Former Smoker    Packs/day: 0.25    Years: 1.00    Pack years: 0.25    Last attempt to quit: 02/14/1957    Years since quitting: 61.1  . Smokeless tobacco: Never Used  Substance and Sexual Activity  . Alcohol use: No  . Drug use: No  . Sexual activity: Not Currently    Birth control/protection: Surgical  Lifestyle  . Physical activity:    Days per week: Not on file    Minutes per session: Not on file  . Stress: Not on file  Relationships  . Social connections:    Talks on phone: Not on file    Gets together: Not on file    Attends religious service: Not on file    Active member of club or organization: Not on file    Attends meetings of clubs or organizations: Not on file    Relationship status: Not on file  Other Topics Concern  . Not on file  Social History Narrative   Divorced, lives alone with her poodle     Family History: The patient's family history includes Breast cancer in her paternal aunt; Breast cancer (age of onset: 58) in her paternal uncle; Lung cancer in her maternal uncle; Prostate  cancer in her cousin.  ROS:   Please see the history of present illness.     All other systems reviewed and are negative.  EKGs/Labs/Other Studies Reviewed:    The following studies were reviewed today:   EKG:   April 03, 2018: Normal sinus rhythm at 68 beats minute.  Incomplete left bundle branch block.  Poor R wave progression-likely lead placement.  Recent Labs: No results found for requested  labs within last 8760 hours.  Recent Lipid Panel No results found for: CHOL, TRIG, HDL, CHOLHDL, VLDL, LDLCALC, LDLDIRECT  Physical Exam:    VS:  BP 116/68   Pulse 68   Ht 5\' 6"  (1.676 m)   Wt 165 lb 1.9 oz (74.9 kg)   SpO2 96%   BMI 26.65 kg/m     Wt Readings from Last 3 Encounters:  04/03/18 165 lb 1.9 oz (74.9 kg)  09/12/17 171 lb 6.4 oz (77.7 kg)  08/30/16 183 lb (83 kg)     GEN:  Well nourished, well developed in no acute distress HEENT: Normal NECK: No JVD; No carotid bruits LYMPHATICS: No lymphadenopathy CARDIAC: RRR, no murmurs, rubs, gallops RESPIRATORY:  Clear to auscultation without rales, wheezing or rhonchi  ABDOMEN: Soft, non-tender, non-distended MUSCULOSKELETAL:  No edema; No deformity  SKIN: Warm and dry NEUROLOGIC:  Alert and oriented x 3 PSYCHIATRIC:  Normal affect   ASSESSMENT:    1. Syncope, unspecified syncope type   2. Chronic diastolic (congestive) heart failure (HCC)    PLAN:    In order of problems listed above:  1. Syncope: Presents for further evaluation of an episode of syncope that occurred around Thanksgiving.  She had a brief episode of passing out.  The episode only lasted for a few seconds.  She woke up as soon as she hit the ground.  Is not had any recurrent episodes of syncope or presyncope.  She exercises on a fairly regular basis.  I would like to place a monitor on her for 2 weeks.  We will evaluate her heart rhythm to see if she is having any pauses or episodes of tachycardia. Although also think it is reasonable to repeat  her echocardiogram.  She has a history of mildly depressed left ventricular systolic function.     Medication Adjustments/Labs and Tests Ordered: Current medicines are reviewed at length with the patient today.  Concerns regarding medicines are outlined above.  Orders Placed This Encounter  Procedures  . Cardiac event monitor  . EKG 12-Lead  . ECHOCARDIOGRAM COMPLETE   No orders of the defined types were placed in this encounter.   Patient Instructions  Medication Instructions:  Your physician recommends that you continue on your current medications as directed. Please refer to the Current Medication list given to you today.  If you need a refill on your cardiac medications before your next appointment, please call your pharmacy.    Lab work: None Ordered   Testing/Procedures: Your physician has requested that you have an echocardiogram. Echocardiography is a painless test that uses sound waves to create images of your heart. It provides your doctor with information about the size and shape of your heart and how well your heart's chambers and valves are working. This procedure takes approximately one hour. There are no restrictions for this procedure.  Your physician has recommended that you wear an event monitor. Event monitors are medical devices that record the heart's electrical activity. Doctors most often [place] these monitors to diagnose arrhythmias. Arrhythmias are problems with the speed or rhythm of the heartbeat. The monitor is a small, portable device. You can wear one while you do your normal daily activities. This is usually used to diagnose what is causing palpitations/syncope (passing out).    Follow-Up: At [facility], you and your health needs are our priority.  As part of our continuing mission to provide you with exceptional heart care, we have created designated Provider Care Teams.  These  Care Teams include your primary Cardiologist (physician) and Advanced  Practice Providers (APPs -  Physician Assistants and Nurse Practitioners) who all work together to provide you with the care you need, when you need it. You will need a follow up appointment in:  3 months.  You may see Mertie Moores, MD or one of the following Advanced Practice Providers on your designated Care Team: Richardson Dopp, PA-C [place], [place] . Daune Perch, NP      Signed, Mertie Moores, MD  04/03/2018 5:45 PM    [place]

## 2018-04-03 NOTE — Patient Instructions (Signed)
Medication Instructions:  Your physician recommends that you continue on your current medications as directed. Please refer to the Current Medication list given to you today.  If you need a refill on your cardiac medications before your next appointment, please call your pharmacy.    Lab work: None Ordered   Testing/Procedures: Your physician has requested that you have an echocardiogram. Echocardiography is a painless test that uses sound waves to create images of your heart. It provides your doctor with information about the size and shape of your heart and how well your heart's chambers and valves are working. This procedure takes approximately one hour. There are no restrictions for this procedure.  Your physician has recommended that you wear an event monitor. Event monitors are medical devices that record the heart's electrical activity. Doctors most often [place] these monitors to diagnose arrhythmias. Arrhythmias are problems with the speed or rhythm of the heartbeat. The monitor is a small, portable device. You can wear one while you do your normal daily activities. This is usually used to diagnose what is causing palpitations/syncope (passing out).    Follow-Up: At [facility], you and your health needs are our priority.  As part of our continuing mission to provide you with exceptional heart care, we have created designated Provider Care Teams.  These Care Teams include your primary Cardiologist (physician) and Advanced Practice Providers (APPs -  Physician Assistants and Nurse Practitioners) who all work together to provide you with the care you need, when you need it. You will need a follow up appointment in:  3 months.  You may see Mertie Moores, MD or one of the following Advanced Practice Providers on your designated Care Team: Richardson Dopp, PA-C [place], [place] . Daune Perch, NP

## 2018-04-05 ENCOUNTER — Other Ambulatory Visit: Payer: Self-pay | Admitting: Hematology and Oncology

## 2018-04-05 DIAGNOSIS — C50111 Malignant neoplasm of central portion of right female breast: Secondary | ICD-10-CM

## 2018-04-05 DIAGNOSIS — Z170 Estrogen receptor positive status [ER+]: Principal | ICD-10-CM

## 2018-04-06 DIAGNOSIS — Z1212 Encounter for screening for malignant neoplasm of rectum: Secondary | ICD-10-CM | POA: Diagnosis not present

## 2018-04-10 DIAGNOSIS — E1351 Other specified diabetes mellitus with diabetic peripheral angiopathy without gangrene: Secondary | ICD-10-CM | POA: Diagnosis not present

## 2018-04-10 DIAGNOSIS — L602 Onychogryphosis: Secondary | ICD-10-CM | POA: Diagnosis not present

## 2018-04-13 ENCOUNTER — Ambulatory Visit (HOSPITAL_COMMUNITY): Payer: Medicare Other | Attending: Cardiovascular Disease

## 2018-04-13 ENCOUNTER — Ambulatory Visit (INDEPENDENT_AMBULATORY_CARE_PROVIDER_SITE_OTHER): Payer: Medicare Other

## 2018-04-13 DIAGNOSIS — I5032 Chronic diastolic (congestive) heart failure: Secondary | ICD-10-CM

## 2018-04-13 DIAGNOSIS — R55 Syncope and collapse: Secondary | ICD-10-CM | POA: Insufficient documentation

## 2018-05-18 ENCOUNTER — Telehealth: Payer: Self-pay | Admitting: Cardiovascular Disease

## 2018-05-18 NOTE — Telephone Encounter (Signed)
Reviewed results of monitor with patient who states understanding.  She will keep her upcoming appt as planned in May but is aware it may be changed to a virtual visit if necessary.

## 2018-05-18 NOTE — Telephone Encounter (Signed)
Pt called back returning a nurse's call. She did not leave a name, only that she was calling on behalf of Dr. Acie Fredrickson.  She is leaving the house temporarily, and will be home by 3:30 pm

## 2018-05-18 NOTE — Telephone Encounter (Signed)
Appears Sharyn Lull called pt to review her monitor results - see Dr Elmarie Shiley notes below   Notes recorded by Emmaline Life, RN on 05/18/2018 at 11:01 AM EDT Left message for patient to call office for results  Notes recorded by Nahser, Wonda Cheng, MD on 05/16/2018 at 5:20 PM EDT No significant arrhythmias

## 2018-06-15 DIAGNOSIS — L602 Onychogryphosis: Secondary | ICD-10-CM | POA: Diagnosis not present

## 2018-06-15 DIAGNOSIS — L720 Epidermal cyst: Secondary | ICD-10-CM | POA: Diagnosis not present

## 2018-06-15 DIAGNOSIS — E1351 Other specified diabetes mellitus with diabetic peripheral angiopathy without gangrene: Secondary | ICD-10-CM | POA: Diagnosis not present

## 2018-06-15 DIAGNOSIS — C44612 Basal cell carcinoma of skin of right upper limb, including shoulder: Secondary | ICD-10-CM | POA: Diagnosis not present

## 2018-06-15 DIAGNOSIS — Z85828 Personal history of other malignant neoplasm of skin: Secondary | ICD-10-CM | POA: Diagnosis not present

## 2018-06-15 DIAGNOSIS — Z85820 Personal history of malignant melanoma of skin: Secondary | ICD-10-CM | POA: Diagnosis not present

## 2018-06-15 DIAGNOSIS — L821 Other seborrheic keratosis: Secondary | ICD-10-CM | POA: Diagnosis not present

## 2018-06-28 DIAGNOSIS — H26493 Other secondary cataract, bilateral: Secondary | ICD-10-CM | POA: Diagnosis not present

## 2018-06-28 DIAGNOSIS — H35353 Cystoid macular degeneration, bilateral: Secondary | ICD-10-CM | POA: Diagnosis not present

## 2018-06-28 DIAGNOSIS — Z961 Presence of intraocular lens: Secondary | ICD-10-CM | POA: Diagnosis not present

## 2018-06-29 DIAGNOSIS — E119 Type 2 diabetes mellitus without complications: Secondary | ICD-10-CM | POA: Diagnosis not present

## 2018-07-06 ENCOUNTER — Telehealth: Payer: Self-pay

## 2018-07-06 NOTE — Telephone Encounter (Signed)
Obtained verbal consent from pt for video visit. Pt will be getting a bp cuff from her sister for visit. She will have vitals ready.   YOUR CARDIOLOGY TEAM HAS ARRANGED FOR AN E-VISIT FOR YOUR APPOINTMENT - PLEASE REVIEW IMPORTANT INFORMATION BELOW SEVERAL DAYS PRIOR TO YOUR APPOINTMENT  Due to the recent COVID-19 pandemic, we are transitioning in-person office visits to tele-medicine visits in an effort to decrease unnecessary exposure to our patients, their families, and staff. These visits are billed to your insurance just like a normal visit is. We also encourage you to sign up for MyChart if you have not already done so. You will need a smartphone if possible. For patients that do not have this, we can still complete the visit using a regular telephone but do prefer a smartphone to enable video when possible. You may have a family member that lives with you that can help. If possible, we also ask that you have a blood pressure cuff and scale at home to measure your blood pressure, heart rate and weight prior to your scheduled appointment. Patients with clinical needs that need an in-person evaluation and testing will still be able to come to the office if absolutely necessary. If you have any questions, feel free to call our office.     YOUR PROVIDER WILL BE USING THE FOLLOWING PLATFORM TO COMPLETE YOUR VISIT: [email or web address] . IF USING MYCHART - How to Download the MyChart App to Your SmartPhone   - If Apple, go to [employer] and type in MyChart in the search bar and download the app. If Android, ask patient to go to [employer] and type in [place] in the search bar and download the app. The app is free but as with any other app downloads, your phone may require you to verify saved payment information or Apple/Android password.  - You will need to then log into the app with your MyChart username and password, and select [facility] as your healthcare provider to link the account.  - When it is time  for your visit, go to the MyChart app, find appointments, and click Begin Video Visit. Be sure to Select Allow for your device to access the Microphone and Camera for your visit. You will then be connected, and your provider will be with you shortly.  **If you have any issues connecting or need assistance, please contact MyChart service desk [phone])**  **If using a computer, in order to ensure the best quality for your visit, you will need to use either of the following Internet Browsers: Insurance underwriter or [employer]**  . IF USING DOXIMITY or [email or web address].ME - The staff will give you instructions on receiving your link to join the meeting the day of your visit.      2-3 DAYS BEFORE YOUR APPOINTMENT  You will receive a telephone call from one of our [facility] team members - your caller ID may say "Unknown caller." If this is a video visit, we will walk you through how to get the video launched on your phone. We will remind you check your blood pressure, heart rate and weight prior to your scheduled appointment. If you have an Apple Watch or Kardia, please upload any pertinent ECG strips the day before or morning of your appointment to [place]. Our staff will also make sure you have reviewed the consent and agree to move forward with your scheduled tele-health visit.     THE DAY OF YOUR APPOINTMENT  Approximately 15  minutes prior to your scheduled appointment, you will receive a telephone call from one of [facility] team - your caller ID may say "Unknown caller."  Our staff will confirm medications, vital signs for the day and any symptoms you may be experiencing. Please have this information available prior to the time of visit start. It may also be helpful for you to have a pad of paper and pen handy for any instructions given during your visit. They will also walk you through joining the smartphone meeting if this is a video visit.    CONSENT FOR TELE-HEALTH VISIT - PLEASE  REVIEW  I hereby voluntarily request, consent and authorize [facility] and its employed or contracted physicians, physician assistants, nurse practitioners or other licensed health care professionals (the Practitioner), to provide me with telemedicine health care services (the "Services") as deemed necessary by the treating Practitioner. I acknowledge and consent to receive the Services by the Practitioner via telemedicine. I understand that the telemedicine visit will involve communicating with the Practitioner through live audiovisual communication technology and the disclosure of certain medical information by electronic transmission. I acknowledge that I have been given the opportunity to request an in-person assessment or other available alternative prior to the telemedicine visit and am voluntarily participating in the telemedicine visit.  I understand that I have the right to withhold or withdraw my consent to the use of telemedicine in the course of my care at any time, without affecting my right to future care or treatment, and that the Practitioner or I may terminate the telemedicine visit at any time. I understand that I have the right to inspect all information obtained and/or recorded in the course of the telemedicine visit and may receive copies of available information for a reasonable fee.  I understand that some of the potential risks of receiving the Services via telemedicine include:  Marland Kitchen Delay or interruption in medical evaluation due to technological equipment failure or disruption; . Information transmitted may not be sufficient (e.g. poor resolution of images) to allow for appropriate medical decision making by the Practitioner; and/or  . In rare instances, security protocols could fail, causing a breach of personal health information.  Furthermore, I acknowledge that it is my responsibility to provide information about my medical history, conditions and care that is complete and  accurate to the best of my ability. I acknowledge that Practitioner's advice, recommendations, and/or decision may be based on factors not within their control, such as incomplete or inaccurate data provided by me or distortions of diagnostic images or specimens that may result from electronic transmissions. I understand that the practice of medicine is not an exact science and that Practitioner makes no warranties or guarantees regarding treatment outcomes. I acknowledge that I will receive a copy of this consent concurrently upon execution via email to the email address I last provided but may also request a printed copy by calling the office of [facility].    I understand that my insurance will be billed for this visit.   I have read or had this consent read to me. . I understand the contents of this consent, which adequately explains the benefits and risks of the Services being provided via telemedicine.  . I have been provided ample opportunity to ask questions regarding this consent and the Services and have had my questions answered to my satisfaction. . I give my informed consent for the services to be provided through the use of telemedicine in my medical care  By participating in this telemedicine visit I agree to the above.

## 2018-07-10 ENCOUNTER — Telehealth: Payer: Self-pay | Admitting: Cardiovascular Disease

## 2018-07-10 NOTE — Telephone Encounter (Signed)
Patient sister would like to speak to nurse about Remy's up coming appt. She has some questions about the format to the appt.

## 2018-07-10 NOTE — Telephone Encounter (Signed)
Reviewed instructions for doxy.me with patient's sister and answered questions to her satisfaction

## 2018-07-11 ENCOUNTER — Other Ambulatory Visit: Payer: Self-pay | Admitting: Hematology and Oncology

## 2018-07-11 DIAGNOSIS — Z170 Estrogen receptor positive status [ER+]: Secondary | ICD-10-CM

## 2018-07-11 DIAGNOSIS — C50111 Malignant neoplasm of central portion of right female breast: Secondary | ICD-10-CM

## 2018-07-13 ENCOUNTER — Telehealth (INDEPENDENT_AMBULATORY_CARE_PROVIDER_SITE_OTHER): Payer: Medicare Other | Admitting: Cardiovascular Disease

## 2018-07-13 ENCOUNTER — Other Ambulatory Visit: Payer: Self-pay

## 2018-07-13 ENCOUNTER — Encounter: Payer: Self-pay | Admitting: Cardiovascular Disease

## 2018-07-13 VITALS — BP 140/80 | HR 75 | Ht 66.0 in | Wt 166.0 lb

## 2018-07-13 DIAGNOSIS — R55 Syncope and collapse: Secondary | ICD-10-CM | POA: Diagnosis not present

## 2018-07-13 DIAGNOSIS — Z7189 Other specified counseling: Secondary | ICD-10-CM | POA: Diagnosis not present

## 2018-07-13 NOTE — Patient Instructions (Signed)
Medication Instructions:  Your physician recommends that you continue on your current medications as directed. Please refer to the Current Medication list given to you today.  If you need a refill on your cardiac medications before your next appointment, please call your pharmacy.    Lab work: None Ordered    Testing/Procedures: None Ordered   Follow-Up: At [facility], you and your health needs are our priority.  As part of our continuing mission to provide you with exceptional heart care, we have created designated Provider Care Teams.  These Care Teams include your primary Cardiologist (physician) and Advanced Practice Providers (APPs -  Physician Assistants and Nurse Practitioners) who all work together to provide you with the care you need, when you need it. You will need a follow up appointment in:   As Needed.  Please call our office 2 months in advance to schedule this appointment.  You may see Mertie Moores, MD or one of the following Advanced Practice Providers on your designated Care Team: Richardson Dopp, PA-C [place], [place] . Daune Perch, NP

## 2018-07-13 NOTE — Progress Notes (Signed)
Virtual Visit via Video Note   This visit type was conducted due to national recommendations for restrictions regarding the COVID-19 Pandemic (e.g. social distancing) in an effort to limit this patient's exposure and mitigate transmission in our community.  Due to her co-morbid illnesses, this patient is at least at moderate risk for complications without adequate follow up.  This format is felt to be most appropriate for this patient at this time.  All issues noted in this document were discussed and addressed.  A limited physical exam was performed with this format.  Please refer to the patient's chart for her consent to telehealth for Angela Rosario.   Date:  07/13/2018   ID:  Angela Rosario, DOB 10/12/1940, MRN [id number]  Patient Location: Home Provider Location: Home  PCP:  Tis[place], Angela Him, MD  Cardiologist:  Mertie Moores, MD  Electrophysiologist:  None   Problem list 1.  Chronic diastolic congestive heart failure 2.  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  Sinus tachycardia 3.  Diabetes mellitus 4.  Hypertension 5.  Coronary artery disease     Chief Complaint  Patient presents with  . Congestive Heart Failure        Seen with sister, Angela Rosario.  Angela Rosario is a 78 y.o. female with a hx of chronic diastolic congestive heart failure.  Last saw Angela Rosario in December, 2013.  Since that time she has been seen in the advanced heart failure clinic ( [facility] for Herceptin therapy)  We were asked to see her today for a follow-up visit regarding a blackout episode that happened 4 months ago by  Angela Rosario.   She recently had a fall in the bathroom which prompted her visit today .  Was preceded by a brief black out episode  Woke up as soon as she hit the ground.   No severe injuries .  Hit her head.  No episodes since then Has developed vertigo   She walks a mile a day .Marland Kitchen  No CP or dyspnea or dizziness.  Has  been started on Jardiance   Is clear from her breast cancer   Jul 13, 2018      Evaluation Performed:  Follow-Up Visit  episode of syncope   Chief Complaint:  Syncopal episode,     Angela Rosario is a 78 y.o. female with a single ep[isode of syncope Breathing is fine No cp , no dyspnea Walks a mile a day    The patient does not have symptoms concerning for COVID-19 infection (fever, chills, cough, or new shortness of breath).    Past Medical History:  Diagnosis Date  . AC (acromioclavicular) joint bone spurs    bone spurs in neck  . Arthritis    oa  . Breast cancer ([place])   . Carcinoma of breast treated with adjuvant chemotherapy ([place])     Three weeks of Herceptin therapy that started on 11/19/2012 and adjuvant chemotherapy consisting of [place] started on 12/10/2012. She completed therapy [place] therapy on 02/18/13 and began adjuvant every 3 week Herceptin on 02/25/13.   . Diabetes mellitus    niddm; last A1C 6.2  . Diastolic dysfunction   . Episode of dizziness    Mild episodes  . Hyperlipidemia   . Hypertension   . Orthostatic hypotension    Some episodes  . 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 S/P radiation therapy  04/01/2013-05/14/2013   1)  Right breast / 50 Gy in 25 fractions, 2) Right breast boost / 10 Gy in 5 fractions  . Skin cancer    multiple sites (neck, nose, etc.)  . Stress incontinence, female    wears pads  . Syncope   . Wears glasses    Past Surgical History:  Procedure Laterality Date  . ABDOMINAL HYSTERECTOMY  1985   1 ovary removed  . BREAST LUMPECTOMY WITH NEEDLE LOCALIZATION AND AXILLARY SENTINEL LYMPH NODE BX Right 10/04/2012   Procedure: RIGHT BREAST LUMPECTOMY WITH NEEDLE LOCALIZATION AND AXILLARY SENTINEL LYMPH NODE BX;  Surgeon: [name], MD;  Location: [facility];  Service: General;  Laterality: Right;  . CHOLECYSTECTOMY  1992  . ORIF WRIST FRACTURE Left 11/06/2012   Procedure: LEFT OPEN  REDUCTION INTERNAL FIXATION (ORIF) DISTAL RADIUS WRIST FRACTURE;  Surgeon: Tennis Must, MD;  Location: [facility];  Service: Orthopedics;  Laterality: Left;  . OTHER SURGICAL HISTORY     Hysterectomy  . PORTACATH PLACEMENT Left 10/04/2012   Procedure: INSERTION PORT-A-CATH;  Surgeon: [name], MD;  Location: [facility];  Service: General;  Laterality: Left;  . PUBOVAGINAL SLING  02/03/2012   Procedure: Gaynelle Arabian;  Surgeon: Franchot Gallo, MD;  Location: [facility];  Service: Urology;  Laterality: N/A;  1 HR  LYNX SLING  . TONSILLECTOMY    . TOTAL HIP ARTHROPLASTY  02/23/2011   Procedure: TOTAL HIP ARTHROPLASTY;  Surgeon: Dione Plover Aluisio;  Location: [facility];  Service: Orthopedics;  Laterality: Left;     Current Meds  Medication Sig  . JARDIANCE 25 MG TABS tablet 25 mg daily.   Marland Kitchen letrozole (FEMARA) 2.5 MG tablet TAKE 1 TABLET ONCE DAILY.  Marland Kitchen losartan (COZAAR) 25 MG tablet TAKE 1 TABLET ONCE DAILY.  Marland Kitchen lovastatin (MEVACOR) 10 MG tablet Take 10 mg by mouth at bedtime.   . Multiple Vitamin (MULTIVITAMIN) capsule Take 1 capsule by mouth daily.  . prednisoLONE acetate (PRED FORTE) 1 % ophthalmic suspension Place 1 drop into both eyes 2 (two) times daily.  Marland Kitchen sulfamethoxazole-trimethoprim (BACTRIM DS,SEPTRA DS) 800-160 MG tablet Take 1 tablet by mouth 2 (two) times daily.  . [DISCONTINUED] empagliflozin (JARDIANCE) 10 MG TABS tablet Take 10 mg by mouth daily.     Allergies:   Oxycodone   Social History   Tobacco Use  . Smoking status: Former Smoker    Packs/day: 0.25    Years: 1.00    Pack years: 0.25    Last attempt to quit: 02/14/1957    Years since quitting: 61.4  . Smokeless tobacco: Never Used  Substance Use Topics  . Alcohol use: No  . Drug use: No     Family Hx: The patient's family history includes Breast cancer in her paternal aunt; Breast cancer (age of onset: 26) in her paternal uncle; Lung cancer in her  maternal uncle; Prostate cancer in her cousin.  ROS:   Please see the history of present illness.     All other systems reviewed and are negative.   Prior CV studies:   The following studies were reviewed today:    Labs/Other Tests and Data Reviewed:    EKG:  No ECG reviewed.  Recent Labs: No results found for requested labs within last 8760 hours.   Recent Lipid Panel No results found for: CHOL, TRIG, HDL, CHOLHDL, LDLCALC, LDLDIRECT  Wt Readings from Last 3 Encounters:  07/13/18 166 lb (75.3 kg)  04/03/18 165 lb 1.9 oz (74.9  kg)  09/12/17 171 lb 6.4 oz (77.7 kg)     Objective:    Vital Signs:  BP 140/80   Pulse 75   Ht 5\' 6"  (1.676 m)   Wt 166 lb (75.3 kg)   BMI 26.79 kg/m    VITAL SIGNS:  reviewed GEN:  no acute distress EYES:  sclerae anicteric, EOMI - Extraocular Movements Intact RESPIRATORY:  normal respiratory effort, symmetric expansion CARDIOVASCULAR:  no peripheral edema SKIN:  no rash, lesions or ulcers. MUSCULOSKELETAL:  no obvious deformities. NEURO:  alert and oriented x 3, no obvious focal deficit PSYCH:  normal affect  ASSESSMENT & PLAN:    1. Syncope:   Occurred approximately 6 or 7 months ago.  She is not had any recurrent episodes of syncope.  Her echocardiogram was essentially normal.  She has normal systolic and normal diastolic function.  She is exercising on a regular basis.  She is not having any episodes of chest pain or shortness of breath.  At this point I think you we can see her on an as-needed basis.  I be happy to follow-up if she has any recurrent episodes of syncope.  COVID-19 Education: The signs and symptoms of COVID-19 were discussed with the patient and how to seek care for testing (follow up with PCP or arrange E-visit).  The importance of social distancing was discussed today.  Time:   Today, I have spent  18 minutes with the patient with telehealth technology discussing the above problems.     Medication  Adjustments/Labs and Tests Ordered: Current medicines are reviewed at length with the patient today.  Concerns regarding medicines are outlined above.   Tests Ordered: No orders of the defined types were placed in this encounter.   Medication Changes: No orders of the defined types were placed in this encounter.   Disposition:  Follow up prn  Signed, Mertie Moores, MD  07/13/2018 8:31 AM    [facility]

## 2018-07-28 DIAGNOSIS — Z20828 Contact with and (suspected) exposure to other viral communicable diseases: Secondary | ICD-10-CM | POA: Diagnosis not present

## 2018-08-16 DIAGNOSIS — E1351 Other specified diabetes mellitus with diabetic peripheral angiopathy without gangrene: Secondary | ICD-10-CM | POA: Diagnosis not present

## 2018-08-16 DIAGNOSIS — L602 Onychogryphosis: Secondary | ICD-10-CM | POA: Diagnosis not present

## 2018-08-30 DIAGNOSIS — Z0120 Encounter for dental examination and cleaning without abnormal findings: Secondary | ICD-10-CM | POA: Diagnosis not present

## 2018-09-06 NOTE — Assessment & Plan Note (Signed)
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 Status post lumpectomy 10/04/2012 1.6 cm 0/5 lymph nodes T1 cN0 M0 stage IA status post adjuvant chemotherapy with [place] followed by Herceptin maintenance completed 10/23/2013, adjuvant radiation therapy was also complete and is now on adjuvant antiestrogen therapy with Femara started April 2015. Plan treatment duration 5 years  Aromatase inhibitor toxicities: vaginal dryness.  Surveillance: 1. Breast exam 7/30/2019benign 2. Mammogram10/29/2019 at [facility]. 3. Bone density April 2015: normal T score +3.6 in the spine  Return to clinic in 1 year for follow-up

## 2018-09-10 NOTE — Progress Notes (Signed)
Patient Care Team: Tisovec, Fransico Him, MD as PCP - General (Internal Medicine) Nahser, Wonda Cheng, MD as PCP - Cardiology (Cardiology)  DIAGNOSIS:    ICD-10-CM   1. Malignant neoplasm of central portion of right breast in female, estrogen receptor positive ([place])  C50.111    Z17.0     SUMMARY OF ONCOLOGIC HISTORY: Oncology History  Cancer of central portion of right female breast ([place])  09/11/2012 Mammogram   Right breast mass 7 mm at 12:00 position   09/12/2012 Initial Biopsy   Invasive mammary cancer with lobular features grade 2 ER 100%, PR percent, TSH some 45%, HER-2 positive ratio 2.96   09/18/2012 Breast MRI   Right breast mass 1.5 x 1.7 x 1.5 cm, no abnormal lymph nodes clinical stage TI cN0 M0 stage IA   10/04/2012 Surgery   Right breast lumpectomy 1.6 cm IDC grade 2 with associated intermediate grade DCIS tumor involving the dermis and lymphovascular invasion ER 100%, PR 100%, just some 45%, HER-2 positive, 0/5 lymph nodes   11/19/2012 - 10/23/2013 Chemotherapy   Adjuvant chemotherapy: [place] complicated by a fall 84/13/2440 fracture required surgery) resume chemotherapy 12/10/2012 completed [facility] 02/18/2013, Herceptin maintenance completed 10/23/2013   04/01/2013 - 05/14/2013 Radiation Therapy   Adjuvant radiation therapy by Dr. Isidore Moos   05/28/2013 -  Anti-estrogen oral therapy   Letrozole 2.5 mg daily     CHIEF COMPLIANT: Follow-up on letrozole therapy  INTERVAL HISTORY: Angela Rosario is a 78 y.o. with above-mentioned history of right breast cancer treated with lumpectomy, adjuvant chemotherapy, and radiation who is currently on letrozole therapy. I last saw her a year ago. Mammogram on 12/12/17 showed no evidence of malignancy. She presents to the clinic today for annual follow-up.  She had a bone density test done at her primary care office.  We will try to obtain a copy of that record.  REVIEW OF SYSTEMS:   Constitutional: Denies fevers, chills or abnormal weight loss  Eyes: Denies blurriness of vision Ears, nose, mouth, throat, and face: Denies mucositis or sore throat Respiratory: Denies cough, dyspnea or wheezes Cardiovascular: Denies palpitation, chest discomfort Gastrointestinal: Denies nausea, heartburn or change in bowel habits Skin: Denies abnormal skin rashes Lymphatics: Denies new lymphadenopathy or easy bruising Neurological: Denies numbness, tingling or new weaknesses Behavioral/Psych: Mood is stable, no new changes  Extremities: No lower extremity edema Breast: denies any pain or lumps or nodules in either breasts All other systems were reviewed with the patient and are negative.  I have reviewed the past medical history, past surgical history, social history and family history with the patient and they are unchanged from previous note.  ALLERGIES:  is allergic to oxycodone.  MEDICATIONS:  Current Outpatient Medications  Medication Sig Dispense Refill  . JARDIANCE 25 MG TABS tablet 25 mg daily.     Marland Kitchen letrozole (FEMARA) 2.5 MG tablet TAKE 1 TABLET ONCE DAILY. 90 tablet 0  . losartan (COZAAR) 25 MG tablet TAKE 1 TABLET ONCE DAILY. 90 tablet 0  . lovastatin (MEVACOR) 10 MG tablet Take 10 mg by mouth at bedtime.     . Multiple Vitamin (MULTIVITAMIN) capsule Take 1 capsule by mouth daily.    . prednisoLONE acetate (PRED FORTE) 1 % ophthalmic suspension Place 1 drop into both eyes 2 (two) times daily. 5 mL 0  . sulfamethoxazole-trimethoprim (BACTRIM DS,SEPTRA DS) 800-160 MG tablet Take 1 tablet by mouth 2 (two) times daily.     No current facility-administered medications for this visit.  Facility-Administered Medications Ordered in Other Visits  Medication Dose Route Frequency Provider Last Rate Last Dose  . gentamicin (GARAMYCIN) 160 mg in dextrose 5 % 50 mL IVPB  160 mg Intravenous 30 min Pre-Op Franchot Gallo, MD        PHYSICAL EXAMINATION: ECOG PERFORMANCE STATUS: 1 - Symptomatic but completely ambulatory  Vitals:    09/11/18 1458  BP: 136/68  Pulse: 70  Resp: 17  Temp: 98.9 F (37.2 C)  SpO2: 99%   Filed Weights   09/11/18 1458  Weight: 171 lb 14.4 oz (78 kg)    GENERAL: alert, no distress and comfortable SKIN: skin color, texture, turgor are normal, no rashes or significant lesions EYES: normal, Conjunctiva are pink and non-injected, sclera clear OROPHARYNX: no exudate, no erythema and lips, buccal mucosa, and tongue normal  NECK: supple, thyroid normal size, non-tender, without nodularity LYMPH: no palpable lymphadenopathy in the cervical, axillary or inguinal LUNGS: clear to auscultation and percussion with normal breathing effort HEART: regular rate & rhythm and no murmurs and no lower extremity edema ABDOMEN: abdomen soft, non-tender and normal bowel sounds MUSCULOSKELETAL: no cyanosis of digits and no clubbing  NEURO: alert & oriented x 3 with fluent speech, no focal motor/sensory deficits EXTREMITIES: No lower extremity edema BREAST: No palpable masses or nodules in either right or left breasts. No palpable axillary supraclavicular or infraclavicular adenopathy no breast tenderness or nipple discharge. (exam performed in the presence of a chaperone)  LABORATORY DATA:  I have reviewed the data as listed CMP Latest Ref Rng & Units 02/27/2015 08/05/2014 01/27/2014  Glucose 70 - 140 mg/dl 203(H) 138 270(H)  BUN 7.0 - 26.0 mg/dL 15.4 17.2 13.7  Creatinine 0.6 - 1.1 mg/dL 0.9 0.8 0.9  Sodium 136 - 145 mEq/L 141 143 139  Potassium 3.5 - 5.1 mEq/L 4.4 4.2 4.1  Chloride 96 - 112 mEq/L - - -  CO2 22 - 29 mEq/L '26 29 25  ' Calcium 8.4 - 10.4 mg/dL 9.4 9.8 9.5  Total Protein 6.4 - 8.3 g/dL 6.5 6.4 6.0(L)  Total Bilirubin 0.20 - 1.20 mg/dL 0.91 0.94 0.43  Alkaline Phos 40 - 150 U/L 89 83 91  AST 5 - 34 U/L '14 17 12  ' ALT 0 - 55 U/L '16 17 16    ' Lab Results  Component Value Date   WBC 7.2 08/05/2014   HGB 14.1 08/05/2014   HCT 41.1 08/05/2014   MCV 90.9 08/05/2014   PLT 252 08/05/2014    NEUTROABS 4.7 08/05/2014    ASSESSMENT & PLAN:  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 Status post lumpectomy 10/04/2012 1.6 cm 0/5 lymph nodes T1 cN0 M0 stage IA status post adjuvant chemotherapy with [place] followed by Herceptin maintenance completed 10/23/2013, adjuvant radiation therapy was also complete and is now on adjuvant antiestrogen therapy with Femara started April 2015. Plan treatment duration 5 years  Aromatase inhibitor toxicities: vaginal dryness. Since patient completed 5 years of antiestrogen therapy, we can discontinue it at this time.  Breast cancer surveillance: 1. Breast exam7/20/2020benign 2. Mammogram10/29/2019 at [facility]. 3. Bone density April 2017: normal T score -0.8 in the spine: Normal She had another bone density in 2019.  Return to clinic in 1 year for follow-up to continue surveillance.  No orders of the defined types were placed in this encounter.  The patient has a good  understanding of the overall plan. she agrees with it. she will call with any problems that may develop before the next visit here.  Nicholas Lose, MD 09/11/2018  Julious Oka Dorshimer am acting as scribe for Dr. Nicholas Lose.  I have reviewed the above documentation for accuracy and completeness, and I agree with the above.

## 2018-09-11 ENCOUNTER — Inpatient Hospital Stay: Payer: Medicare Other | Attending: Hematology and Oncology | Admitting: Hematology and Oncology

## 2018-09-11 ENCOUNTER — Other Ambulatory Visit: Payer: Self-pay

## 2018-09-11 DIAGNOSIS — Z923 Personal history of irradiation: Secondary | ICD-10-CM | POA: Insufficient documentation

## 2018-09-11 DIAGNOSIS — Z9221 Personal history of antineoplastic chemotherapy: Secondary | ICD-10-CM | POA: Diagnosis not present

## 2018-09-11 DIAGNOSIS — Z79811 Long term (current) use of aromatase inhibitors: Secondary | ICD-10-CM | POA: Diagnosis not present

## 2018-09-11 DIAGNOSIS — C50111 Malignant neoplasm of central portion of right female breast: Secondary | ICD-10-CM | POA: Insufficient documentation

## 2018-09-11 DIAGNOSIS — Z170 Estrogen receptor positive status [ER+]: Secondary | ICD-10-CM | POA: Insufficient documentation

## 2018-09-11 DIAGNOSIS — Z885 Allergy status to narcotic agent status: Secondary | ICD-10-CM | POA: Insufficient documentation

## 2018-09-11 DIAGNOSIS — Z79899 Other long term (current) drug therapy: Secondary | ICD-10-CM | POA: Diagnosis not present

## 2018-09-26 DIAGNOSIS — H3552 Pigmentary retinal dystrophy: Secondary | ICD-10-CM | POA: Diagnosis not present

## 2018-09-26 DIAGNOSIS — H35373 Puckering of macula, bilateral: Secondary | ICD-10-CM | POA: Diagnosis not present

## 2018-09-26 DIAGNOSIS — H43811 Vitreous degeneration, right eye: Secondary | ICD-10-CM | POA: Diagnosis not present

## 2018-09-28 DIAGNOSIS — E119 Type 2 diabetes mellitus without complications: Secondary | ICD-10-CM | POA: Diagnosis not present

## 2018-10-16 DIAGNOSIS — Z85828 Personal history of other malignant neoplasm of skin: Secondary | ICD-10-CM | POA: Diagnosis not present

## 2018-10-16 DIAGNOSIS — D485 Neoplasm of uncertain behavior of skin: Secondary | ICD-10-CM | POA: Diagnosis not present

## 2018-10-24 DIAGNOSIS — E7800 Pure hypercholesterolemia, unspecified: Secondary | ICD-10-CM | POA: Diagnosis not present

## 2018-10-24 DIAGNOSIS — I110 Hypertensive heart disease with heart failure: Secondary | ICD-10-CM | POA: Diagnosis not present

## 2018-10-24 DIAGNOSIS — I509 Heart failure, unspecified: Secondary | ICD-10-CM | POA: Diagnosis not present

## 2018-10-24 DIAGNOSIS — E119 Type 2 diabetes mellitus without complications: Secondary | ICD-10-CM | POA: Diagnosis not present

## 2018-10-25 DIAGNOSIS — E119 Type 2 diabetes mellitus without complications: Secondary | ICD-10-CM | POA: Diagnosis not present

## 2018-10-25 DIAGNOSIS — I10 Essential (primary) hypertension: Secondary | ICD-10-CM | POA: Diagnosis not present

## 2018-11-01 DIAGNOSIS — E1351 Other specified diabetes mellitus with diabetic peripheral angiopathy without gangrene: Secondary | ICD-10-CM | POA: Diagnosis not present

## 2018-11-01 DIAGNOSIS — L602 Onychogryphosis: Secondary | ICD-10-CM | POA: Diagnosis not present

## 2018-11-21 DIAGNOSIS — D485 Neoplasm of uncertain behavior of skin: Secondary | ICD-10-CM | POA: Diagnosis not present

## 2018-11-21 DIAGNOSIS — Z85828 Personal history of other malignant neoplasm of skin: Secondary | ICD-10-CM | POA: Diagnosis not present

## 2018-11-21 DIAGNOSIS — C44311 Basal cell carcinoma of skin of nose: Secondary | ICD-10-CM | POA: Diagnosis not present

## 2018-11-29 DIAGNOSIS — M25552 Pain in left hip: Secondary | ICD-10-CM | POA: Diagnosis not present

## 2018-12-05 ENCOUNTER — Other Ambulatory Visit: Payer: Self-pay | Admitting: Physician Assistant

## 2018-12-05 DIAGNOSIS — M25552 Pain in left hip: Secondary | ICD-10-CM

## 2018-12-12 ENCOUNTER — Other Ambulatory Visit: Payer: Self-pay

## 2018-12-12 ENCOUNTER — Ambulatory Visit
Admission: RE | Admit: 2018-12-12 | Discharge: 2018-12-12 | Disposition: A | Discharge status: Home / Self Care | Payer: Medicare Other | Source: Ambulatory Visit | Attending: Physician Assistant | Admitting: Physician Assistant

## 2018-12-12 DIAGNOSIS — E119 Type 2 diabetes mellitus without complications: Secondary | ICD-10-CM | POA: Diagnosis not present

## 2018-12-12 DIAGNOSIS — M25552 Pain in left hip: Secondary | ICD-10-CM

## 2018-12-12 DIAGNOSIS — Z471 Aftercare following joint replacement surgery: Secondary | ICD-10-CM | POA: Diagnosis not present

## 2018-12-12 DIAGNOSIS — Z96642 Presence of left artificial hip joint: Secondary | ICD-10-CM | POA: Diagnosis not present

## 2018-12-17 DIAGNOSIS — Z20828 Contact with and (suspected) exposure to other viral communicable diseases: Secondary | ICD-10-CM | POA: Diagnosis not present

## 2019-01-01 DIAGNOSIS — E119 Type 2 diabetes mellitus without complications: Secondary | ICD-10-CM | POA: Diagnosis not present

## 2019-01-07 DIAGNOSIS — H35372 Puckering of macula, left eye: Secondary | ICD-10-CM | POA: Diagnosis not present

## 2019-01-07 DIAGNOSIS — H26493 Other secondary cataract, bilateral: Secondary | ICD-10-CM | POA: Diagnosis not present

## 2019-01-14 DIAGNOSIS — L602 Onychogryphosis: Secondary | ICD-10-CM | POA: Diagnosis not present

## 2019-01-14 DIAGNOSIS — E1351 Other specified diabetes mellitus with diabetic peripheral angiopathy without gangrene: Secondary | ICD-10-CM | POA: Diagnosis not present

## 2019-01-16 ENCOUNTER — Encounter: Payer: Self-pay | Admitting: Gastroenterology

## 2019-02-01 DIAGNOSIS — Z20828 Contact with and (suspected) exposure to other viral communicable diseases: Secondary | ICD-10-CM | POA: Diagnosis not present

## 2019-02-19 DIAGNOSIS — M170 Bilateral primary osteoarthritis of knee: Secondary | ICD-10-CM | POA: Diagnosis not present

## 2019-02-19 DIAGNOSIS — M1711 Unilateral primary osteoarthritis, right knee: Secondary | ICD-10-CM | POA: Diagnosis not present

## 2019-02-19 DIAGNOSIS — M25562 Pain in left knee: Secondary | ICD-10-CM | POA: Diagnosis not present

## 2019-02-19 DIAGNOSIS — M1712 Unilateral primary osteoarthritis, left knee: Secondary | ICD-10-CM | POA: Diagnosis not present

## 2019-02-20 DIAGNOSIS — M25562 Pain in left knee: Secondary | ICD-10-CM | POA: Diagnosis not present

## 2019-02-20 DIAGNOSIS — M25552 Pain in left hip: Secondary | ICD-10-CM | POA: Diagnosis not present

## 2019-02-20 DIAGNOSIS — M6281 Muscle weakness (generalized): Secondary | ICD-10-CM | POA: Diagnosis not present

## 2019-02-26 DIAGNOSIS — H26491 Other secondary cataract, right eye: Secondary | ICD-10-CM | POA: Diagnosis not present

## 2019-02-27 DIAGNOSIS — M6281 Muscle weakness (generalized): Secondary | ICD-10-CM | POA: Diagnosis not present

## 2019-02-27 DIAGNOSIS — M25552 Pain in left hip: Secondary | ICD-10-CM | POA: Diagnosis not present

## 2019-02-27 DIAGNOSIS — M25562 Pain in left knee: Secondary | ICD-10-CM | POA: Diagnosis not present

## 2019-02-28 DIAGNOSIS — Z85828 Personal history of other malignant neoplasm of skin: Secondary | ICD-10-CM | POA: Diagnosis not present

## 2019-02-28 DIAGNOSIS — L814 Other melanin hyperpigmentation: Secondary | ICD-10-CM | POA: Diagnosis not present

## 2019-02-28 DIAGNOSIS — D485 Neoplasm of uncertain behavior of skin: Secondary | ICD-10-CM | POA: Diagnosis not present

## 2019-02-28 DIAGNOSIS — C44619 Basal cell carcinoma of skin of left upper limb, including shoulder: Secondary | ICD-10-CM | POA: Diagnosis not present

## 2019-02-28 DIAGNOSIS — Z85820 Personal history of malignant melanoma of skin: Secondary | ICD-10-CM | POA: Diagnosis not present

## 2019-03-05 ENCOUNTER — Ambulatory Visit: Payer: Medicare Other | Attending: Internal Medicine

## 2019-03-05 ENCOUNTER — Other Ambulatory Visit: Payer: Self-pay

## 2019-03-05 DIAGNOSIS — Z23 Encounter for immunization: Secondary | ICD-10-CM | POA: Insufficient documentation

## 2019-03-05 NOTE — Progress Notes (Signed)
   Covid-19 Vaccination Clinic  Name:  Angela Rosario    MRN: [id number] DOB: [birth year]  03/05/2019  Angela Rosario was observed post Covid-19 immunization for 15 minutes without incidence. She was provided with Vaccine Information Sheet and instruction to access the V-Safe system.   Angela Rosario was instructed to call 911 with any severe reactions post vaccine: Marland Kitchen Difficulty breathing  . Swelling of your face and throat  . A fast heartbeat  . A bad rash all over your body  . Dizziness and weakness    Immunizations Administered    Name Date Dose VIS Date Route   Pfizer COVID-19 Vaccine 03/05/2019  8:55 AM 0.3 mL 01/25/2019 Intramuscular   Manufacturer: [place]   Lot: [id number]   [place]: [id number]

## 2019-03-06 DIAGNOSIS — H8113 Benign paroxysmal vertigo, bilateral: Secondary | ICD-10-CM | POA: Diagnosis not present

## 2019-03-06 DIAGNOSIS — M6281 Muscle weakness (generalized): Secondary | ICD-10-CM | POA: Diagnosis not present

## 2019-03-06 DIAGNOSIS — M25552 Pain in left hip: Secondary | ICD-10-CM | POA: Diagnosis not present

## 2019-03-06 DIAGNOSIS — M25562 Pain in left knee: Secondary | ICD-10-CM | POA: Diagnosis not present

## 2019-03-13 DIAGNOSIS — M6281 Muscle weakness (generalized): Secondary | ICD-10-CM | POA: Diagnosis not present

## 2019-03-13 DIAGNOSIS — H8113 Benign paroxysmal vertigo, bilateral: Secondary | ICD-10-CM | POA: Diagnosis not present

## 2019-03-13 DIAGNOSIS — M25562 Pain in left knee: Secondary | ICD-10-CM | POA: Diagnosis not present

## 2019-03-13 DIAGNOSIS — M25552 Pain in left hip: Secondary | ICD-10-CM | POA: Diagnosis not present

## 2019-03-19 DIAGNOSIS — C44619 Basal cell carcinoma of skin of left upper limb, including shoulder: Secondary | ICD-10-CM | POA: Diagnosis not present

## 2019-03-19 DIAGNOSIS — Z85828 Personal history of other malignant neoplasm of skin: Secondary | ICD-10-CM | POA: Diagnosis not present

## 2019-03-25 DIAGNOSIS — L602 Onychogryphosis: Secondary | ICD-10-CM | POA: Diagnosis not present

## 2019-03-25 DIAGNOSIS — E1351 Other specified diabetes mellitus with diabetic peripheral angiopathy without gangrene: Secondary | ICD-10-CM | POA: Diagnosis not present

## 2019-03-26 ENCOUNTER — Ambulatory Visit: Payer: Medicare Other | Attending: Internal Medicine

## 2019-03-26 DIAGNOSIS — Z23 Encounter for immunization: Secondary | ICD-10-CM | POA: Insufficient documentation

## 2019-03-26 NOTE — Progress Notes (Signed)
   Covid-19 Vaccination Clinic  Name:  Angela Rosario    MRN: [id number] DOB: Jul 08, 1940  03/26/2019  [name] was observed post Covid-19 immunization for 15 minutes without incidence. She was provided with Vaccine Information Sheet and instruction to access the V-Safe system.   [name] was instructed to call 911 with any severe reactions post vaccine: Marland Kitchen Difficulty breathing  . Swelling of your face and throat  . A fast heartbeat  . A bad rash all over your body  . Dizziness and weakness    Immunizations Administered    Name Date Dose VIS Date Route   Pfizer COVID-19 Vaccine 03/26/2019  9:30 AM 0.3 mL 01/25/2019 Intramuscular   Manufacturer: [place]   Lot: [id number]   [place]: [id number]

## 2019-04-03 DIAGNOSIS — E119 Type 2 diabetes mellitus without complications: Secondary | ICD-10-CM | POA: Diagnosis not present

## 2019-04-09 DIAGNOSIS — H26492 Other secondary cataract, left eye: Secondary | ICD-10-CM | POA: Diagnosis not present

## 2019-04-18 DIAGNOSIS — Z0120 Encounter for dental examination and cleaning without abnormal findings: Secondary | ICD-10-CM | POA: Diagnosis not present

## 2019-05-01 DIAGNOSIS — E119 Type 2 diabetes mellitus without complications: Secondary | ICD-10-CM | POA: Diagnosis not present

## 2019-05-01 DIAGNOSIS — Z0000 Encounter for general adult medical examination without abnormal findings: Secondary | ICD-10-CM | POA: Diagnosis not present

## 2019-05-01 DIAGNOSIS — E7800 Pure hypercholesterolemia, unspecified: Secondary | ICD-10-CM | POA: Diagnosis not present

## 2019-05-01 DIAGNOSIS — M810 Age-related osteoporosis without current pathological fracture: Secondary | ICD-10-CM | POA: Diagnosis not present

## 2019-05-02 DIAGNOSIS — M1712 Unilateral primary osteoarthritis, left knee: Secondary | ICD-10-CM | POA: Diagnosis not present

## 2019-05-09 DIAGNOSIS — Z0000 Encounter for general adult medical examination without abnormal findings: Secondary | ICD-10-CM | POA: Diagnosis not present

## 2019-05-09 DIAGNOSIS — I110 Hypertensive heart disease with heart failure: Secondary | ICD-10-CM | POA: Diagnosis not present

## 2019-05-09 DIAGNOSIS — I130 Hypertensive heart and chronic kidney disease with heart failure and stage 1 through stage 4 chronic kidney disease, or unspecified chronic kidney disease: Secondary | ICD-10-CM | POA: Diagnosis not present

## 2019-05-09 DIAGNOSIS — E1129 Type 2 diabetes mellitus with other diabetic kidney complication: Secondary | ICD-10-CM | POA: Diagnosis not present

## 2019-05-09 DIAGNOSIS — M1712 Unilateral primary osteoarthritis, left knee: Secondary | ICD-10-CM | POA: Diagnosis not present

## 2019-05-09 DIAGNOSIS — E1140 Type 2 diabetes mellitus with diabetic neuropathy, unspecified: Secondary | ICD-10-CM | POA: Diagnosis not present

## 2019-05-09 DIAGNOSIS — R82998 Other abnormal findings in urine: Secondary | ICD-10-CM | POA: Diagnosis not present

## 2019-05-15 DIAGNOSIS — Z1231 Encounter for screening mammogram for malignant neoplasm of breast: Secondary | ICD-10-CM | POA: Diagnosis not present

## 2019-05-15 DIAGNOSIS — Z1212 Encounter for screening for malignant neoplasm of rectum: Secondary | ICD-10-CM | POA: Diagnosis not present

## 2019-05-16 DIAGNOSIS — M1712 Unilateral primary osteoarthritis, left knee: Secondary | ICD-10-CM | POA: Diagnosis not present

## 2019-06-03 DIAGNOSIS — L602 Onychogryphosis: Secondary | ICD-10-CM | POA: Diagnosis not present

## 2019-06-03 DIAGNOSIS — L84 Corns and callosities: Secondary | ICD-10-CM | POA: Diagnosis not present

## 2019-06-03 DIAGNOSIS — E1351 Other specified diabetes mellitus with diabetic peripheral angiopathy without gangrene: Secondary | ICD-10-CM | POA: Diagnosis not present

## 2019-07-04 DIAGNOSIS — M1712 Unilateral primary osteoarthritis, left knee: Secondary | ICD-10-CM | POA: Diagnosis not present

## 2019-07-24 DIAGNOSIS — Z85828 Personal history of other malignant neoplasm of skin: Secondary | ICD-10-CM | POA: Diagnosis not present

## 2019-07-24 DIAGNOSIS — L570 Actinic keratosis: Secondary | ICD-10-CM | POA: Diagnosis not present

## 2019-08-20 DIAGNOSIS — Z0120 Encounter for dental examination and cleaning without abnormal findings: Secondary | ICD-10-CM | POA: Diagnosis not present

## 2019-08-20 DIAGNOSIS — L602 Onychogryphosis: Secondary | ICD-10-CM | POA: Diagnosis not present

## 2019-08-27 DIAGNOSIS — E7800 Pure hypercholesterolemia, unspecified: Secondary | ICD-10-CM | POA: Diagnosis not present

## 2019-08-27 DIAGNOSIS — E1140 Type 2 diabetes mellitus with diabetic neuropathy, unspecified: Secondary | ICD-10-CM | POA: Diagnosis not present

## 2019-08-27 DIAGNOSIS — I130 Hypertensive heart and chronic kidney disease with heart failure and stage 1 through stage 4 chronic kidney disease, or unspecified chronic kidney disease: Secondary | ICD-10-CM | POA: Diagnosis not present

## 2019-08-27 DIAGNOSIS — E1129 Type 2 diabetes mellitus with other diabetic kidney complication: Secondary | ICD-10-CM | POA: Diagnosis not present

## 2019-09-10 NOTE — Progress Notes (Signed)
Patient Care Team: Tisovec, Fransico Him, MD as PCP - General (Internal Medicine) Nahser, Wonda Cheng, MD as PCP - Cardiology (Cardiology)  DIAGNOSIS:    ICD-10-CM   1. Malignant neoplasm of central portion of right breast in female, estrogen receptor positive ([place])  C50.111    Z17.0     SUMMARY OF ONCOLOGIC HISTORY: Oncology History  Cancer of central portion of right female breast ([place])  09/11/2012 Mammogram   Right breast mass 7 mm at 12:00 position   09/12/2012 Initial Biopsy   Invasive mammary cancer with lobular features grade 2 ER 100%, PR percent, TSH some 45%, HER-2 positive ratio 2.96   09/18/2012 Breast MRI   Right breast mass 1.5 x 1.7 x 1.5 cm, no abnormal lymph nodes clinical stage TI cN0 M0 stage IA   10/04/2012 Surgery   Right breast lumpectomy 1.6 cm IDC grade 2 with associated intermediate grade DCIS tumor involving the dermis and lymphovascular invasion ER 100%, PR 100%, just some 45%, HER-2 positive, 0/5 lymph nodes   11/19/2012 - 10/23/2013 Chemotherapy   Adjuvant chemotherapy: [place] complicated by a fall 51/03/5850 fracture required surgery) resume chemotherapy 12/10/2012 completed [facility] 02/18/2013, Herceptin maintenance completed 10/23/2013   04/01/2013 - 05/14/2013 Radiation Therapy   Adjuvant radiation therapy by Dr. Isidore Moos   05/28/2013 -  Anti-estrogen oral therapy   Letrozole 2.5 mg daily     CHIEF COMPLIANT: Follow-up of right breast cancer on letrozole therapy  INTERVAL HISTORY: Angela Rosario is a 79 y.o. with above-mentioned history of right breast cancer treated with lumpectomy, adjuvant chemotherapy, and radiation who is currently on letrozole therapy. She presents to the clinic today for annual follow-up.   ALLERGIES:  is allergic to oxycodone.  MEDICATIONS:  Current Outpatient Medications  Medication Sig Dispense Refill  . JARDIANCE 25 MG TABS tablet 25 mg daily.     Marland Kitchen losartan (COZAAR) 25 MG tablet TAKE 1 TABLET ONCE DAILY. 90 tablet 0  .  lovastatin (MEVACOR) 10 MG tablet Take 10 mg by mouth at bedtime.     . Multiple Vitamin (MULTIVITAMIN) capsule Take 1 capsule by mouth daily.    . prednisoLONE acetate (PRED FORTE) 1 % ophthalmic suspension Place 1 drop into both eyes 2 (two) times daily. 5 mL 0   No current facility-administered medications for this visit.   Facility-Administered Medications Ordered in Other Visits  Medication Dose Route Frequency Provider Last Rate Last Admin  . gentamicin (GARAMYCIN) 160 mg in dextrose 5 % 50 mL IVPB  160 mg Intravenous 30 min Pre-Op Franchot Gallo, MD        PHYSICAL EXAMINATION: ECOG PERFORMANCE STATUS: 1 - Symptomatic but completely ambulatory  Vitals:   09/11/19 1045  BP: (!) 123/59  Pulse: 69  Resp: 16  Temp: 98.2 F (36.8 C)  SpO2: 99%   Filed Weights   09/11/19 1045  Weight: 168 lb 12.8 oz (76.6 kg)    BREAST: No palpable masses or nodules in either right or left breasts. No palpable axillary supraclavicular or infraclavicular adenopathy no breast tenderness or nipple discharge. (exam performed in the presence of a chaperone)  LABORATORY DATA:  I have reviewed the data as listed CMP Latest Ref Rng & Units 02/27/2015 08/05/2014 01/27/2014  Glucose 70 - 140 mg/dl 203(H) 138 270(H)  BUN 7.0 - 26.0 mg/dL 15.4 17.2 13.7  Creatinine 0.6 - 1.1 mg/dL 0.9 0.8 0.9  Sodium 136 - 145 mEq/L 141 143 139  Potassium 3.5 - 5.1 mEq/L 4.4 4.2 4.1  Chloride 96 - 112 mEq/L - - -  CO2 22 - 29 mEq/L _0 Calcium 8.4 - 10.4 mg/dL 9.4 9.8 9.5  Total Protein 6.4 - 8.3 g/dL 6.5 6.4 6.0(L)  Total Bilirubin 0.20 - 1.20 mg/dL 0.91 0.94 0.43  Alkaline Phos 40 - 150 U/L 89 83 91  AST 5 - 34 U/L _1 ALT 0 - 55 U/L _2 Lab Results  Component Value Date   WBC 7.2 08/05/2014   HGB 14.1 08/05/2014   HCT 41.1 08/05/2014   MCV 90.9 08/05/2014   PLT 252 08/05/2014   NEUTROABS 4.7 08/05/2014    ASSESSMENT & PLAN:  Cancer of central portion of right female  breast Right breast invasive ductal carcinoma: T1 N0, stage IA right breast invasive ductal carcinoma, grade II, ER 100%, PR 100%, Ki-67 45%, HER-2/neu positive. Status post lumpectomy 10/04/2012 1.6 cm 0/5 lymph nodes T1 cN0 M0 stage IA status post adjuvant chemotherapy with [place] followed by Herceptin maintenance completed 10/23/2013, adjuvant radiation therapy was also complete and is now on adjuvant antiestrogen therapy with Femara started April 2015.  Completed in April 2020  Breast cancer surveillance: 1. Breast examJuly 28, 2021benign [facility]. [facility] 3. Bone density April 03, 2018: normal T score -2.1 in the radius: Osteopenia, it appears that the hips and the spine have excellent bone density. Recommended calcium and vitamin D and weightbearing exercises  Patient will follow with her primary care physician for her annual checkups. Return to clinic on an as-needed basis   No orders of the defined types were placed in this encounter.  The patient has a good understanding of the overall plan. she agrees with it. she will call with any problems that may develop before the next visit here.  Total time spent: 20 mins including face to face time and time spent for planning, charting and coordination of care  Angela Lose, MD 09/11/2019  I, Angela Rosario, am acting as scribe for Dr. Nicholas Rosario.  I have reviewed the above documentation for accuracy and completeness, and I agree with the above.

## 2019-09-11 ENCOUNTER — Inpatient Hospital Stay: Payer: Medicare Other | Attending: Hematology and Oncology | Admitting: Hematology and Oncology

## 2019-09-11 ENCOUNTER — Other Ambulatory Visit: Payer: Self-pay

## 2019-09-11 DIAGNOSIS — Z923 Personal history of irradiation: Secondary | ICD-10-CM | POA: Diagnosis not present

## 2019-09-11 DIAGNOSIS — Z79899 Other long term (current) drug therapy: Secondary | ICD-10-CM | POA: Diagnosis not present

## 2019-09-11 DIAGNOSIS — Z170 Estrogen receptor positive status [ER+]: Secondary | ICD-10-CM | POA: Insufficient documentation

## 2019-09-11 DIAGNOSIS — Z9221 Personal history of antineoplastic chemotherapy: Secondary | ICD-10-CM | POA: Diagnosis not present

## 2019-09-11 DIAGNOSIS — M8589 Other specified disorders of bone density and structure, multiple sites: Secondary | ICD-10-CM | POA: Insufficient documentation

## 2019-09-11 DIAGNOSIS — Z79811 Long term (current) use of aromatase inhibitors: Secondary | ICD-10-CM | POA: Insufficient documentation

## 2019-09-11 DIAGNOSIS — C50111 Malignant neoplasm of central portion of right female breast: Secondary | ICD-10-CM

## 2019-09-11 MED ORDER — GLYXAMBI 25-5 MG PO TABS: 1.0000 | ORAL_TABLET | Freq: Every day | ORAL | Status: AC

## 2019-09-11 NOTE — Assessment & Plan Note (Signed)
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 Status post lumpectomy 10/04/2012 1.6 cm 0/5 lymph nodes T1 cN0 M0 stage IA status post adjuvant chemotherapy with [place] followed by Herceptin maintenance completed 10/23/2013, adjuvant radiation therapy was also complete and is now on adjuvant antiestrogen therapy with Femara started April 2015. Plan treatment duration5years  Aromatase inhibitor toxicities: vaginal dryness. Since patient completed 5 years of antiestrogen therapy, we can discontinue it at this time.  Breast cancer surveillance: 1. Breast examJuly 20, 2021benign 2. [facility] 3. Bone density April 03, 2018: normal T score -2.1 in the radius: Osteopenia, it appears that the hips and the spine have excellent bone density. Recommended calcium and vitamin D and weightbearing exercises  Return to clinic in 1 year for follow-up to continue surveillance

## 2019-09-12 ENCOUNTER — Telehealth: Payer: Self-pay | Admitting: Hematology and Oncology

## 2019-09-12 NOTE — Telephone Encounter (Signed)
No 7/28 los, no changes made to pt schedule

## 2019-10-02 DIAGNOSIS — H43821 Vitreomacular adhesion, right eye: Secondary | ICD-10-CM | POA: Diagnosis not present

## 2019-10-02 DIAGNOSIS — H43811 Vitreous degeneration, right eye: Secondary | ICD-10-CM | POA: Diagnosis not present

## 2019-10-02 DIAGNOSIS — H35373 Puckering of macula, bilateral: Secondary | ICD-10-CM | POA: Diagnosis not present

## 2019-10-02 DIAGNOSIS — H3552 Pigmentary retinal dystrophy: Secondary | ICD-10-CM | POA: Diagnosis not present

## 2019-10-24 DIAGNOSIS — Z0120 Encounter for dental examination and cleaning without abnormal findings: Secondary | ICD-10-CM | POA: Diagnosis not present

## 2019-11-19 DIAGNOSIS — M21612 Bunion of left foot: Secondary | ICD-10-CM | POA: Diagnosis not present

## 2019-11-19 DIAGNOSIS — M2042 Other hammer toe(s) (acquired), left foot: Secondary | ICD-10-CM | POA: Diagnosis not present

## 2019-11-19 DIAGNOSIS — M2041 Other hammer toe(s) (acquired), right foot: Secondary | ICD-10-CM | POA: Diagnosis not present

## 2019-11-19 DIAGNOSIS — E1351 Other specified diabetes mellitus with diabetic peripheral angiopathy without gangrene: Secondary | ICD-10-CM | POA: Diagnosis not present

## 2019-11-28 DIAGNOSIS — I739 Peripheral vascular disease, unspecified: Secondary | ICD-10-CM | POA: Diagnosis not present

## 2019-11-28 DIAGNOSIS — H35353 Cystoid macular degeneration, bilateral: Secondary | ICD-10-CM | POA: Diagnosis not present

## 2019-11-28 DIAGNOSIS — H3552 Pigmentary retinal dystrophy: Secondary | ICD-10-CM | POA: Diagnosis not present

## 2019-12-24 DIAGNOSIS — I130 Hypertensive heart and chronic kidney disease with heart failure and stage 1 through stage 4 chronic kidney disease, or unspecified chronic kidney disease: Secondary | ICD-10-CM | POA: Diagnosis not present

## 2019-12-24 DIAGNOSIS — E1140 Type 2 diabetes mellitus with diabetic neuropathy, unspecified: Secondary | ICD-10-CM | POA: Diagnosis not present

## 2019-12-24 DIAGNOSIS — E1129 Type 2 diabetes mellitus with other diabetic kidney complication: Secondary | ICD-10-CM | POA: Diagnosis not present

## 2019-12-24 DIAGNOSIS — N182 Chronic kidney disease, stage 2 (mild): Secondary | ICD-10-CM | POA: Diagnosis not present

## 2020-02-19 DIAGNOSIS — D485 Neoplasm of uncertain behavior of skin: Secondary | ICD-10-CM | POA: Diagnosis not present

## 2020-02-19 DIAGNOSIS — Z85828 Personal history of other malignant neoplasm of skin: Secondary | ICD-10-CM | POA: Diagnosis not present

## 2020-02-19 DIAGNOSIS — L570 Actinic keratosis: Secondary | ICD-10-CM | POA: Diagnosis not present

## 2020-02-19 DIAGNOSIS — L821 Other seborrheic keratosis: Secondary | ICD-10-CM | POA: Diagnosis not present

## 2020-02-19 DIAGNOSIS — C4441 Basal cell carcinoma of skin of scalp and neck: Secondary | ICD-10-CM | POA: Diagnosis not present

## 2020-03-26 DIAGNOSIS — I739 Peripheral vascular disease, unspecified: Secondary | ICD-10-CM | POA: Diagnosis not present

## 2020-03-26 DIAGNOSIS — L602 Onychogryphosis: Secondary | ICD-10-CM | POA: Diagnosis not present

## 2020-04-14 DIAGNOSIS — D485 Neoplasm of uncertain behavior of skin: Secondary | ICD-10-CM | POA: Diagnosis not present

## 2020-04-14 DIAGNOSIS — C4441 Basal cell carcinoma of skin of scalp and neck: Secondary | ICD-10-CM | POA: Diagnosis not present

## 2020-04-14 DIAGNOSIS — Z85828 Personal history of other malignant neoplasm of skin: Secondary | ICD-10-CM | POA: Diagnosis not present

## 2020-04-22 DIAGNOSIS — H40053 Ocular hypertension, bilateral: Secondary | ICD-10-CM | POA: Diagnosis not present

## 2020-04-22 DIAGNOSIS — Z961 Presence of intraocular lens: Secondary | ICD-10-CM | POA: Diagnosis not present

## 2020-05-05 DIAGNOSIS — M810 Age-related osteoporosis without current pathological fracture: Secondary | ICD-10-CM | POA: Diagnosis not present

## 2020-05-05 DIAGNOSIS — M1612 Unilateral primary osteoarthritis, left hip: Secondary | ICD-10-CM | POA: Diagnosis not present

## 2020-05-07 DIAGNOSIS — E7800 Pure hypercholesterolemia, unspecified: Secondary | ICD-10-CM | POA: Diagnosis not present

## 2020-05-07 DIAGNOSIS — N182 Chronic kidney disease, stage 2 (mild): Secondary | ICD-10-CM | POA: Diagnosis not present

## 2020-05-07 DIAGNOSIS — M810 Age-related osteoporosis without current pathological fracture: Secondary | ICD-10-CM | POA: Diagnosis not present

## 2020-05-07 DIAGNOSIS — I130 Hypertensive heart and chronic kidney disease with heart failure and stage 1 through stage 4 chronic kidney disease, or unspecified chronic kidney disease: Secondary | ICD-10-CM | POA: Diagnosis not present

## 2020-05-14 DIAGNOSIS — R82998 Other abnormal findings in urine: Secondary | ICD-10-CM | POA: Diagnosis not present

## 2020-05-14 DIAGNOSIS — E1129 Type 2 diabetes mellitus with other diabetic kidney complication: Secondary | ICD-10-CM | POA: Diagnosis not present

## 2020-05-14 DIAGNOSIS — Z1212 Encounter for screening for malignant neoplasm of rectum: Secondary | ICD-10-CM | POA: Diagnosis not present

## 2020-05-14 DIAGNOSIS — I130 Hypertensive heart and chronic kidney disease with heart failure and stage 1 through stage 4 chronic kidney disease, or unspecified chronic kidney disease: Secondary | ICD-10-CM | POA: Diagnosis not present

## 2020-05-14 DIAGNOSIS — Z0000 Encounter for general adult medical examination without abnormal findings: Secondary | ICD-10-CM | POA: Diagnosis not present

## 2020-05-14 DIAGNOSIS — E1140 Type 2 diabetes mellitus with diabetic neuropathy, unspecified: Secondary | ICD-10-CM | POA: Diagnosis not present

## 2020-05-20 DIAGNOSIS — H35353 Cystoid macular degeneration, bilateral: Secondary | ICD-10-CM | POA: Diagnosis not present

## 2020-06-29 DIAGNOSIS — Z1231 Encounter for screening mammogram for malignant neoplasm of breast: Secondary | ICD-10-CM | POA: Diagnosis not present

## 2020-07-02 DIAGNOSIS — E1351 Other specified diabetes mellitus with diabetic peripheral angiopathy without gangrene: Secondary | ICD-10-CM | POA: Diagnosis not present

## 2020-07-02 DIAGNOSIS — L602 Onychogryphosis: Secondary | ICD-10-CM | POA: Diagnosis not present

## 2020-07-03 DIAGNOSIS — Z85828 Personal history of other malignant neoplasm of skin: Secondary | ICD-10-CM | POA: Diagnosis not present

## 2020-07-03 DIAGNOSIS — D1801 Hemangioma of skin and subcutaneous tissue: Secondary | ICD-10-CM | POA: Diagnosis not present

## 2020-07-03 DIAGNOSIS — Z85820 Personal history of malignant melanoma of skin: Secondary | ICD-10-CM | POA: Diagnosis not present

## 2020-07-03 DIAGNOSIS — L814 Other melanin hyperpigmentation: Secondary | ICD-10-CM | POA: Diagnosis not present

## 2020-09-11 DIAGNOSIS — H35353 Cystoid macular degeneration, bilateral: Secondary | ICD-10-CM | POA: Diagnosis not present

## 2020-10-05 DIAGNOSIS — L602 Onychogryphosis: Secondary | ICD-10-CM | POA: Diagnosis not present

## 2020-10-05 DIAGNOSIS — I70293 Other atherosclerosis of native arteries of extremities, bilateral legs: Secondary | ICD-10-CM | POA: Diagnosis not present

## 2020-11-18 DIAGNOSIS — Z85828 Personal history of other malignant neoplasm of skin: Secondary | ICD-10-CM | POA: Diagnosis not present

## 2020-11-18 DIAGNOSIS — L820 Inflamed seborrheic keratosis: Secondary | ICD-10-CM | POA: Diagnosis not present

## 2020-11-18 DIAGNOSIS — L570 Actinic keratosis: Secondary | ICD-10-CM | POA: Diagnosis not present

## 2020-12-03 DIAGNOSIS — N182 Chronic kidney disease, stage 2 (mild): Secondary | ICD-10-CM | POA: Diagnosis not present

## 2020-12-03 DIAGNOSIS — Z23 Encounter for immunization: Secondary | ICD-10-CM | POA: Diagnosis not present

## 2020-12-03 DIAGNOSIS — E1140 Type 2 diabetes mellitus with diabetic neuropathy, unspecified: Secondary | ICD-10-CM | POA: Diagnosis not present

## 2020-12-03 DIAGNOSIS — I130 Hypertensive heart and chronic kidney disease with heart failure and stage 1 through stage 4 chronic kidney disease, or unspecified chronic kidney disease: Secondary | ICD-10-CM | POA: Diagnosis not present

## 2020-12-03 DIAGNOSIS — E1129 Type 2 diabetes mellitus with other diabetic kidney complication: Secondary | ICD-10-CM | POA: Diagnosis not present

## 2020-12-31 DIAGNOSIS — E1351 Other specified diabetes mellitus with diabetic peripheral angiopathy without gangrene: Secondary | ICD-10-CM | POA: Diagnosis not present

## 2020-12-31 DIAGNOSIS — M21612 Bunion of left foot: Secondary | ICD-10-CM | POA: Diagnosis not present

## 2020-12-31 DIAGNOSIS — B351 Tinea unguium: Secondary | ICD-10-CM | POA: Diagnosis not present

## 2020-12-31 DIAGNOSIS — M2041 Other hammer toe(s) (acquired), right foot: Secondary | ICD-10-CM | POA: Diagnosis not present

## 2021-01-18 DIAGNOSIS — H35353 Cystoid macular degeneration, bilateral: Secondary | ICD-10-CM | POA: Diagnosis not present

## 2021-05-24 DIAGNOSIS — E1140 Type 2 diabetes mellitus with diabetic neuropathy, unspecified: Secondary | ICD-10-CM | POA: Diagnosis not present

## 2021-05-24 DIAGNOSIS — I509 Heart failure, unspecified: Secondary | ICD-10-CM | POA: Diagnosis not present

## 2021-05-24 DIAGNOSIS — M810 Age-related osteoporosis without current pathological fracture: Secondary | ICD-10-CM | POA: Diagnosis not present

## 2021-05-24 DIAGNOSIS — E7800 Pure hypercholesterolemia, unspecified: Secondary | ICD-10-CM | POA: Diagnosis not present

## 2021-05-31 DIAGNOSIS — Z23 Encounter for immunization: Secondary | ICD-10-CM | POA: Diagnosis not present

## 2021-05-31 DIAGNOSIS — E1140 Type 2 diabetes mellitus with diabetic neuropathy, unspecified: Secondary | ICD-10-CM | POA: Diagnosis not present

## 2021-05-31 DIAGNOSIS — Z0000 Encounter for general adult medical examination without abnormal findings: Secondary | ICD-10-CM | POA: Diagnosis not present

## 2021-05-31 DIAGNOSIS — I130 Hypertensive heart and chronic kidney disease with heart failure and stage 1 through stage 4 chronic kidney disease, or unspecified chronic kidney disease: Secondary | ICD-10-CM | POA: Diagnosis not present

## 2021-05-31 DIAGNOSIS — Z1339 Encounter for screening examination for other mental health and behavioral disorders: Secondary | ICD-10-CM | POA: Diagnosis not present

## 2021-05-31 DIAGNOSIS — R82998 Other abnormal findings in urine: Secondary | ICD-10-CM | POA: Diagnosis not present

## 2021-05-31 DIAGNOSIS — Z1331 Encounter for screening for depression: Secondary | ICD-10-CM | POA: Diagnosis not present

## 2021-05-31 DIAGNOSIS — E1129 Type 2 diabetes mellitus with other diabetic kidney complication: Secondary | ICD-10-CM | POA: Diagnosis not present

## 2021-06-30 DIAGNOSIS — H35373 Puckering of macula, bilateral: Secondary | ICD-10-CM | POA: Diagnosis not present

## 2021-06-30 DIAGNOSIS — H43811 Vitreous degeneration, right eye: Secondary | ICD-10-CM | POA: Diagnosis not present

## 2021-06-30 DIAGNOSIS — H3552 Pigmentary retinal dystrophy: Secondary | ICD-10-CM | POA: Diagnosis not present

## 2021-06-30 DIAGNOSIS — H35033 Hypertensive retinopathy, bilateral: Secondary | ICD-10-CM | POA: Diagnosis not present

## 2021-07-05 DIAGNOSIS — Z1231 Encounter for screening mammogram for malignant neoplasm of breast: Secondary | ICD-10-CM | POA: Diagnosis not present

## 2021-08-31 DIAGNOSIS — M170 Bilateral primary osteoarthritis of knee: Secondary | ICD-10-CM | POA: Diagnosis not present

## 2021-08-31 DIAGNOSIS — M1712 Unilateral primary osteoarthritis, left knee: Secondary | ICD-10-CM | POA: Diagnosis not present

## 2021-09-28 DIAGNOSIS — I10 Essential (primary) hypertension: Secondary | ICD-10-CM | POA: Diagnosis not present

## 2021-09-28 DIAGNOSIS — Z87891 Personal history of nicotine dependence: Secondary | ICD-10-CM | POA: Diagnosis not present

## 2021-09-28 DIAGNOSIS — I208 Other forms of angina pectoris: Secondary | ICD-10-CM | POA: Diagnosis not present

## 2021-09-28 DIAGNOSIS — R6884 Jaw pain: Secondary | ICD-10-CM | POA: Diagnosis not present

## 2021-09-28 DIAGNOSIS — Z66 Do not resuscitate: Secondary | ICD-10-CM | POA: Diagnosis not present

## 2021-09-28 DIAGNOSIS — Z20822 Contact with and (suspected) exposure to covid-19: Secondary | ICD-10-CM | POA: Diagnosis not present

## 2021-09-28 DIAGNOSIS — C50919 Malignant neoplasm of unspecified site of unspecified female breast: Secondary | ICD-10-CM | POA: Diagnosis not present

## 2021-09-28 DIAGNOSIS — H3552 Pigmentary retinal dystrophy: Secondary | ICD-10-CM | POA: Diagnosis not present

## 2021-09-28 DIAGNOSIS — R079 Chest pain, unspecified: Secondary | ICD-10-CM | POA: Diagnosis not present

## 2021-09-28 DIAGNOSIS — Z9010 Acquired absence of unspecified breast and nipple: Secondary | ICD-10-CM | POA: Diagnosis not present

## 2021-09-28 DIAGNOSIS — Z7984 Long term (current) use of oral hypoglycemic drugs: Secondary | ICD-10-CM | POA: Diagnosis not present

## 2021-09-28 DIAGNOSIS — Z90710 Acquired absence of both cervix and uterus: Secondary | ICD-10-CM | POA: Diagnosis not present

## 2021-09-28 DIAGNOSIS — E785 Hyperlipidemia, unspecified: Secondary | ICD-10-CM | POA: Diagnosis not present

## 2021-09-28 DIAGNOSIS — E119 Type 2 diabetes mellitus without complications: Secondary | ICD-10-CM | POA: Diagnosis not present

## 2021-09-29 DIAGNOSIS — I499 Cardiac arrhythmia, unspecified: Secondary | ICD-10-CM | POA: Diagnosis not present

## 2021-09-29 DIAGNOSIS — I493 Ventricular premature depolarization: Secondary | ICD-10-CM | POA: Diagnosis not present

## 2021-09-29 DIAGNOSIS — E785 Hyperlipidemia, unspecified: Secondary | ICD-10-CM | POA: Diagnosis not present

## 2021-09-29 DIAGNOSIS — I208 Other forms of angina pectoris: Secondary | ICD-10-CM | POA: Diagnosis not present

## 2021-09-29 DIAGNOSIS — E119 Type 2 diabetes mellitus without complications: Secondary | ICD-10-CM | POA: Diagnosis not present

## 2021-09-29 DIAGNOSIS — R9431 Abnormal electrocardiogram [ECG] [EKG]: Secondary | ICD-10-CM | POA: Diagnosis not present

## 2021-09-29 DIAGNOSIS — I10 Essential (primary) hypertension: Secondary | ICD-10-CM | POA: Diagnosis not present

## 2021-10-07 DIAGNOSIS — M1712 Unilateral primary osteoarthritis, left knee: Secondary | ICD-10-CM | POA: Diagnosis not present

## 2021-10-14 ENCOUNTER — Other Ambulatory Visit: Payer: Self-pay | Admitting: Adult Health

## 2021-10-14 DIAGNOSIS — E7800 Pure hypercholesterolemia, unspecified: Secondary | ICD-10-CM

## 2021-10-14 DIAGNOSIS — I130 Hypertensive heart and chronic kidney disease with heart failure and stage 1 through stage 4 chronic kidney disease, or unspecified chronic kidney disease: Secondary | ICD-10-CM | POA: Diagnosis not present

## 2021-10-14 DIAGNOSIS — M1712 Unilateral primary osteoarthritis, left knee: Secondary | ICD-10-CM | POA: Diagnosis not present

## 2021-10-14 DIAGNOSIS — R0789 Other chest pain: Secondary | ICD-10-CM | POA: Diagnosis not present

## 2021-10-14 DIAGNOSIS — N182 Chronic kidney disease, stage 2 (mild): Secondary | ICD-10-CM | POA: Diagnosis not present

## 2021-10-15 DIAGNOSIS — H35373 Puckering of macula, bilateral: Secondary | ICD-10-CM | POA: Diagnosis not present

## 2021-10-15 DIAGNOSIS — Z961 Presence of intraocular lens: Secondary | ICD-10-CM | POA: Diagnosis not present

## 2021-10-15 DIAGNOSIS — H04123 Dry eye syndrome of bilateral lacrimal glands: Secondary | ICD-10-CM | POA: Diagnosis not present

## 2021-10-21 DIAGNOSIS — M1712 Unilateral primary osteoarthritis, left knee: Secondary | ICD-10-CM | POA: Diagnosis not present

## 2021-11-21 ENCOUNTER — Encounter: Payer: Self-pay | Admitting: Cardiovascular Disease

## 2021-11-21 NOTE — Progress Notes (Addendum)
Cardiology Office Note:    Date:  11/22/2021   ID:  Angela Rosario, Angela Rosario [birth year], MRN [id number]  PCP:  Haywood Pao, MD  Cardiologist:  Mertie Moores, MD  Electrophysiologist:  None   Referring MD: Reginold Agent, NP   Problem list 1.  Chronic diastolic congestive heart failure 2.  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  Sinus tachycardia 3.  Diabetes mellitus 4.  Hypertension 5.  Coronary artery disease  Chief Complaint  Patient presents with   Congestive Heart Failure             Seen with sister, Hoyle Sauer.  Angela Rosario is a 81 y.o. female with a hx of chronic diastolic congestive heart failure.  Last saw Angela Rosario in December, 2013.  Since that time she has been seen in the advanced heart failure clinic ( [facility] [place] [facility] for Herceptin therapy)  We were asked to see her today for a follow-up visit regarding a blackout episode that happened 4 months ago by  Dr. Osborne Casco.   She recently had a fall in the bathroom which prompted her visit today .  Was preceded by a brief black out episode  Woke up as soon as she hit the ground.   No severe injuries .  Hit her head.  No episodes since then Has developed vertigo   She walks a mile a day .Marland Kitchen  No CP or dyspnea or dizziness.  Has been started on Jardiance   Is clear from her breast cancer .   Oct. 9, 2023  Angela Rosario  is seen today for follow-up visit.  She was recently seen in [facility] in [place].  She apparently had CP and some arrhythmias.  Echocardiogram reveals normal left ventricular systolic function.  She has grade 1 diastolic dysfunction. Lexiscan myoview showed no ischemia The doctors there had recommended that she get a coronary calcium score which is already ordered.  Walks several blocks a day .   Walks her dog Encouraged her to walk several miles a day     Past Medical History:  Diagnosis Date   AC (acromioclavicular)  joint bone spurs    bone spurs in neck   Arthritis    oa   Breast cancer ([place])    Carcinoma of breast treated with adjuvant chemotherapy ([place])     Three weeks of Herceptin therapy that started on 11/19/2012 and adjuvant chemotherapy consisting of [place] started on 12/10/2012. She completed therapy [place] therapy on 02/18/13 and began adjuvant every 3 week Herceptin on 02/25/13.    Diabetes mellitus    niddm; last [id number] 6.2   Diastolic dysfunction    Episode of dizziness    Mild episodes   Hyperlipidemia    Hypertension    Orthostatic hypotension    Some episodes   Retinitis pigmentosa    poor peripheral vision both eyes   S/P radiation therapy  04/01/2013-05/14/2013   1) Right breast / 50 Gy in 25 fractions, 2) Right breast boost / 10 Gy in 5 fractions   Skin cancer    multiple sites (neck, nose, etc.)   Stress incontinence, female    wears pads   Syncope    Wears glasses     Past Surgical History:  Procedure Laterality Date   ABDOMINAL HYSTERECTOMY  1985   1 ovary removed   BREAST LUMPECTOMY WITH NEEDLE LOCALIZATION AND AXILLARY SENTINEL LYMPH NODE BX Right 10/04/2012   Procedure: RIGHT BREAST LUMPECTOMY  WITH NEEDLE LOCALIZATION AND AXILLARY SENTINEL LYMPH NODE BX;  Surgeon: [name], MD;  Location: [facility];  Service: General;  Laterality: Right;   CHOLECYSTECTOMY  1992   ORIF WRIST FRACTURE Left 11/06/2012   Procedure: LEFT OPEN REDUCTION INTERNAL FIXATION (ORIF) DISTAL RADIUS WRIST FRACTURE;  Surgeon: Tennis Must, MD;  Location: [facility];  Service: Orthopedics;  Laterality: Left;   OTHER SURGICAL HISTORY     Hysterectomy   PORTACATH PLACEMENT Left 10/04/2012   Procedure: INSERTION PORT-A-CATH;  Surgeon: [name], MD;  Location: [facility];  Service: General;  Laterality: Left;   PUBOVAGINAL SLING  02/03/2012   Procedure: Gaynelle Arabian;  Surgeon: Franchot Gallo, MD;  Location: [facility];   Service: Urology;  Laterality: N/A;  1 HR  LYNX SLING   TONSILLECTOMY     TOTAL HIP ARTHROPLASTY  02/23/2011   Procedure: TOTAL HIP ARTHROPLASTY;  Surgeon: Dione Plover Aluisio;  [facility]: [facility];  Service: Orthopedics;  Laterality: Left;    Current Medications: Current Meds  Medication Sig   Empagliflozin-linaGLIPtin (GLYXAMBI) 25-5 MG TABS Take 1 tablet by mouth daily.   losartan (COZAAR) 25 MG tablet TAKE 1 TABLET ONCE DAILY.   lovastatin (MEVACOR) 10 MG tablet Take 10 mg by mouth at bedtime.    Multiple Vitamin (MULTIVITAMIN) capsule Take 1 capsule by mouth daily.   prednisoLONE acetate (PRED FORTE) 1 % ophthalmic suspension Place 1 drop into both eyes 2 (two) times daily.     Allergies:   Oxycodone   Social History   Socioeconomic History   Marital status: Single    Spouse name: Not on file   Number of children: Not on file   Years of education: Not on file   Highest education level: Not on file  Occupational History   Not on file  Tobacco Use   Smoking status: Former    Packs/day: 0.25    Years: 1.00    Total pack years: 0.25    Types: Cigarettes    Quit date: 02/14/1957    Years since quitting: 64.8   Smokeless tobacco: Never  Vaping Use   Vaping Use: Never used  Substance and Sexual Activity   Alcohol use: No   Drug use: No   Sexual activity: Not Currently    Birth control/protection: Surgical  Other Topics Concern   Not on file  Social History Narrative   Divorced, lives alone with her poodle   Social Determinants of Health   Financial Resource Strain: Not on file  Food Insecurity: Not on file  Transportation Needs: Not on file  Physical Activity: Not on file  Stress: Not on file  Social Connections: Not on file     Family History: The patient's family history includes Breast cancer in her paternal aunt; Breast cancer (age of onset: 36) in her paternal uncle; Lung cancer in her maternal uncle; Prostate cancer in her cousin.  ROS:   Please see the  history of present illness.     All other systems reviewed and are negative.  EKGs/Labs/Other Studies Reviewed:    The following studies were reviewed today:   Recent Labs: No results found for requested labs within last 365 days.  Recent Lipid Panel No results found for: "CHOL", "TRIG", "HDL", "CHOLHDL", "VLDL", "LDLCALC", "LDLDIRECT"  Physical Exam:     Physical Exam: Blood pressure 114/62, pulse 92, height '5\' 6"'$  (1.676 m), weight 165 lb (74.8 kg), SpO2 94 %.  GEN:  Well nourished, well developed in no acute distress HEENT: Normal NECK: No JVD; No carotid bruits LYMPHATICS: No lymphadenopathy CARDIAC: RRR , no murmurs, rubs, gallops RESPIRATORY:  Clear to auscultation without rales, wheezing or rhonchi  ABDOMEN: Soft, non-tender, non-distended MUSCULOSKELETAL:  No edema; No deformity  SKIN: Warm and dry NEUROLOGIC:  Alert and oriented x 3  ECG: November 22, 2021: Normal sinus rhythm at 92.  First-degree AV block.  Occasional premature ventricular contraction.  Incomplete right bundle branch block.  ASSESSMENT:    1. Syncope and collapse   2. Essential hypertension   3. Chest pain of uncertain etiology     PLAN:      Syncope:   no further episodes of syncope   CP:  work up at [facility] was negative.   Myoview is low risk Coronary calcium score is pending      Medication Adjustments/Labs and Tests Ordered: Current medicines are reviewed at length with the patient today.  Concerns regarding medicines are outlined above.  Orders Placed This Encounter  Procedures   EKG 12-Lead   No orders of the defined types were placed in this encounter.   Patient Instructions  Medication Instructions:  NO CHANGES *If you need a refill on your cardiac medications before your next appointment, please call your pharmacy*   Lab Work: NONE If you have labs (blood work) drawn today and your tests are completely normal, you will receive your results only  by: [place] (if you have MyChart) OR A paper copy in the mail If you have any lab test that is abnormal or we need to change your treatment, we will call you to review the results.   Testing/Procedures: NONE   Follow-Up: At [facility], you and your health needs are our priority.  As part of our continuing mission to provide you with exceptional heart care, we have created designated Provider Care Teams.  These Care Teams include your primary Cardiologist (physician) and Advanced Practice Providers (APPs -  Physician Assistants and Nurse Practitioners) who all work together to provide you with the care you need, when you need it.  We recommend signing up for the patient portal called "MyChart".  Sign up information is provided on this After Visit Summary.  MyChart is used to connect with patients for Virtual Visits (Telemedicine).  Patients are able to view lab/test results, encounter notes, upcoming appointments, etc.  Non-urgent messages can be sent to your provider as well.   To learn more about what you can do with MyChart, go to [email or web address].    Your next appointment:   6 month(s)  The format for your next appointment:   In Person  Provider:   Mertie Moores, MD     Other Instructions NONE  Important Information About Sugar         Signed, Mertie Moores, MD  11/22/2021 3:16 PM    [place]

## 2021-11-22 ENCOUNTER — Encounter: Payer: Self-pay | Admitting: Cardiovascular Disease

## 2021-11-22 ENCOUNTER — Ambulatory Visit: Payer: Medicare Other | Attending: Cardiovascular Disease | Admitting: Cardiovascular Disease

## 2021-11-22 VITALS — BP 114/62 | HR 92 | Ht 66.0 in | Wt 165.0 lb

## 2021-11-22 DIAGNOSIS — I10 Essential (primary) hypertension: Secondary | ICD-10-CM | POA: Diagnosis not present

## 2021-11-22 DIAGNOSIS — R079 Chest pain, unspecified: Secondary | ICD-10-CM | POA: Diagnosis not present

## 2021-11-22 DIAGNOSIS — R55 Syncope and collapse: Secondary | ICD-10-CM

## 2021-11-22 NOTE — Patient Instructions (Signed)
Medication Instructions:  NO CHANGES *If you need a refill on your cardiac medications before your next appointment, please call your pharmacy*   Lab Work: NONE If you have labs (blood work) drawn today and your tests are completely normal, you will receive your results only by: MyChart Message (if you have MyChart) OR A paper copy in the mail If you have any lab test that is abnormal or we need to change your treatment, we will call you to review the results.   Testing/Procedures: NONE   Follow-Up: At [facility], you and your health needs are our priority.  As part of our continuing mission to provide you with exceptional heart care, we have created designated Provider Care Teams.  These Care Teams include your primary Cardiologist (physician) and Advanced Practice Providers (APPs -  Physician Assistants and Nurse Practitioners) who all work together to provide you with the care you need, when you need it.  We recommend signing up for the patient portal called "MyChart".  Sign up information is provided on this After Visit Summary.  MyChart is used to connect with patients for Virtual Visits (Telemedicine).  Patients are able to view lab/test results, encounter notes, upcoming appointments, etc.  Non-urgent messages can be sent to your provider as well.   To learn more about what you can do with MyChart, go to [email or web address].    Your next appointment:   6 month(s)  The format for your next appointment:   In Person  Provider:   Philip Nahser, MD     Other Instructions NONE  Important Information About Sugar       

## 2021-11-27 DIAGNOSIS — Z23 Encounter for immunization: Secondary | ICD-10-CM | POA: Diagnosis not present

## 2021-11-29 DIAGNOSIS — L570 Actinic keratosis: Secondary | ICD-10-CM | POA: Diagnosis not present

## 2021-11-29 DIAGNOSIS — Z85828 Personal history of other malignant neoplasm of skin: Secondary | ICD-10-CM | POA: Diagnosis not present

## 2021-11-29 DIAGNOSIS — L821 Other seborrheic keratosis: Secondary | ICD-10-CM | POA: Diagnosis not present

## 2021-11-29 DIAGNOSIS — D485 Neoplasm of uncertain behavior of skin: Secondary | ICD-10-CM | POA: Diagnosis not present

## 2021-11-29 DIAGNOSIS — C44619 Basal cell carcinoma of skin of left upper limb, including shoulder: Secondary | ICD-10-CM | POA: Diagnosis not present

## 2021-12-13 DIAGNOSIS — E1140 Type 2 diabetes mellitus with diabetic neuropathy, unspecified: Secondary | ICD-10-CM | POA: Diagnosis not present

## 2021-12-13 DIAGNOSIS — I130 Hypertensive heart and chronic kidney disease with heart failure and stage 1 through stage 4 chronic kidney disease, or unspecified chronic kidney disease: Secondary | ICD-10-CM | POA: Diagnosis not present

## 2021-12-13 DIAGNOSIS — E1129 Type 2 diabetes mellitus with other diabetic kidney complication: Secondary | ICD-10-CM | POA: Diagnosis not present

## 2021-12-13 DIAGNOSIS — N182 Chronic kidney disease, stage 2 (mild): Secondary | ICD-10-CM | POA: Diagnosis not present

## 2022-01-05 ENCOUNTER — Other Ambulatory Visit: Payer: Medicare Other

## 2022-01-10 DIAGNOSIS — M2042 Other hammer toe(s) (acquired), left foot: Secondary | ICD-10-CM | POA: Diagnosis not present

## 2022-01-10 DIAGNOSIS — M21611 Bunion of right foot: Secondary | ICD-10-CM | POA: Diagnosis not present

## 2022-01-10 DIAGNOSIS — M2041 Other hammer toe(s) (acquired), right foot: Secondary | ICD-10-CM | POA: Diagnosis not present

## 2022-01-10 DIAGNOSIS — M21612 Bunion of left foot: Secondary | ICD-10-CM | POA: Diagnosis not present

## 2022-01-20 ENCOUNTER — Other Ambulatory Visit: Payer: Self-pay

## 2022-02-24 ENCOUNTER — Other Ambulatory Visit: Payer: Self-pay

## 2022-04-07 ENCOUNTER — Ambulatory Visit
Admission: RE | Admit: 2022-04-07 | Discharge: 2022-04-07 | Disposition: A | Discharge status: Home / Self Care | Payer: No Typology Code available for payment source | Source: Ambulatory Visit | Attending: Adult Health | Admitting: Adult Health

## 2022-04-07 DIAGNOSIS — E7800 Pure hypercholesterolemia, unspecified: Secondary | ICD-10-CM

## 2022-04-18 ENCOUNTER — Telehealth: Payer: Self-pay | Admitting: Cardiovascular Disease

## 2022-04-18 DIAGNOSIS — Z79899 Other long term (current) drug therapy: Secondary | ICD-10-CM

## 2022-04-18 MED ORDER — ATORVASTATIN CALCIUM 40 MG PO TABS: 40.0000 mg | ORAL_TABLET | Freq: Every day | ORAL | 3 refills | Status: DC

## 2022-04-18 NOTE — Telephone Encounter (Signed)
Called and spoke with patient who agrees to plan. Will stop Lovastatin and begin Atorvastatin '40mg'$  daily. 3 month appt with Dr Acie Fredrickson scheduled, along with repeat labs, for 08/03/22.

## 2022-04-18 NOTE — Telephone Encounter (Signed)
-----   Message from Thayer Headings, MD sent at 04/16/2022 11:36 AM EST ----- CAC score ordered by Dr. Osborne Casco is 216.   This places her in the 60th percentile for age / sex matched controls  She is on Chiropractor.   I would like for her to be on a stronger statin  Please DC Mevacor. Start atorvastatin 40 mg a day  Ckeck lipids , ALT in 3 months .   Along with a follow up appt with me in 3 months .

## 2022-06-02 DIAGNOSIS — Z961 Presence of intraocular lens: Secondary | ICD-10-CM | POA: Diagnosis not present

## 2022-06-02 DIAGNOSIS — H35353 Cystoid macular degeneration, bilateral: Secondary | ICD-10-CM | POA: Diagnosis not present

## 2022-06-02 DIAGNOSIS — H3552 Pigmentary retinal dystrophy: Secondary | ICD-10-CM | POA: Diagnosis not present

## 2022-06-02 DIAGNOSIS — H04123 Dry eye syndrome of bilateral lacrimal glands: Secondary | ICD-10-CM | POA: Diagnosis not present

## 2022-06-06 DIAGNOSIS — E1129 Type 2 diabetes mellitus with other diabetic kidney complication: Secondary | ICD-10-CM | POA: Diagnosis not present

## 2022-06-06 DIAGNOSIS — E7800 Pure hypercholesterolemia, unspecified: Secondary | ICD-10-CM | POA: Diagnosis not present

## 2022-06-06 DIAGNOSIS — M810 Age-related osteoporosis without current pathological fracture: Secondary | ICD-10-CM | POA: Diagnosis not present

## 2022-06-06 DIAGNOSIS — R7989 Other specified abnormal findings of blood chemistry: Secondary | ICD-10-CM | POA: Diagnosis not present

## 2022-06-06 LAB — LAB REPORT - SCANNED
A1c: 7.1
EGFR: 68.7

## 2022-06-13 DIAGNOSIS — Z1339 Encounter for screening examination for other mental health and behavioral disorders: Secondary | ICD-10-CM | POA: Diagnosis not present

## 2022-06-13 DIAGNOSIS — Z1331 Encounter for screening for depression: Secondary | ICD-10-CM | POA: Diagnosis not present

## 2022-06-13 DIAGNOSIS — E1140 Type 2 diabetes mellitus with diabetic neuropathy, unspecified: Secondary | ICD-10-CM | POA: Diagnosis not present

## 2022-06-13 DIAGNOSIS — E1129 Type 2 diabetes mellitus with other diabetic kidney complication: Secondary | ICD-10-CM | POA: Diagnosis not present

## 2022-06-13 DIAGNOSIS — I5032 Chronic diastolic (congestive) heart failure: Secondary | ICD-10-CM | POA: Diagnosis not present

## 2022-06-13 DIAGNOSIS — I130 Hypertensive heart and chronic kidney disease with heart failure and stage 1 through stage 4 chronic kidney disease, or unspecified chronic kidney disease: Secondary | ICD-10-CM | POA: Diagnosis not present

## 2022-06-13 DIAGNOSIS — Z0000 Encounter for general adult medical examination without abnormal findings: Secondary | ICD-10-CM | POA: Diagnosis not present

## 2022-07-18 DIAGNOSIS — Z1231 Encounter for screening mammogram for malignant neoplasm of breast: Secondary | ICD-10-CM | POA: Diagnosis not present

## 2022-07-21 DIAGNOSIS — M810 Age-related osteoporosis without current pathological fracture: Secondary | ICD-10-CM | POA: Diagnosis not present

## 2022-08-02 ENCOUNTER — Encounter: Payer: Self-pay | Admitting: Cardiovascular Disease

## 2022-08-02 NOTE — Progress Notes (Unsigned)
Cardiology Office Note:    Date:  08/03/2022   ID:  Angela SCAGNELLI, DOB [birth year], MRN [id number]  PCP:  [name], MD  Cardiologist:  Kristeen Miss, MD  Electrophysiologist:  None   Referring MD: [name], MD   Problem list 1.  Chronic diastolic congestive heart failure 2.  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  Sinus tachycardia 3.  Diabetes mellitus 4.  Hypertension 5.  Coronary artery disease  Chief Complaint  Patient presents with   Loss of Consciousness             Seen with sister, Eber Jones.  Angela Rosario is a 82 y.o. female with a hx of chronic diastolic congestive heart failure.  Last saw [name] in December, 2013.  Since that time she has been seen in the advanced heart failure clinic ( [facility] - [facility]cology clinic for Herceptin therapy)  We were asked to see her today for a follow-up visit regarding a blackout episode that happened 4 months ago by  Dr. Wylene Simmer.   She recently had a fall in the bathroom which prompted her visit today .  Was preceded by a brief black out episode  Woke up as soon as she hit the ground.   No severe injuries .  Hit her head.  No episodes since then Has developed vertigo   She walks a mile a day .Marland Kitchen  No CP or dyspnea or dizziness.  Has been started on Jardiance   Is clear from her breast cancer .   Oct. 9, 2023  Cohen  is seen today for follow-up visit.  She was recently seen in [facility] in [place].  She apparently had CP and some arrhythmias.  Echocardiogram reveals normal left ventricular systolic function.  She has grade 1 diastolic dysfunction. Lexiscan myoview showed no ischemia The doctors there had recommended that she get a coronary calcium score which is already ordered.  Walks several blocks a day .   Walks her dog Encouraged her to walk several miles a day    August 03, 2022: Seen with sister, Angela Rosario is seen for  follow-up of her syncope/presyncopal episodes.  She is also had some episodes of chest discomfort.  She has grade 1 diastolic dysfunction. No syncope or presyncope Walks 1 mile each day  We recommended 2 miles   Coronary calcium score from February, 2024 reveals calcium score of 216 which places her in the 60th percentile for age and sex matched controls.  She had labs drawn by Dr. Wylene Simmer in April, 2024. Her total cholesterol was 106 Triglyceride levels 116 HDL is 34 LDL was 49. She reduced her atorvastatin to 20 mg due to size effects ( ? Body aches )   Given her coronary calcifications I recommended that she try to achieve an LDL of less than 70.  Since her previous LDL was 49 but since that time she has reduced her atorvastatin to 20 mg a day, we will recheck her lipids today.  If her LDL is still less than 70 then we will stay with the current dose of atorvastatin.  If her LDL is above 70 then we will consider adding Zetia 10 mg a day      Past Medical History:  Diagnosis Date   AC (acromioclavicular) joint bone spurs    bone spurs in neck   Arthritis    oa   Breast cancer ([facility])  Carcinoma of breast treated with adjuvant chemotherapy ([facility])     Three weeks of Herceptin therapy that started on 11/19/2012 and adjuvant chemotherapy consisting of T[place] started on 12/10/2012. She completed therapy TCH therapy on 02/18/13 and began adjuvant every 3 week Herceptin on 02/25/13.    Diabetes mellitus    niddm; last A1C 6.2   Diastolic dysfunction    Episode of dizziness    Mild episodes   Hyperlipidemia    Hypertension    Orthostatic hypotension    Some episodes   Retinitis pigmentosa    poor peripheral vision both eyes   S/P radiation therapy  04/01/2013-05/14/2013   1) Right breast / 50 Gy in 25 fractions, 2) Right breast boost / 10 Gy in 5 fractions   Skin cancer    multiple sites (neck, nose, etc.)   Stress incontinence, female    wears pads   Syncope    Wears glasses      Past Surgical History:  Procedure Laterality Date   ABDOMINAL HYSTERECTOMY  1985   1 ovary removed   BREAST LUMPECTOMY WITH NEEDLE LOCALIZATION AND AXILLARY SENTINEL LYMPH NODE BX Right 10/04/2012   Procedure: RIGHT BREAST LUMPECTOMY WITH NEEDLE LOCALIZATION AND AXILLARY SENTINEL LYMPH NODE BX;  Surgeon: Emelia Loron, MD;  Location: [facility];  Service: General;  Laterality: Right;   CHOLECYSTECTOMY  1992   ORIF WRIST FRACTURE Left 11/06/2012   Procedure: LEFT OPEN REDUCTION INTERNAL FIXATION (ORIF) DISTAL RADIUS WRIST FRACTURE;  Surgeon: Tami Ribas, MD;  Location: [facility];  Service: Orthopedics;  Laterality: Left;   OTHER SURGICAL HISTORY     Hysterectomy   PORTACATH PLACEMENT Left 10/04/2012   Procedure: INSERTION PORT-A-CATH;  Surgeon: Emelia Loron, MD;  Location: [facility];  Service: General;  Laterality: Left;   PUBOVAGINAL SLING  02/03/2012   Procedure: Leonides Grills;  Surgeon: Marcine Matar, MD;  Location: [facility];  Service: Urology;  Laterality: N/A;  1 HR  LYNX SLING   TONSILLECTOMY     TOTAL HIP ARTHROPLASTY  02/23/2011   Procedure: TOTAL HIP ARTHROPLASTY;  Surgeon: Gus Rankin Aluisio;  [facility];  Service: Orthopedics;  Laterality: Left;    Current Medications: Current Meds  Medication Sig   atorvastatin (LIPITOR) 40 MG tablet Take 1 tablet (40 mg total) by mouth daily. (Patient taking differently: Take 20 mg by mouth daily.)   Empagliflozin-linaGLIPtin (GLYXAMBI) 25-5 MG TABS Take 1 tablet by mouth daily.   losartan (COZAAR) 25 MG tablet TAKE 1 TABLET ONCE DAILY.   Multiple Vitamin (MULTIVITAMIN) capsule Take 1 capsule by mouth daily.   prednisoLONE acetate (PRED FORTE) 1 % ophthalmic suspension Place 1 drop into both eyes 2 (two) times daily.     Allergies:   Oxycodone   Social History   Socioeconomic History   Marital status: Single    Spouse name: Not on file   Number  of children: Not on file   Years of education: Not on file   Highest education level: Not on file  Occupational History   Not on file  Tobacco Use   Smoking status: Former    Packs/day: 0.25    Years: 1.00    Additional pack years: 0.00    Total pack years: 0.25    Types: Cigarettes    Quit date: 02/14/1957    Years since quitting: 65.5   Smokeless tobacco: Never  Vaping Use   Vaping Use: Never used  Substance and Sexual  Activity   Alcohol use: No   Drug use: No   Sexual activity: Not Currently    Birth control/protection: Surgical  Other Topics Concern   Not on file  Social History Narrative   Divorced, lives alone with her poodle   Social Determinants of Health   Financial Resource Strain: Not on file  Food Insecurity: Not on file  Transportation Needs: Not on file  Physical Activity: Not on file  Stress: Not on file  Social Connections: Not on file     Family History: The patient's family history includes Breast cancer in her paternal aunt; Breast cancer (age of onset: 53) in her paternal uncle; Lung cancer in her maternal uncle; Prostate cancer in her cousin.  ROS:   Please see the history of present illness.     All other systems reviewed and are negative.  EKGs/Labs/Other Studies Reviewed:    The following studies were reviewed today:   Recent Labs: No results found for requested labs within last 365 days.  Recent Lipid Panel No results found for: "CHOL", "TRIG", "HDL", "CHOLHDL", "VLDL", "LDLCALC", "LDLDIRECT"  Physical Exam:     Physical Exam: Blood pressure 134/72, pulse 89, height 5\' 6"  (1.676 m), weight 161 lb (73 kg), SpO2 96 %.       GEN:  Well nourished, well developed in no acute distress HEENT: Normal NECK: No JVD; No carotid bruits LYMPHATICS: No lymphadenopathy CARDIAC: RRR , no murmurs, rubs, gallops RESPIRATORY:  Clear to auscultation without rales, wheezing or rhonchi  ABDOMEN: Soft, non-tender, non-distended MUSCULOSKELETAL:   No edema; No deformity  SKIN: Warm and dry NEUROLOGIC:  Alert and oriented x 3   ECG:    ASSESSMENT:    1. Medication management   2. Mixed hyperlipidemia      PLAN:      Syncope:        3.   Coronary artery calcification  CAC score is 216.  60th percentile for age / sex matched controls  She had labs drawn by Dr. Wylene Simmer in April, 2024. Her total cholesterol was 106 Triglyceride levels 116 HDL is 34 LDL was 49. She reduced her atorvastatin to 20 mg due to size effects ( ? Body aches )   Given her coronary calcifications I recommended that she try to achieve an LDL of less than 70.  Since her previous LDL was 49 but since that time she has reduced her atorvastatin to 20 mg a day, we will recheck her lipids today.  If her LDL is still less than 70 then we will stay with the current dose of atorvastatin.  If her LDL is above 70 then we will consider adding Zetia 10 mg a day   Medication Adjustments/Labs and Tests Ordered: Current medicines are reviewed at length with the patient today.  Concerns regarding medicines are outlined above.  Orders Placed This Encounter  Procedures   Lipid panel   ALT   No orders of the defined types were placed in this encounter.     Patient Instructions  Medication Instructions:  Your physician recommends that you continue on your current medications as directed. Please refer to the Current Medication list given to you today.  *If you need a refill on your cardiac medications before your next appointment, please call your pharmacy*   Lab Work: Lipids, ALT today If you have labs (blood work) drawn today and your tests are completely normal, you will receive your results only by: MyChart Message (if you have MyChart)  OR A paper copy in the mail If you have any lab test that is abnormal or we need to change your treatment, we will call you to review the results.   Testing/Procedures: NONE   Follow-Up: At [facility],  you and your health needs are our priority.  As part of our continuing mission to provide you with exceptional heart care, we have created designated Provider Care Teams.  These Care Teams include your primary Cardiologist (physician) and Advanced Practice Providers (APPs -  Physician Assistants and Nurse Practitioners) who all work together to provide you with the care you need, when you need it.  We recommend signing up for the patient portal called "MyChart".  Sign up information is provided on this After Visit Summary.  MyChart is used to connect with patients for Virtual Visits (Telemedicine).  Patients are able to view lab/test results, encounter notes, upcoming appointments, etc.  Non-urgent messages can be sent to your provider as well.   To learn more about what you can do with MyChart, go to [email or web address].    Your next appointment:   1 year(s)  Provider:   Kristeen Miss, MD        Signed, Kristeen Miss, MD  08/03/2022 3:28 PM    [facility]

## 2022-08-03 ENCOUNTER — Ambulatory Visit: Payer: Medicare Other

## 2022-08-03 ENCOUNTER — Ambulatory Visit: Payer: Medicare Other | Attending: Cardiovascular Disease | Admitting: Cardiovascular Disease

## 2022-08-03 ENCOUNTER — Encounter: Payer: Self-pay | Admitting: Cardiovascular Disease

## 2022-08-03 VITALS — BP 134/72 | HR 89 | Ht 66.0 in | Wt 161.0 lb

## 2022-08-03 DIAGNOSIS — Z79899 Other long term (current) drug therapy: Secondary | ICD-10-CM | POA: Diagnosis not present

## 2022-08-03 DIAGNOSIS — E782 Mixed hyperlipidemia: Secondary | ICD-10-CM

## 2022-08-03 NOTE — Patient Instructions (Signed)
Medication Instructions:  Your physician recommends that you continue on your current medications as directed. Please refer to the Current Medication list given to you today.  *If you need a refill on your cardiac medications before your next appointment, please call your pharmacy*   Lab Work: Lipids, ALT today If you have labs (blood work) drawn today and your tests are completely normal, you will receive your results only by: MyChart Message (if you have MyChart) OR A paper copy in the mail If you have any lab test that is abnormal or we need to change your treatment, we will call you to review the results.   Testing/Procedures: NONE   Follow-Up: At [facility], you and your health needs are our priority.  As part of our continuing mission to provide you with exceptional heart care, we have created designated Provider Care Teams.  These Care Teams include your primary Cardiologist (physician) and Advanced Practice Providers (APPs -  Physician Assistants and Nurse Practitioners) who all work together to provide you with the care you need, when you need it.  We recommend signing up for the patient portal called "MyChart".  Sign up information is provided on this After Visit Summary.  MyChart is used to connect with patients for Virtual Visits (Telemedicine).  Patients are able to view lab/test results, encounter notes, upcoming appointments, etc.  Non-urgent messages can be sent to your provider as well.   To learn more about what you can do with MyChart, go to [email or web address].    Your next appointment:   1 year(s)  Provider:   Kristeen Miss, MD

## 2022-08-04 LAB — LIPID PANEL
Chol/HDL Ratio: 3.3 ratio (ref 0.0–4.4)
Cholesterol, Total: 117 mg/dL (ref 100–199)
HDL: 36 mg/dL — ABNORMAL LOW (ref >39)
LDL Chol Calc (NIH): 50 mg/dL (ref 0–99)
Triglycerides: 185 mg/dL — ABNORMAL HIGH (ref 0–149)
VLDL Cholesterol Cal: 31 mg/dL (ref 5–40)

## 2022-08-04 LAB — ALT: ALT: 15 IU/L (ref 0–32)

## 2022-12-08 DIAGNOSIS — H04123 Dry eye syndrome of bilateral lacrimal glands: Secondary | ICD-10-CM | POA: Diagnosis not present

## 2022-12-08 DIAGNOSIS — H3552 Pigmentary retinal dystrophy: Secondary | ICD-10-CM | POA: Diagnosis not present

## 2022-12-08 DIAGNOSIS — H35373 Puckering of macula, bilateral: Secondary | ICD-10-CM | POA: Diagnosis not present

## 2022-12-08 DIAGNOSIS — Z961 Presence of intraocular lens: Secondary | ICD-10-CM | POA: Diagnosis not present

## 2022-12-14 DIAGNOSIS — I130 Hypertensive heart and chronic kidney disease with heart failure and stage 1 through stage 4 chronic kidney disease, or unspecified chronic kidney disease: Secondary | ICD-10-CM | POA: Diagnosis not present

## 2022-12-14 DIAGNOSIS — Z23 Encounter for immunization: Secondary | ICD-10-CM | POA: Diagnosis not present

## 2022-12-14 DIAGNOSIS — I2510 Atherosclerotic heart disease of native coronary artery without angina pectoris: Secondary | ICD-10-CM | POA: Diagnosis not present

## 2022-12-14 DIAGNOSIS — E1129 Type 2 diabetes mellitus with other diabetic kidney complication: Secondary | ICD-10-CM | POA: Diagnosis not present

## 2022-12-16 DIAGNOSIS — M25512 Pain in left shoulder: Secondary | ICD-10-CM | POA: Diagnosis not present

## 2022-12-16 DIAGNOSIS — M7552 Bursitis of left shoulder: Secondary | ICD-10-CM | POA: Diagnosis not present

## 2022-12-16 DIAGNOSIS — M7542 Impingement syndrome of left shoulder: Secondary | ICD-10-CM | POA: Diagnosis not present

## 2023-01-16 DIAGNOSIS — M7552 Bursitis of left shoulder: Secondary | ICD-10-CM | POA: Diagnosis not present

## 2023-02-20 DIAGNOSIS — L570 Actinic keratosis: Secondary | ICD-10-CM | POA: Diagnosis not present

## 2023-02-20 DIAGNOSIS — Z85828 Personal history of other malignant neoplasm of skin: Secondary | ICD-10-CM | POA: Diagnosis not present

## 2023-02-20 DIAGNOSIS — Z85820 Personal history of malignant melanoma of skin: Secondary | ICD-10-CM | POA: Diagnosis not present

## 2023-02-20 DIAGNOSIS — D1801 Hemangioma of skin and subcutaneous tissue: Secondary | ICD-10-CM | POA: Diagnosis not present

## 2023-02-20 DIAGNOSIS — C4441 Basal cell carcinoma of skin of scalp and neck: Secondary | ICD-10-CM | POA: Diagnosis not present

## 2023-02-20 DIAGNOSIS — L821 Other seborrheic keratosis: Secondary | ICD-10-CM | POA: Diagnosis not present

## 2023-04-28 DIAGNOSIS — H16203 Unspecified keratoconjunctivitis, bilateral: Secondary | ICD-10-CM | POA: Diagnosis not present

## 2023-04-28 DIAGNOSIS — H5711 Ocular pain, right eye: Secondary | ICD-10-CM | POA: Diagnosis not present

## 2023-05-05 DIAGNOSIS — H16103 Unspecified superficial keratitis, bilateral: Secondary | ICD-10-CM | POA: Diagnosis not present

## 2023-06-04 ENCOUNTER — Other Ambulatory Visit: Payer: Self-pay | Admitting: Cardiovascular Disease

## 2023-06-08 DIAGNOSIS — H04123 Dry eye syndrome of bilateral lacrimal glands: Secondary | ICD-10-CM | POA: Diagnosis not present

## 2023-06-08 DIAGNOSIS — H3552 Pigmentary retinal dystrophy: Secondary | ICD-10-CM | POA: Diagnosis not present

## 2023-06-19 DIAGNOSIS — E1140 Type 2 diabetes mellitus with diabetic neuropathy, unspecified: Secondary | ICD-10-CM | POA: Diagnosis not present

## 2023-06-19 DIAGNOSIS — E7800 Pure hypercholesterolemia, unspecified: Secondary | ICD-10-CM | POA: Diagnosis not present

## 2023-06-19 DIAGNOSIS — E785 Hyperlipidemia, unspecified: Secondary | ICD-10-CM | POA: Diagnosis not present

## 2023-06-19 DIAGNOSIS — M810 Age-related osteoporosis without current pathological fracture: Secondary | ICD-10-CM | POA: Diagnosis not present

## 2023-06-26 DIAGNOSIS — Z1339 Encounter for screening examination for other mental health and behavioral disorders: Secondary | ICD-10-CM | POA: Diagnosis not present

## 2023-06-26 DIAGNOSIS — Z0000 Encounter for general adult medical examination without abnormal findings: Secondary | ICD-10-CM | POA: Diagnosis not present

## 2023-06-26 DIAGNOSIS — Z1331 Encounter for screening for depression: Secondary | ICD-10-CM | POA: Diagnosis not present

## 2023-06-26 DIAGNOSIS — I130 Hypertensive heart and chronic kidney disease with heart failure and stage 1 through stage 4 chronic kidney disease, or unspecified chronic kidney disease: Secondary | ICD-10-CM | POA: Diagnosis not present

## 2023-06-26 DIAGNOSIS — E1129 Type 2 diabetes mellitus with other diabetic kidney complication: Secondary | ICD-10-CM | POA: Diagnosis not present

## 2023-06-26 DIAGNOSIS — R82998 Other abnormal findings in urine: Secondary | ICD-10-CM | POA: Diagnosis not present

## 2023-07-06 DIAGNOSIS — H04123 Dry eye syndrome of bilateral lacrimal glands: Secondary | ICD-10-CM | POA: Diagnosis not present

## 2023-07-27 DIAGNOSIS — Z1231 Encounter for screening mammogram for malignant neoplasm of breast: Secondary | ICD-10-CM | POA: Diagnosis not present

## 2023-08-23 DIAGNOSIS — H10503 Unspecified blepharoconjunctivitis, bilateral: Secondary | ICD-10-CM | POA: Diagnosis not present

## 2023-09-01 ENCOUNTER — Other Ambulatory Visit: Payer: Self-pay | Admitting: Cardiovascular Disease

## 2023-11-03 ENCOUNTER — Encounter: Payer: Self-pay | Admitting: Internal Medicine

## 2023-11-03 ENCOUNTER — Ambulatory Visit: Attending: Internal Medicine | Admitting: Internal Medicine

## 2023-11-03 VITALS — BP 125/73 | HR 78 | Ht 66.93 in | Wt 152.0 lb

## 2023-11-03 DIAGNOSIS — I5032 Chronic diastolic (congestive) heart failure: Secondary | ICD-10-CM

## 2023-11-03 DIAGNOSIS — R296 Repeated falls: Secondary | ICD-10-CM

## 2023-11-03 DIAGNOSIS — I10 Essential (primary) hypertension: Secondary | ICD-10-CM

## 2023-11-03 DIAGNOSIS — I700 Atherosclerosis of aorta: Secondary | ICD-10-CM

## 2023-11-03 DIAGNOSIS — R931 Abnormal findings on diagnostic imaging of heart and coronary circulation: Secondary | ICD-10-CM | POA: Diagnosis not present

## 2023-11-03 DIAGNOSIS — E782 Mixed hyperlipidemia: Secondary | ICD-10-CM

## 2023-11-03 NOTE — Progress Notes (Signed)
 Cardiology Office Note   Date:  11/03/2023  ID:  Adreana, Coull [birth year], MRN [id number] PCP: Vernadine Charlie ORN, MD  [facility] Providers Cardiologist:  Emeline FORBES Calender, MD     History of Present Illness Angela Rosario is a 83 y.o. 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  She states that she has frequent falls due to poor peripheral vision and has gotten rid of her small rugs and tripping hazards in the house the best she can.  Otherwise she denies any chest pain, shortness of breath or any exertional symptoms.  No orthopnea or lower extremity edema.  Occasionally gets lightheaded when she stands up too quick.  Tobacco use: No Alcohol  use: No     ROS:  Review of Systems  All other systems reviewed and are negative.   Physical Exam  Physical Exam Vitals and nursing note reviewed.  Constitutional:      Appearance: Normal appearance.  HENT:     Head: Normocephalic and atraumatic.  Eyes:     Conjunctiva/sclera: Conjunctivae normal.  Neck:     Vascular: No carotid bruit.  Cardiovascular:     Rate and Rhythm: Normal rate and regular rhythm.  Pulmonary:     Effort: Pulmonary effort is normal.     Breath sounds: Normal breath sounds.  Musculoskeletal:        General: No swelling or tenderness.  Skin:    Coloration: Skin is not jaundiced or pale.  Neurological:     Mental Status: She is alert.     VS:  BP 125/73 (BP Location: Left Arm, Patient Position: Sitting, Cuff Size: Large)   Pulse 78   Ht 5' 6.93 (1.7 m)   Wt 152 lb (68.9 kg)   SpO2 94%   BMI 23.86 kg/m         Wt Readings from Last 3 Encounters:  11/03/23 152 lb (68.9 kg)  08/03/22 161 lb (73 kg)  11/22/21 165 lb (74.8 kg)     EKG Interpretation Date/Time:  Friday  November 03 2023 10:57:07 EDT Ventricular Rate:  79 PR Interval:  206 QRS Duration:  94 QT Interval:  390 QTC Calculation: 447 R Axis:   -48  Text Interpretation: Normal sinus rhythm Left anterior fasicular block Incomplete right bundle branch block When compared with ECG of 01-Apr-2016 15:12, Criteria for Septal infarct are no longer Present Confirmed by Calender Emeline [phone]) on 11/03/2023 10:59:19 AM    Studies Reviewed   Coronary calcium  score 04/07/2022: 216 (129 in LAD and 87 LCx) Aortic atherosclerosis  Echocardiogram 09/29/2021 admission hospital: EF 55 to 60% with no regional wall motion abnormalities and normal LV wall thickness Grade 1 diastolic dysfunction  Nuclear myocardial perfusion 09/29/2021 at [facility]: Normal and low risk   Risk Assessment/Calculations              ASSESSMENT  Coronary artery calcification with elevated coronary calcium  score calcium  score 216 in 2024.  Ideally would be on a baby aspirin since score is greater than 100 however given her frequent falls we will hold off on starting at this time.  Patient is in agreement.  Current on Lipitor 40 mg Chronic diastolic heart failure, euvolemic Hypertension stable.  On losartan  25 mg  Hyperlipidemia lipid panel from outside facility 06/19/2023: Total cholesterol 101, triglycerides 63, HDL 36,  LDL 52 Diabetes HA1C 06/19/2023: 6.8 on Glyxambi  Frequent fall due to poor peripheral vision   Plan  Ideally patient would be on an aspirin 81 mg daily however given her frequent falls we will hold off at this time.  Patient is in agreement after discussion. Advised to get up slowly and increase her water  intake  Cardiac risk counseling and prevention recommendations: Heart healthy/Mediterranean diet with whole grains, fruits, vegetable, fish, lean meats, nuts, and olive oil.  Moderate walking, 3-5 times/week for 30-50 minutes each session. Aim for at least 150 minutes.week. Goal should be pace of 3  miles/hour, or walking 1.5 miles in 30 minutes Avoidance of tobacco products. Avoid excess alcohol .  Follow up: 1 year          Signed, Emeline FORBES Calender, MD

## 2023-11-03 NOTE — Patient Instructions (Signed)
 Medication Instructions:  No medication changes were made at this visit. Continue current regimen.   *If you need a refill on your cardiac medications before your next appointment, please call your pharmacy*  Lab Work: None ordered today. If you have labs (blood work) drawn today and your tests are completely normal, you will receive your results only by: MyChart Message (if you have MyChart) OR A paper copy in the mail If you have any lab test that is abnormal or we need to change your treatment, we will call you to review the results.  Testing/Procedures: None ordered today.  Follow-Up: At [facility], you and your health needs are our priority.  As part of our continuing mission to provide you with exceptional heart care, our providers are all part of one team.  This team includes your primary Cardiologist (physician) and Advanced Practice Providers or APPs (Physician Assistants and Nurse Practitioners) who all work together to provide you with the care you need, when you need it.  Your next appointment:   1 year(s)  Provider:   Dr. Kriste

## 2023-11-20 DIAGNOSIS — D0439 Carcinoma in situ of skin of other parts of face: Secondary | ICD-10-CM | POA: Diagnosis not present

## 2023-11-20 DIAGNOSIS — C44319 Basal cell carcinoma of skin of other parts of face: Secondary | ICD-10-CM | POA: Diagnosis not present

## 2023-11-24 DIAGNOSIS — M542 Cervicalgia: Secondary | ICD-10-CM | POA: Diagnosis not present

## 2023-11-30 DIAGNOSIS — Z961 Presence of intraocular lens: Secondary | ICD-10-CM | POA: Diagnosis not present

## 2023-11-30 DIAGNOSIS — H04123 Dry eye syndrome of bilateral lacrimal glands: Secondary | ICD-10-CM | POA: Diagnosis not present

## 2023-11-30 DIAGNOSIS — H3552 Pigmentary retinal dystrophy: Secondary | ICD-10-CM | POA: Diagnosis not present

## 2023-11-30 DIAGNOSIS — H35353 Cystoid macular degeneration, bilateral: Secondary | ICD-10-CM | POA: Diagnosis not present

## 2023-12-07 DIAGNOSIS — C44319 Basal cell carcinoma of skin of other parts of face: Secondary | ICD-10-CM | POA: Diagnosis not present

## 2023-12-07 DIAGNOSIS — D0439 Carcinoma in situ of skin of other parts of face: Secondary | ICD-10-CM | POA: Diagnosis not present

## 2024-01-04 DIAGNOSIS — I130 Hypertensive heart and chronic kidney disease with heart failure and stage 1 through stage 4 chronic kidney disease, or unspecified chronic kidney disease: Secondary | ICD-10-CM | POA: Diagnosis not present

## 2024-01-04 DIAGNOSIS — E1122 Type 2 diabetes mellitus with diabetic chronic kidney disease: Secondary | ICD-10-CM | POA: Diagnosis not present
